# Patient Record
Sex: Female | Born: 1950 | Race: White | Hispanic: No | Marital: Single | Smoking: Former smoker
Health system: Southern US, Community
[De-identification: ages and names within clinical notes are randomized; demographics above are authoritative.]

## PROBLEM LIST (undated history)

## (undated) DIAGNOSIS — M51369 Other intervertebral disc degeneration, lumbar region without mention of lumbar back pain or lower extremity pain: Secondary | ICD-10-CM

## (undated) DIAGNOSIS — E669 Obesity, unspecified: Secondary | ICD-10-CM

## (undated) DIAGNOSIS — R319 Hematuria, unspecified: Secondary | ICD-10-CM

## (undated) DIAGNOSIS — I10 Essential (primary) hypertension: Secondary | ICD-10-CM

## (undated) DIAGNOSIS — K5730 Diverticulosis of large intestine without perforation or abscess without bleeding: Secondary | ICD-10-CM

## (undated) DIAGNOSIS — C801 Malignant (primary) neoplasm, unspecified: Secondary | ICD-10-CM

## (undated) DIAGNOSIS — R7303 Prediabetes: Secondary | ICD-10-CM

## (undated) DIAGNOSIS — N3946 Mixed incontinence: Secondary | ICD-10-CM

## (undated) DIAGNOSIS — G4733 Obstructive sleep apnea (adult) (pediatric): Secondary | ICD-10-CM

## (undated) DIAGNOSIS — M1990 Unspecified osteoarthritis, unspecified site: Secondary | ICD-10-CM

## (undated) HISTORY — PX: OTHER SURGICAL HISTORY: SHX169

## (undated) HISTORY — PX: KNEE ARTHROSCOPY: SHX127

## (undated) HISTORY — DX: Obesity, unspecified: E66.9

## (undated) HISTORY — PX: NOSE SURGERY: SHX723

## (undated) HISTORY — DX: Hematuria, unspecified: R31.9

## (undated) HISTORY — DX: Mixed incontinence: N39.46

## (undated) HISTORY — PX: TOE SURGERY: SHX1073

## (undated) HISTORY — DX: Other intervertebral disc degeneration, lumbar region without mention of lumbar back pain or lower extremity pain: M51.369

## (undated) HISTORY — DX: Obstructive sleep apnea (adult) (pediatric): G47.33

## (undated) HISTORY — DX: Essential (primary) hypertension: I10

## (undated) HISTORY — PX: COLONOSCOPY: SHX174

## (undated) HISTORY — PX: HAND TENDON SURGERY: SHX663

## (undated) HISTORY — DX: Diverticulosis of large intestine without perforation or abscess without bleeding: K57.30

## (undated) HISTORY — PX: POLYPECTOMY: SHX149

## (undated) HISTORY — DX: Unspecified osteoarthritis, unspecified site: M19.90

---
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
## 1999-09-18 ENCOUNTER — Encounter: Admission: RE | Admit: 1999-09-18 | Discharge: 1999-09-18 | Payer: Self-pay | Admitting: Family Medicine

## 1999-09-18 ENCOUNTER — Encounter: Payer: Self-pay | Admitting: Family Medicine

## 2000-11-22 ENCOUNTER — Ambulatory Visit (HOSPITAL_BASED_OUTPATIENT_CLINIC_OR_DEPARTMENT_OTHER): Admission: RE | Admit: 2000-11-22 | Discharge: 2000-11-22 | Payer: Self-pay | Admitting: *Deleted

## 2001-03-07 ENCOUNTER — Other Ambulatory Visit: Admission: RE | Admit: 2001-03-07 | Discharge: 2001-03-07 | Payer: Self-pay | Admitting: Otolaryngology

## 2005-12-17 ENCOUNTER — Ambulatory Visit: Payer: Self-pay | Admitting: Gastroenterology

## 2005-12-23 ENCOUNTER — Ambulatory Visit: Payer: Self-pay | Admitting: Gastroenterology

## 2008-08-10 HISTORY — PX: ANKLE FUSION WITH GASTROC SLIDE: SHX5585

## 2008-11-13 ENCOUNTER — Encounter (INDEPENDENT_AMBULATORY_CARE_PROVIDER_SITE_OTHER): Payer: Self-pay | Admitting: *Deleted

## 2011-01-19 ENCOUNTER — Encounter: Payer: Self-pay | Admitting: Gastroenterology

## 2011-01-19 ENCOUNTER — Ambulatory Visit (AMBULATORY_SURGERY_CENTER): Payer: BC Managed Care – PPO | Admitting: *Deleted

## 2011-01-19 VITALS — Ht 67.0 in | Wt 300.0 lb

## 2011-01-19 DIAGNOSIS — Z86010 Personal history of colonic polyps: Secondary | ICD-10-CM

## 2011-01-19 MED ORDER — PEG-KCL-NACL-NASULF-NA ASC-C 100 G PO SOLR: ORAL | Status: DC

## 2011-01-28 ENCOUNTER — Ambulatory Visit (AMBULATORY_SURGERY_CENTER): Payer: BC Managed Care – PPO | Admitting: Gastroenterology

## 2011-01-28 ENCOUNTER — Encounter: Payer: Self-pay | Admitting: Gastroenterology

## 2011-01-28 VITALS — HR 66 | Temp 98.6°F | Resp 18 | Ht 67.0 in | Wt 290.0 lb

## 2011-01-28 DIAGNOSIS — K5730 Diverticulosis of large intestine without perforation or abscess without bleeding: Secondary | ICD-10-CM | POA: Insufficient documentation

## 2011-01-28 DIAGNOSIS — Z860100 Personal history of colon polyps, unspecified: Secondary | ICD-10-CM | POA: Insufficient documentation

## 2011-01-28 MED ORDER — SODIUM CHLORIDE 0.9 % IV SOLN: 500.0000 mL | INTRAVENOUS | Status: DC

## 2011-01-28 NOTE — Patient Instructions (Signed)
Please refer to blue and green discharge instruction sheets. 

## 2011-01-29 ENCOUNTER — Telehealth: Payer: Self-pay | Admitting: *Deleted

## 2011-01-29 NOTE — Telephone Encounter (Signed)

## 2013-05-18 ENCOUNTER — Other Ambulatory Visit: Payer: Self-pay | Admitting: Dermatology

## 2013-08-01 ENCOUNTER — Ambulatory Visit (HOSPITAL_BASED_OUTPATIENT_CLINIC_OR_DEPARTMENT_OTHER): Payer: BC Managed Care – PPO | Attending: Family Medicine | Admitting: Radiology

## 2013-08-01 VITALS — Ht 67.0 in | Wt 293.0 lb

## 2013-08-01 DIAGNOSIS — R5383 Other fatigue: Secondary | ICD-10-CM

## 2013-08-01 DIAGNOSIS — R0683 Snoring: Secondary | ICD-10-CM

## 2013-08-01 DIAGNOSIS — R0989 Other specified symptoms and signs involving the circulatory and respiratory systems: Secondary | ICD-10-CM | POA: Insufficient documentation

## 2013-08-01 DIAGNOSIS — G4733 Obstructive sleep apnea (adult) (pediatric): Secondary | ICD-10-CM | POA: Insufficient documentation

## 2013-08-01 DIAGNOSIS — R0609 Other forms of dyspnea: Secondary | ICD-10-CM | POA: Insufficient documentation

## 2013-08-05 DIAGNOSIS — G4733 Obstructive sleep apnea (adult) (pediatric): Secondary | ICD-10-CM

## 2013-08-05 DIAGNOSIS — R0609 Other forms of dyspnea: Secondary | ICD-10-CM

## 2013-08-05 DIAGNOSIS — R5381 Other malaise: Secondary | ICD-10-CM

## 2013-08-05 DIAGNOSIS — R5383 Other fatigue: Secondary | ICD-10-CM

## 2013-08-05 DIAGNOSIS — R0989 Other specified symptoms and signs involving the circulatory and respiratory systems: Secondary | ICD-10-CM

## 2013-08-05 NOTE — Sleep Study (Signed)
   NAME: Susan Frederick DATE OF BIRTH:  April 03, 1951 MEDICAL RECORD NUMBER [id number]  LOCATION: [facility]  PHYSICIAN: Anthany Thornhill D  DATE OF STUDY: 08/01/2013  SLEEP STUDY TYPE: Nocturnal Polysomnogram               REFERRING PHYSICIAN: Eartha Inch, MD  INDICATION FOR STUDY: Hypersomnia with sleep apnea   EPWORTH SLEEPINESS SCORE:   HEIGHT: 5\' 7"  (170.2 cm)  WEIGHT: 132.904 kg (293 lb)    Body mass index is 45.88 kg/(m^2).  NECK SIZE: 15 in.  MEDICATIONS: Charted for review  SLEEP ARCHITECTURE: Total sleep time 177.5 minutes with sleep efficiency 45.7%. Stage I was 7.9%, stage II 92.1%, stage is 3 and REM were absent. She slept for 30 minutes between 12:30 and 1 AM but otherwise was awake until sustained sleep finally achieved around 2:45 AM. Sleep latency 103.5 minutes, awake after sleep onset 107.5 minutes, arousal index 40.2. Bedtime medication: None.  RESPIRATORY DATA:  Apnea hypopnea index (AHI) 50 per hour. A total of 148 events were scored including 85 obstructive apneas and 63 hypopneas. All events were nonsupine. Because of markedly delayed sleep onset, protocol CPAP titration could not be applied.  OXYGEN DATA: Snoring ranged from moderate to very loud with oxygen desaturation to NAD or at 75% and mean oxygen saturation through the study of 90.6% on room air.  CARDIAC DATA: Sinus rhythm with PACs and PVCs  MOVEMENT/PARASOMNIA: No significant movement disturbance. Bathroom x2. The technician described the patient as restless through the night, with movement that did not meet criteria to be scored as significant limb movement.  IMPRESSION/ RECOMMENDATION:    1) Restless with difficulty initiating and maintaining sleep. Sustained sleep was not achieved until 2:45 AM. No bedtime medication taken.  2) Severe obstructive sleep apnea/hypopnea syndrome, AHI 50 per hour with mainly nonsupine events area moderate to sometimes very loud snoring with oxygen  desaturation to a nadir of 75% and mean oxygen saturation through the study of      90.6% on room air. 3) Because of the lack of sufficient early sleep and events, split CPAP protocol did not apply. If appropriate, consider return for dedicated CPAP titration study. 4) A previous nocturnal polysomnogram on 11/22/2000 recorded AHI of 13 per hour with body weight 225 pounds for that study.  Signed Jetty Duhamel M.D. Waymon Budge Diplomate, American Board of Sleep Medicine  ELECTRONICALLY SIGNED ON:  08/05/2013, 11:41 AM [facility] PH: [phone]   FX: [phone] ACCREDITED BY THE AMERICAN ACADEMY OF SLEEP MEDICINE

## 2013-09-28 ENCOUNTER — Ambulatory Visit (HOSPITAL_BASED_OUTPATIENT_CLINIC_OR_DEPARTMENT_OTHER): Payer: BC Managed Care – PPO | Attending: Family Medicine

## 2013-09-28 VITALS — Ht 67.0 in | Wt 278.0 lb

## 2013-09-28 DIAGNOSIS — R0989 Other specified symptoms and signs involving the circulatory and respiratory systems: Secondary | ICD-10-CM | POA: Insufficient documentation

## 2013-09-28 DIAGNOSIS — R5381 Other malaise: Secondary | ICD-10-CM | POA: Insufficient documentation

## 2013-09-28 DIAGNOSIS — E669 Obesity, unspecified: Secondary | ICD-10-CM | POA: Insufficient documentation

## 2013-09-28 DIAGNOSIS — R0609 Other forms of dyspnea: Secondary | ICD-10-CM | POA: Insufficient documentation

## 2013-09-28 DIAGNOSIS — R5383 Other fatigue: Secondary | ICD-10-CM

## 2013-09-28 DIAGNOSIS — Z9989 Dependence on other enabling machines and devices: Secondary | ICD-10-CM

## 2013-09-28 DIAGNOSIS — I10 Essential (primary) hypertension: Secondary | ICD-10-CM | POA: Insufficient documentation

## 2013-09-28 DIAGNOSIS — G4733 Obstructive sleep apnea (adult) (pediatric): Secondary | ICD-10-CM | POA: Insufficient documentation

## 2013-09-30 DIAGNOSIS — G4733 Obstructive sleep apnea (adult) (pediatric): Secondary | ICD-10-CM

## 2013-09-30 NOTE — Sleep Study (Signed)
   NAME: Susan OaksCecelia J Frederick DATE OF BIRTH:  1950/12/03 MEDICAL RECORD NUMBER [id number]  LOCATION: [facility]  PHYSICIAN: Deanne Bedgood D  DATE OF STUDY: 09/28/2013  SLEEP STUDY TYPE: Nocturnal Polysomnogram               REFERRING PHYSICIAN: Eartha InchBadger, Michael C, MD  INDICATION FOR STUDY: Hypersomnia with sleep apnea-CPAP titration  EPWORTH SLEEPINESS SCORE:   5/24 HEIGHT: 5\' 7"  (170.2 cm)  WEIGHT: 278 lb (126.1 kg)    Body mass index is 43.53 kg/(m^2).  NECK SIZE: 15 in.  MEDICATIONS: Charted for review  SLEEP ARCHITECTURE: Total sleep time 163 minutes with sleep efficiency 44.8%. Stage I was 22.4%, stage II 71.8%, stage III 0.9%, REM 4.9% of total sleep time. Sleep latency 54.5 minutes, REM latency 226 minutes, awake after sleep onset 140 minutes, arousal index 23.6, bedtime medication: Melatonin  RESPIRATORY DATA: CPAP titration protocol. CPAP was titrated to 10 CWP, AHI 4 per hour. She wore a standard [employer] nasal pillows mask with heated humidifier and an EPR of 3  OXYGEN DATA: Mild snoring, prevented at the final pressure with mean oxygen saturation 94.4% on room air  CARDIAC DATA: Sinus rhythm with PACs  MOVEMENT/PARASOMNIA: No significant movement disturbance, bathroom x1  IMPRESSION/ RECOMMENDATION:   1) Sleep onset was delayed until 1 AM, and the technician described the patient as very restless. Melatonin 3 mg was taken at 10 PM. Management for an insomnia component may be helpful in the home environment, especially while adjusting to CPAP. 2) Successful CPAP titration to 10 CWP, AHI 4 per hour. She wore a standard [employer] Pillairo nasal pillows mask with heated humidifier and an EPR of 3. Snoring was prevented and mean oxygen saturation of 94.4% on room air. 3) Baseline polysomnogram on 08/01/2013 recorded AHI of 50 per hour with body weight 293 pounds.  Signed Jetty Duhamellinton Merek Niu M.D. Waymon BudgeYOUNG,Coralee Edberg D Diplomate, American Board of Sleep  Medicine  ELECTRONICALLY SIGNED ON:  09/30/2013, 11:27 AM [facility] PH: [phone]   FX: [phone] ACCREDITED BY THE AMERICAN ACADEMY OF SLEEP MEDICINE

## 2013-11-01 ENCOUNTER — Ambulatory Visit (HOSPITAL_BASED_OUTPATIENT_CLINIC_OR_DEPARTMENT_OTHER): Payer: BC Managed Care – PPO

## 2014-10-25 ENCOUNTER — Other Ambulatory Visit: Payer: Self-pay | Admitting: Dermatology

## 2015-02-19 ENCOUNTER — Encounter: Payer: Self-pay | Admitting: Gastroenterology

## 2015-05-24 ENCOUNTER — Other Ambulatory Visit: Payer: Self-pay | Admitting: Neurosurgery

## 2015-05-24 DIAGNOSIS — S32010A Wedge compression fracture of first lumbar vertebra, initial encounter for closed fracture: Secondary | ICD-10-CM

## 2015-06-08 ENCOUNTER — Ambulatory Visit
Admission: RE | Admit: 2015-06-08 | Discharge: 2015-06-08 | Disposition: A | Discharge status: Home / Self Care | Payer: BC Managed Care – PPO | Source: Ambulatory Visit | Attending: Neurosurgery | Admitting: Neurosurgery

## 2015-06-08 DIAGNOSIS — S32010A Wedge compression fracture of first lumbar vertebra, initial encounter for closed fracture: Secondary | ICD-10-CM

## 2016-09-10 ENCOUNTER — Encounter (HOSPITAL_COMMUNITY): Payer: Self-pay

## 2016-09-10 ENCOUNTER — Emergency Department (HOSPITAL_COMMUNITY)
Admission: EM | Admit: 2016-09-10 | Discharge: 2016-09-10 | Disposition: A | Discharge status: Home / Self Care | Payer: Medicare Other | Attending: Emergency Medicine | Admitting: Emergency Medicine

## 2016-09-10 ENCOUNTER — Emergency Department (HOSPITAL_COMMUNITY): Payer: Medicare Other

## 2016-09-10 DIAGNOSIS — Z7982 Long term (current) use of aspirin: Secondary | ICD-10-CM | POA: Diagnosis not present

## 2016-09-10 DIAGNOSIS — Z87891 Personal history of nicotine dependence: Secondary | ICD-10-CM | POA: Diagnosis not present

## 2016-09-10 DIAGNOSIS — I10 Essential (primary) hypertension: Secondary | ICD-10-CM | POA: Insufficient documentation

## 2016-09-10 DIAGNOSIS — R072 Precordial pain: Secondary | ICD-10-CM | POA: Diagnosis present

## 2016-09-10 DIAGNOSIS — R079 Chest pain, unspecified: Secondary | ICD-10-CM

## 2016-09-10 LAB — CBC WITH DIFFERENTIAL/PLATELET
Basophils Absolute: 0 10*3/uL (ref 0.0–0.1)
Basophils Relative: 0 %
EOS ABS: 0 10*3/uL (ref 0.0–0.7)
EOS PCT: 0 %
HCT: 41.4 % (ref 36.0–46.0)
Hemoglobin: 13.7 g/dL (ref 12.0–15.0)
LYMPHS ABS: 1.7 10*3/uL (ref 0.7–4.0)
Lymphocytes Relative: 17 %
MCH: 30.2 pg (ref 26.0–34.0)
MCHC: 33.1 g/dL (ref 30.0–36.0)
MCV: 91.2 fL (ref 78.0–100.0)
MONOS PCT: 4 %
Monocytes Absolute: 0.4 10*3/uL (ref 0.1–1.0)
Neutro Abs: 7.7 10*3/uL (ref 1.7–7.7)
Neutrophils Relative %: 79 %
PLATELETS: 211 10*3/uL (ref 150–400)
RBC: 4.54 MIL/uL (ref 3.87–5.11)
RDW: 13 % (ref 11.5–15.5)
WBC: 9.8 10*3/uL (ref 4.0–10.5)

## 2016-09-10 LAB — BASIC METABOLIC PANEL
Anion gap: 12 (ref 5–15)
BUN: 12 mg/dL (ref 6–20)
CHLORIDE: 104 mmol/L (ref 101–111)
CO2: 21 mmol/L — ABNORMAL LOW (ref 22–32)
CREATININE: 0.72 mg/dL (ref 0.44–1.00)
Calcium: 9.2 mg/dL (ref 8.9–10.3)
GFR calc Af Amer: >60 mL/min (ref >60)
GFR calc non Af Amer: >60 mL/min (ref >60)
GLUCOSE: 116 mg/dL — AB (ref 65–99)
Potassium: 4 mmol/L (ref 3.5–5.1)
SODIUM: 137 mmol/L (ref 135–145)

## 2016-09-10 LAB — I-STAT TROPONIN, ED: TROPONIN I, POC: 0 ng/mL (ref 0.00–0.08)

## 2016-09-10 NOTE — ED Notes (Signed)
EDP at bedside  

## 2016-09-10 NOTE — Discharge Instructions (Signed)
Chest pain work up here was reassuring. No evidence of heart attack today. Please follow up with your primary care provider.

## 2016-09-10 NOTE — ED Provider Notes (Signed)
[facility]EMER[facility]ENCY DEPT Provider Note   CSN: [id number] Arrival date & time: 09/10/16  1436     History   Chief Complaint Chief Complaint  Patient presents with  . Chest Pain    HPI Susan Frederick is a 66 y.o. female.  Susan Frederick is a 66 y.o. Female who presents to the ED complaining of chest pain for the past six days. Patient reports substernal and left sided non-radiating chest pain constant for the past 6 days that is worse with coughing and touching her chest. She denies any associated shortness of breath or palpitations. Her pain is not worse with exertion. She reports she has had a cough over the past several days but this has been improving. She went to her family medicine provider today who did an EKG and gave her 325 mg of aspirin. She was directed to the emergency department from her family medicine office. Patient does report a family history of her father having an MI prior to the age of 80. She does have a history of hypertension. No personal history of MI, PE or DVT. Patient denies fevers, shortness of breath, palpitations, leg pain, leg swelling, lightheadedness, syncope, headaches, numbness, tingling, weakness, abdominal pain, nausea, or vomiting.   The history is provided by the patient and medical records. No language interpreter was used.  Chest Pain   Associated symptoms include cough. Pertinent negatives include no abdominal pain, no back pain, no dizziness, no fever, no headaches, no nausea, no numbness, no palpitations, no shortness of breath, no vomiting and no weakness.    Past Medical History:  Diagnosis Date  . Arthritis   . Hypertension     Patient Active Problem List   Diagnosis Date Noted  . Personal history of colonic polyps 01/28/2011  . Diverticulosis of colon (without mention of hemorrhage) 01/28/2011    Past Surgical History:  Procedure Laterality Date  . COLONOSCOPY    . diviated    . HAND TENDON SURGERY     right  . KNEE ARTHROSCOPY     left  . NOSE SURGERY     deviated septum  . POLYPECTOMY    . TOE SURGERY     right foot/ little toe  . WISDOM TOOTH EXTRACTION  1969    OB History    No data available       Home Medications    Prior to Admission medications   Medication Sig Start Date End Date Taking? Authorizing Provider  aspirin 81 MG tablet Take 81 mg by mouth daily.     Yes Historical Provider, MD  lisinopril (PRINIVIL,ZESTRIL) 20 MG tablet Take 20 mg by mouth daily.     Yes Historical Provider, MD  Multiple Vitamins-Minerals (MULTIVITAMIN WITH MINERALS) tablet Take 1 tablet by mouth daily.     Yes Historical Provider, MD  Multiple Vitamins-Minerals (PRESERVISION AREDS 2) CAPS Take 1 capsule by mouth every evening.   Yes Historical Provider, MD  sertraline (ZOLOFT) 50 MG tablet Take 50 mg by mouth daily.     Yes Historical Provider, MD  TURMERIC CURCUMIN PO Take 1 tablet by mouth daily as needed (for inflammation).    Yes Historical Provider, MD    Family History History reviewed. No pertinent family history.  Social History Social History  Substance Use Topics  . Smoking status: Former Games developer  . Smokeless tobacco: Never Used  . Alcohol use 0.6 oz/week    1 Glasses of wine per week     Allergies  Patient has no known allergies.   Review of Systems Review of Systems  Constitutional: Negative for chills and fever.  HENT: Positive for postnasal drip and rhinorrhea. Negative for congestion and sore throat.   Eyes: Negative for visual disturbance.  Respiratory: Positive for cough. Negative for chest tightness, shortness of breath and wheezing.   Cardiovascular: Positive for chest pain. Negative for palpitations and leg swelling.  Gastrointestinal: Negative for abdominal pain, nausea and vomiting.  Genitourinary: Negative for dysuria.  Musculoskeletal: Negative for back pain and neck pain.  Skin: Negative for rash.  Neurological: Negative for dizziness, syncope, weakness, light-headedness,  numbness and headaches.     Physical Exam Updated Vital Signs BP 143/55 (BP Location: Left Arm)   Pulse 70   Temp 98 F (36.7 C) (Oral)   Resp 21   Wt 126.1 kg   SpO2 99%   BMI 43.54 kg/m   Physical Exam  Constitutional: She appears well-developed and well-nourished. No distress.  Obese female. Nontoxic.  HENT:  Head: Normocephalic and atraumatic.  Mouth/Throat: Oropharynx is clear and moist.  Eyes: Conjunctivae are normal. Pupils are equal, round, and reactive to light. Right eye exhibits no discharge. Left eye exhibits no discharge.  Neck: Neck supple. No JVD present.  Cardiovascular: Normal rate, regular rhythm, normal heart sounds and intact distal pulses.   Bilateral radial, posterior tibialis and dorsalis pedis pulses are intact.    Pulmonary/Chest: Effort normal and breath sounds normal. No stridor. No respiratory distress. She has no wheezes. She has no rales. She exhibits tenderness.  Substernal chest is tender to palpation reproduces her chest pain.  Abdominal: Soft. There is no tenderness. There is no guarding.  Musculoskeletal: Normal range of motion. She exhibits no edema or tenderness.  No LE edema or tenderness to palpation.   Lymphadenopathy:    She has no cervical adenopathy.  Neurological: She is alert. Coordination normal.  Skin: Skin is warm and dry. Capillary refill takes less than 2 seconds. No rash noted. She is not diaphoretic. No erythema. No pallor.  Psychiatric: She has a normal mood and affect. Her behavior is normal.  Nursing note and vitals reviewed.    ED Treatments / Results  Labs (all labs ordered are listed, but only abnormal results are displayed) Labs Reviewed  BASIC METABOLIC PANEL - Abnormal; Notable for the following:       Result Value   CO2 21 (*)    Glucose, Bld 116 (*)    All other components within normal limits  CBC WITH DIFFERENTIAL/PLATELET  I-STAT TROPOININ, ED    EKG  EKG Interpretation  Date/Time:  Thursday  September 10 2016 14:48:12 EST Ventricular Rate:  62 PR Interval:    QRS Duration: 94 QT Interval:  395 QTC Calculation: 402 R Axis:   45 Text Interpretation:  Sinus rhythm Probable anteroseptal infarct, old no prior aviailable for comparison Confirmed by Lincoln Brigham [phone]) on 09/10/2016 3:06:37 PM       Radiology Dg Chest 2 View  Result Date: 09/10/2016 CLINICAL DATA:  Chest pain for 6 days. EXAM: CHEST  2 VIEW COMPARISON:  None available. FINDINGS: Cardiomediastinal silhouette is normal. Mediastinal contours appear intact. There is no evidence of focal airspace consolidation, pleural effusion or pneumothorax. Osseous structures are without acute abnormality. Mild osteoarthritic changes of the thoracic spine. Soft tissues are grossly normal. IMPRESSION: No active cardiopulmonary disease. Electronically Signed   By: Ted Mcalpine M.D.   On: 09/10/2016 17:53    Procedures Procedures (including critical care  time)  Medications Ordered in ED Medications - No data to display   Initial Impression / Assessment and Plan / ED Course  I have reviewed the triage vital signs and the nursing notes.  Pertinent labs & imaging results that were available during my care of the patient were reviewed by me and considered in my medical decision making (see chart for details).    This  is a 66 y.o. Female who presents to the ED complaining of chest pain for the past six days. Patient reports substernal and left sided non-radiating chest pain constant for the past 6 days that is worse with coughing and touching her chest. She denies any associated shortness of breath or palpitations. Her pain is not worse with exertion. She reports she has had a cough over the past several days but this has been improving. She went to her family medicine provider today who did an EKG and gave her 325 mg of aspirin. She was directed to the emergency department from her family medicine office. Patient presented with chest  pain to the ED. Patient is to be discharged with recommendation to follow up with PCP in regards to today's hospital visit. Chest pain is not likely of cardiac or pulmonary etiology due to presentation, VSS, no tracheal deviation, no JVD or new murmur, RRR, breath sounds equal bilaterally, EKG without acute abnormalities, negative troponin, and negative CXR. I have low suspicion for ACS, or PE. I see no need for delta troponin as the patient has had pain for 6 days now.  Patient has been advised to return to the ED if chest pain becomes exertional, associated with diaphoresis or nausea, radiates to left jaw/arm, worsens or becomes concerning in any way. Patient appears reliable for follow up and is agreeable to discharge. I advised the patient to follow-up with their primary care provider this week. I advised the patient to return to the emergency department with new or worsening symptoms or new concerns. The patient verbalized understanding and agreement with plan.    This patient was discussed with Dr. Madilyn Hookees who agrees with assessment and plan.      Final Clinical Impressions(s) / ED Diagnoses   Final diagnoses:  Nonspecific chest pain    New Prescriptions New Prescriptions   No medications on file     Everlene FarrierWilliam Kenya Shiraishi, PA-C 09/10/16 1849    Tilden FossaElizabeth Rees, MD 09/12/16 1444

## 2016-09-10 NOTE — ED Triage Notes (Signed)
Pt. Coming from urgent care via [facility] for chest pain x6 days. Pt. Rates pain 4/10 and worse with palpation and cough. Pt. Family cardiac hx. Pt. Given 324 ASA by urgent care. EMS reports 12 lead unremarkable.

## 2016-10-26 IMAGING — MR MR LUMBAR SPINE W/O CM
5 series · 33 of 48 positions shown · non-contrast
Comparison: Lumbar radiographs 05/01/2015 and 05/22/2015.

CLINICAL DATA: Several week history of low back pain with BILATERAL
buttock pain after a fall from a ladder. Initial encounter.

EXAM:
MRI LUMBAR SPINE WITHOUT CONTRAST
TECHNIQUE: Multiplanar, multisequence MR imaging of the lumbar spine was
performed. No intravenous contrast was administered.

[Series 4: T2 · sagittal · 4.0mm · 0.49mm/px · 5 of 12 slices shown (1 of 2)]
[im 1/12]
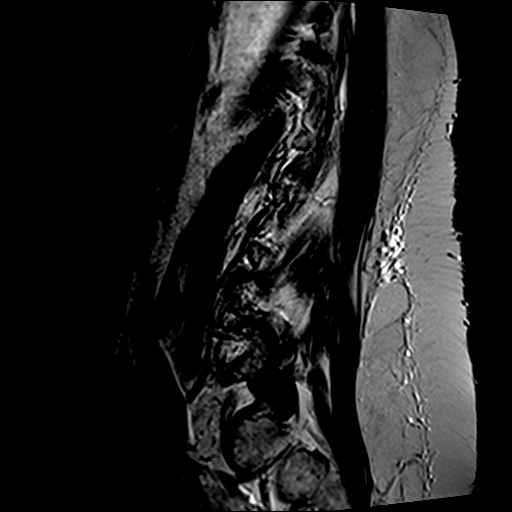
[im 3/12]
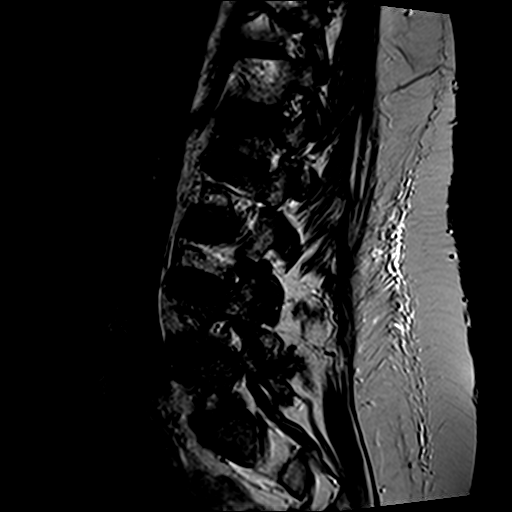
[im 6/12]
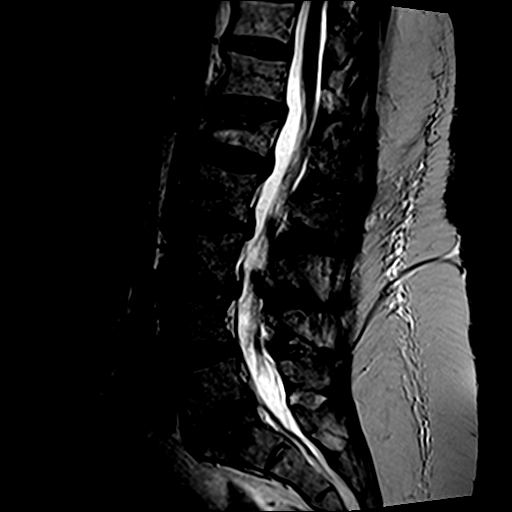
[im 9/12]
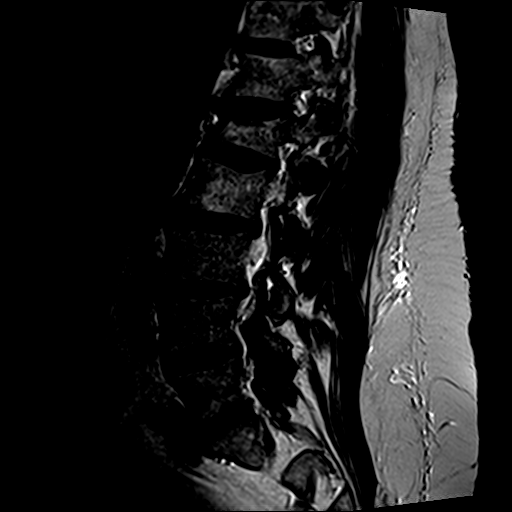
[im 12/12]
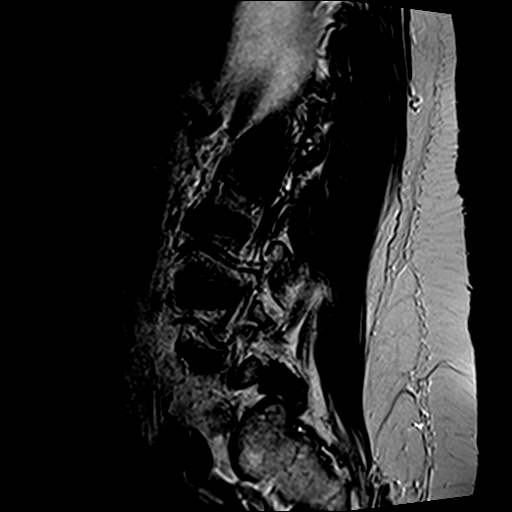

[Series 6: T1 · sagittal · 4.0mm · 0.49mm/px · 5 of 12 slices shown (1 of 2)]
[im 1/12]
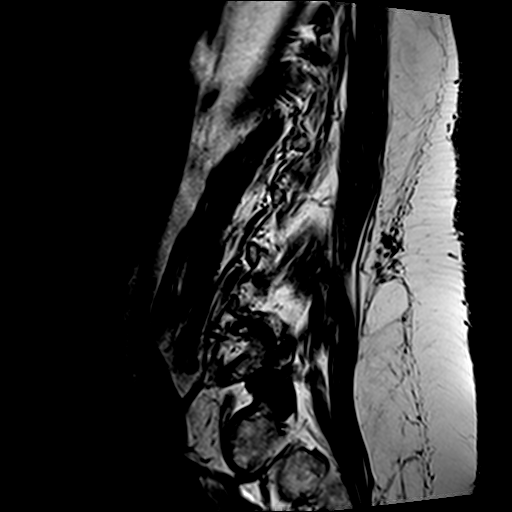
[im 3/12]
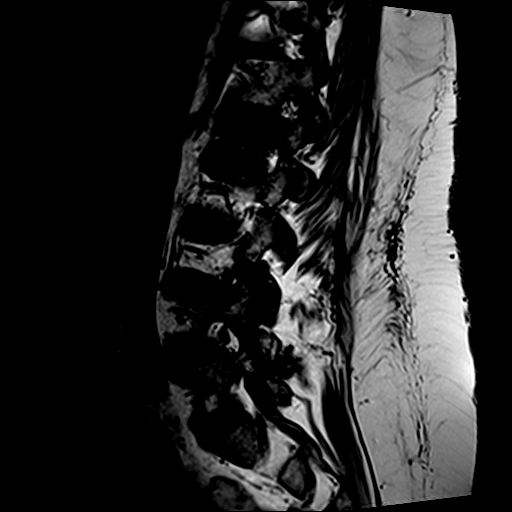
[im 6/12]
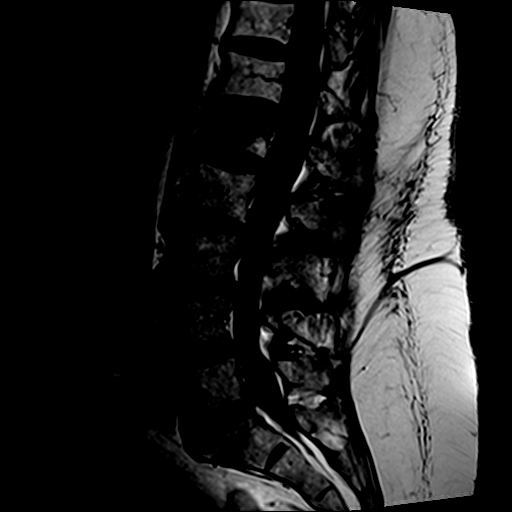
[im 9/12]
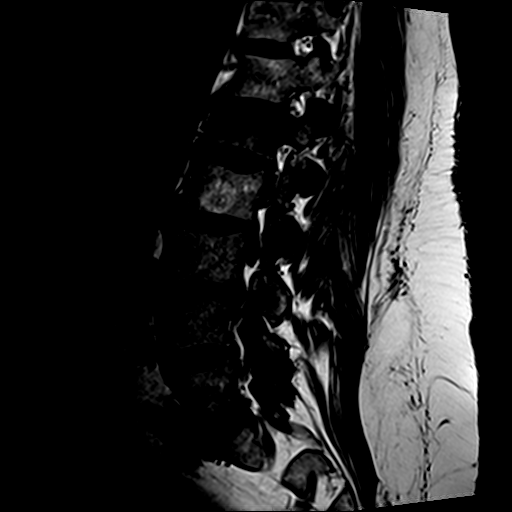
[im 12/12]
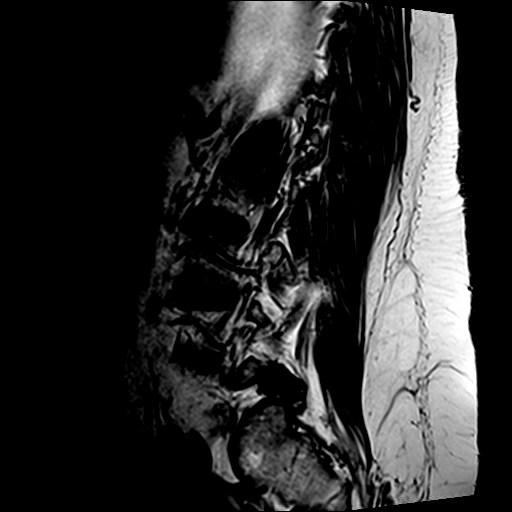

[Series 8: STIR · sagittal · 4.0mm · 0.49mm/px · 3 of 12 slices shown]
[im 1/12]
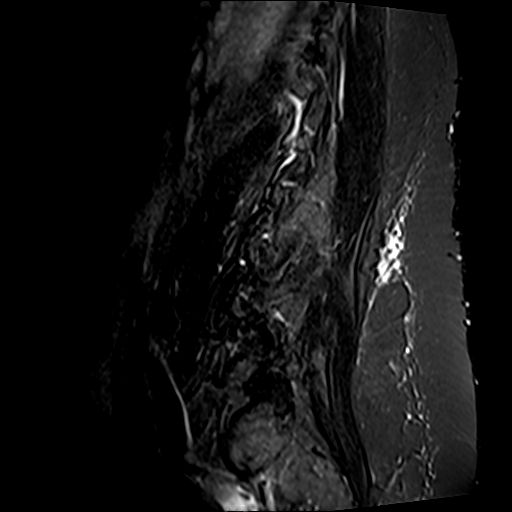
[im 3/12]
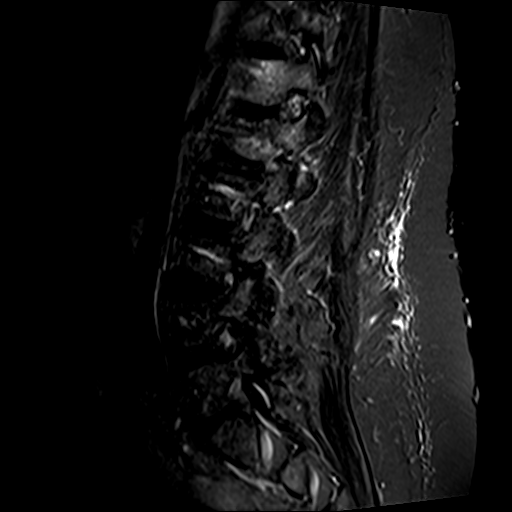
[im 5/12]
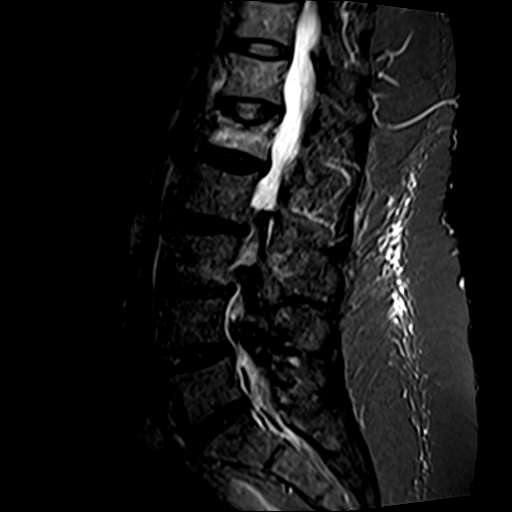

[Series 9: T2 · axial · 4.0mm · 0.74mm/px · z∈[-88,+89]mm · 10 of 34 slices shown (2 of 2)]
[im 3/34]
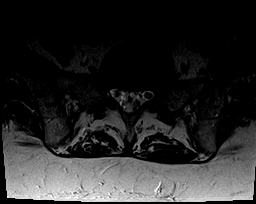
[im 5/34]
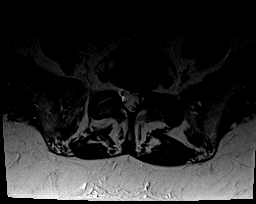
[im 7/34]
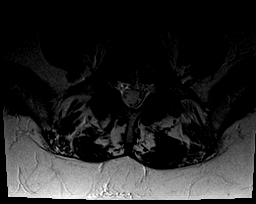
[im 12/34]
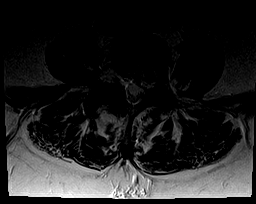
[im 16/34]
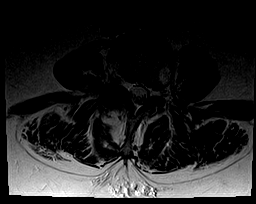
[im 18/34]
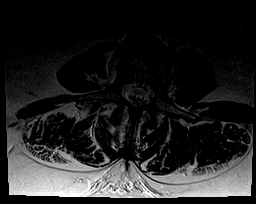
[im 20/34]
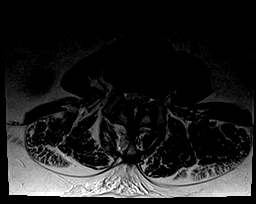
[im 25/34]
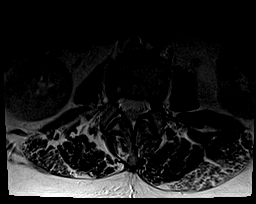
[im 29/34]
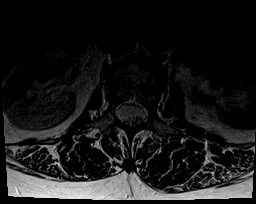
[im 34/34]
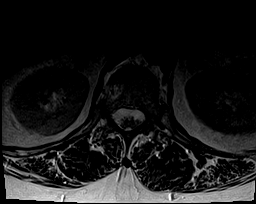

[Series 10: T1 · axial · 4.0mm · 0.74mm/px · z∈[-88,+89]mm · 10 of 34 slices shown (2 of 2)]
[im 3/34]
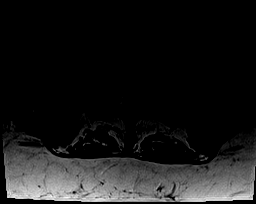
[im 5/34]
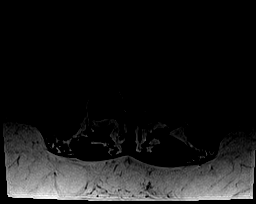
[im 7/34]
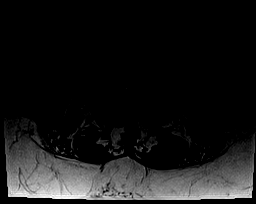
[im 12/34]
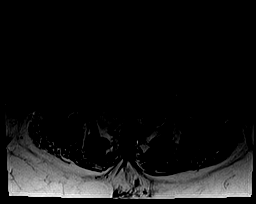
[im 16/34]
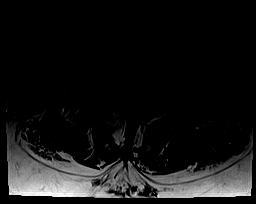
[im 18/34]
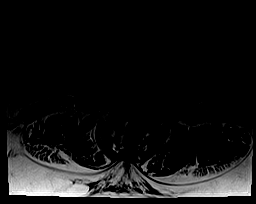
[im 20/34]
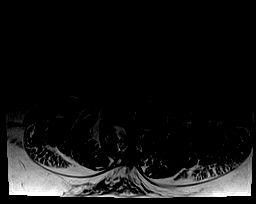
[im 25/34]
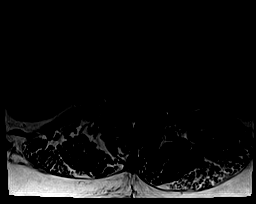
[im 29/34]
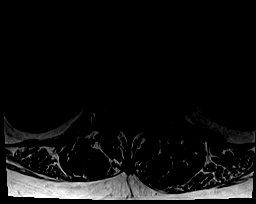
[im 34/34]
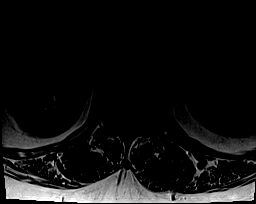

[33 of 48 positions shown; findings below may reference images not displayed]

FINDINGS: Segmentation: Normal.

Alignment:  2 mm anterolisthesis L4-5 is facet mediated.

Vertebrae: There is an acute to subacute compression fracture of L1,
with moderate bone marrow edema, loss of 50% vertebral body height
anteriorly and superiorly. Superior endplate depression. Trace
retropulsion of bone. No retroperitoneal hematoma. No features to
suggest pathologic fracture. Pedicles intact.

Conus medullaris: Normal in size, signal, and location.

Paraspinal tissues: No evidence for hydronephrosis or paravertebral
mass.

Disc levels:

T12-L1: Mild bulge. Slight superior posterior L1 endplate
displacement without significant stenosis or impingement.

L1-L2:  Shallow central protrusion.  No impingement.

L2-L3: Shallow central protrusion. Posterior element hypertrophy. No
impingement.

L3-L4: Mild bulge. Foraminal protrusion on the RIGHT. Asymmetric
facet arthropathy also on the RIGHT. RIGHT L3 nerve root impingement
is observed.

L4-L5: 2 mm anterolisthesis is facet mediated. Mild annular bulging.
BILATERAL facet arthropathy. No definite subarticular zone or
foraminal zone narrowing.

L5-S1: Central and leftward protrusion. LEFT-sided facet
arthropathy. LEFT subarticular zone narrowing potentially affects
the LEFT S1 nerve root.

Compared with prior plain films, there is slight progression
compared with 05/01/2015.
IMPRESSION: Acute to subacute L1 benign osteopenic compression fracture, with
loss of 50% vertebrae height, and minimal posterior displacement,
without significant stenosis.

RIGHT L3-4 foraminal disc protrusion and LEFT L5-S1 subarticular
disc protrusion as described above.

2 mm of facet mediated slip at L4-5 with mild annular bulging, but
no definite stenosis or neural impingement.

## 2018-01-29 IMAGING — DX DG CHEST 2V
2 series · 2 of 2 positions shown · non-contrast
Comparison: None available.

CLINICAL DATA: Chest pain for 6 days.

EXAM:
CHEST  2 VIEW

[chest pa]
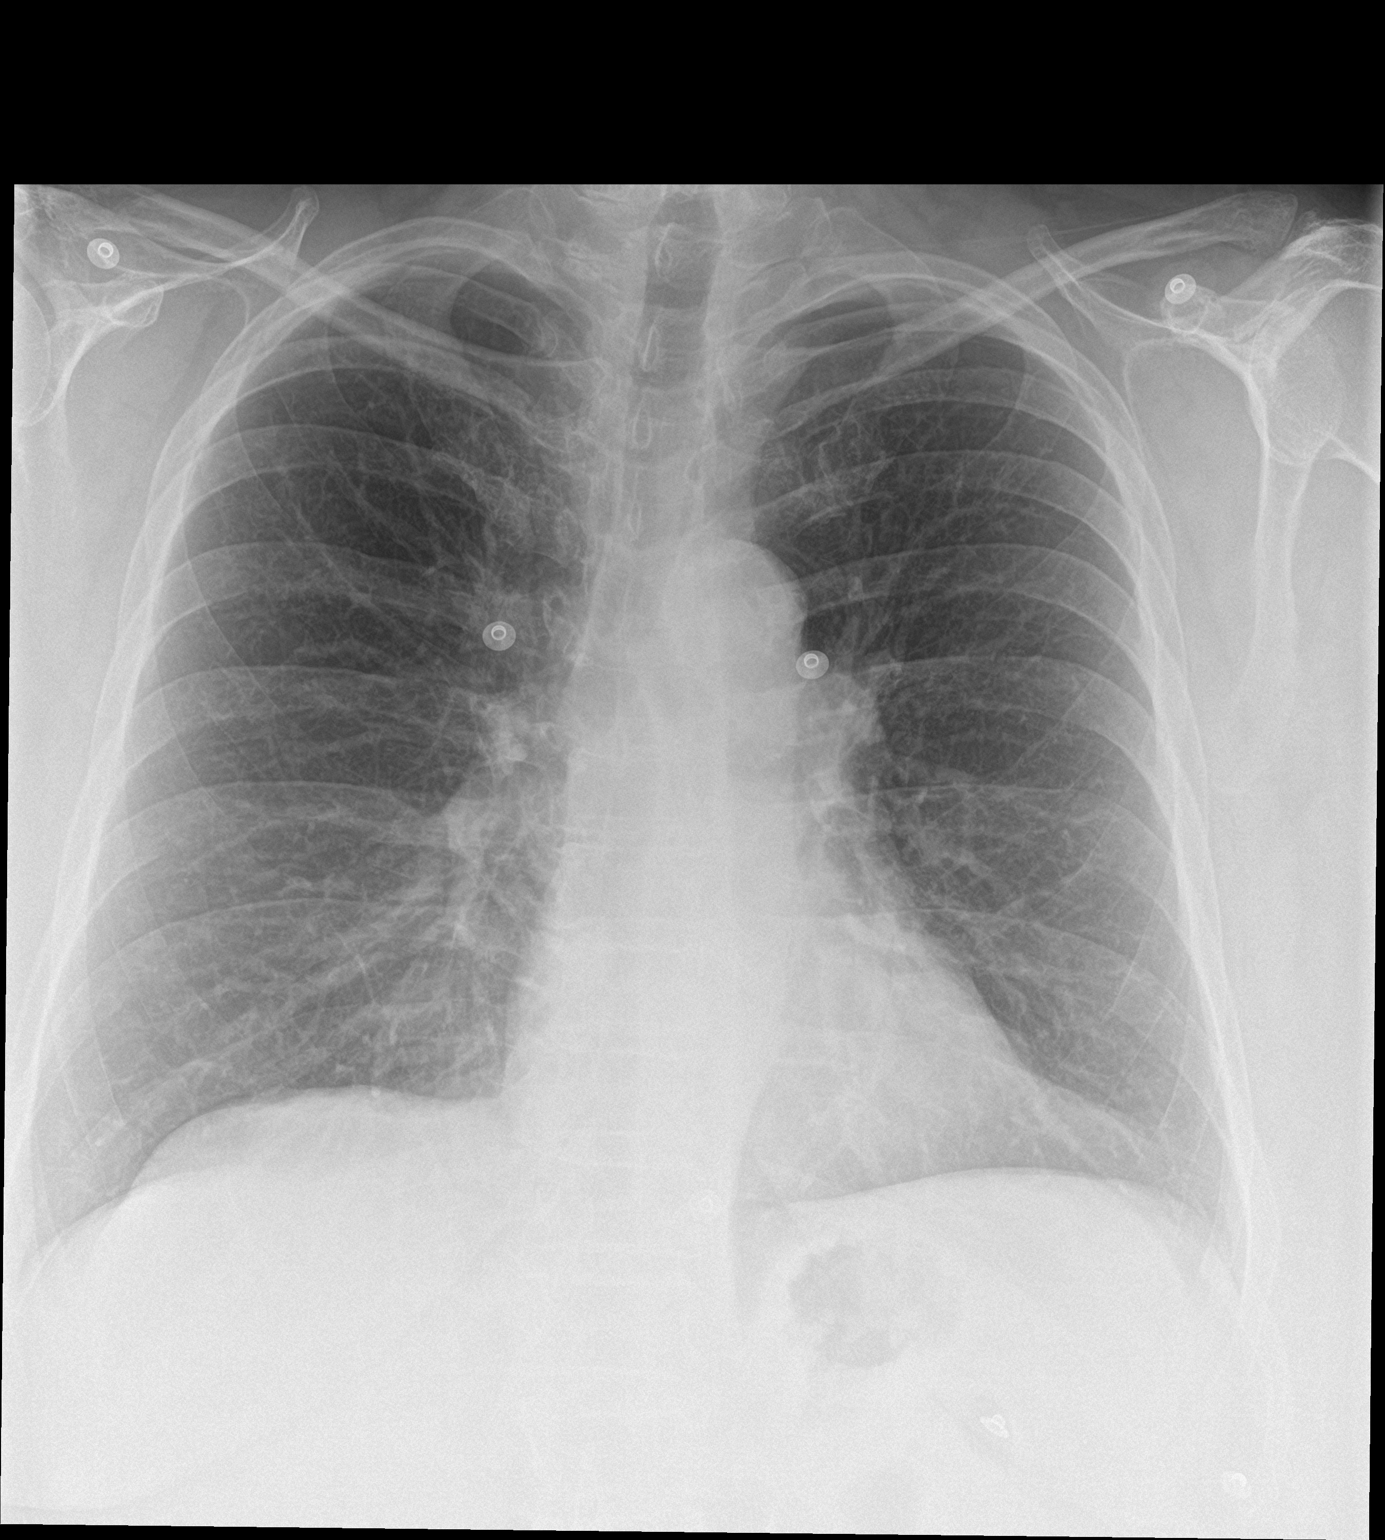

[chest lat]
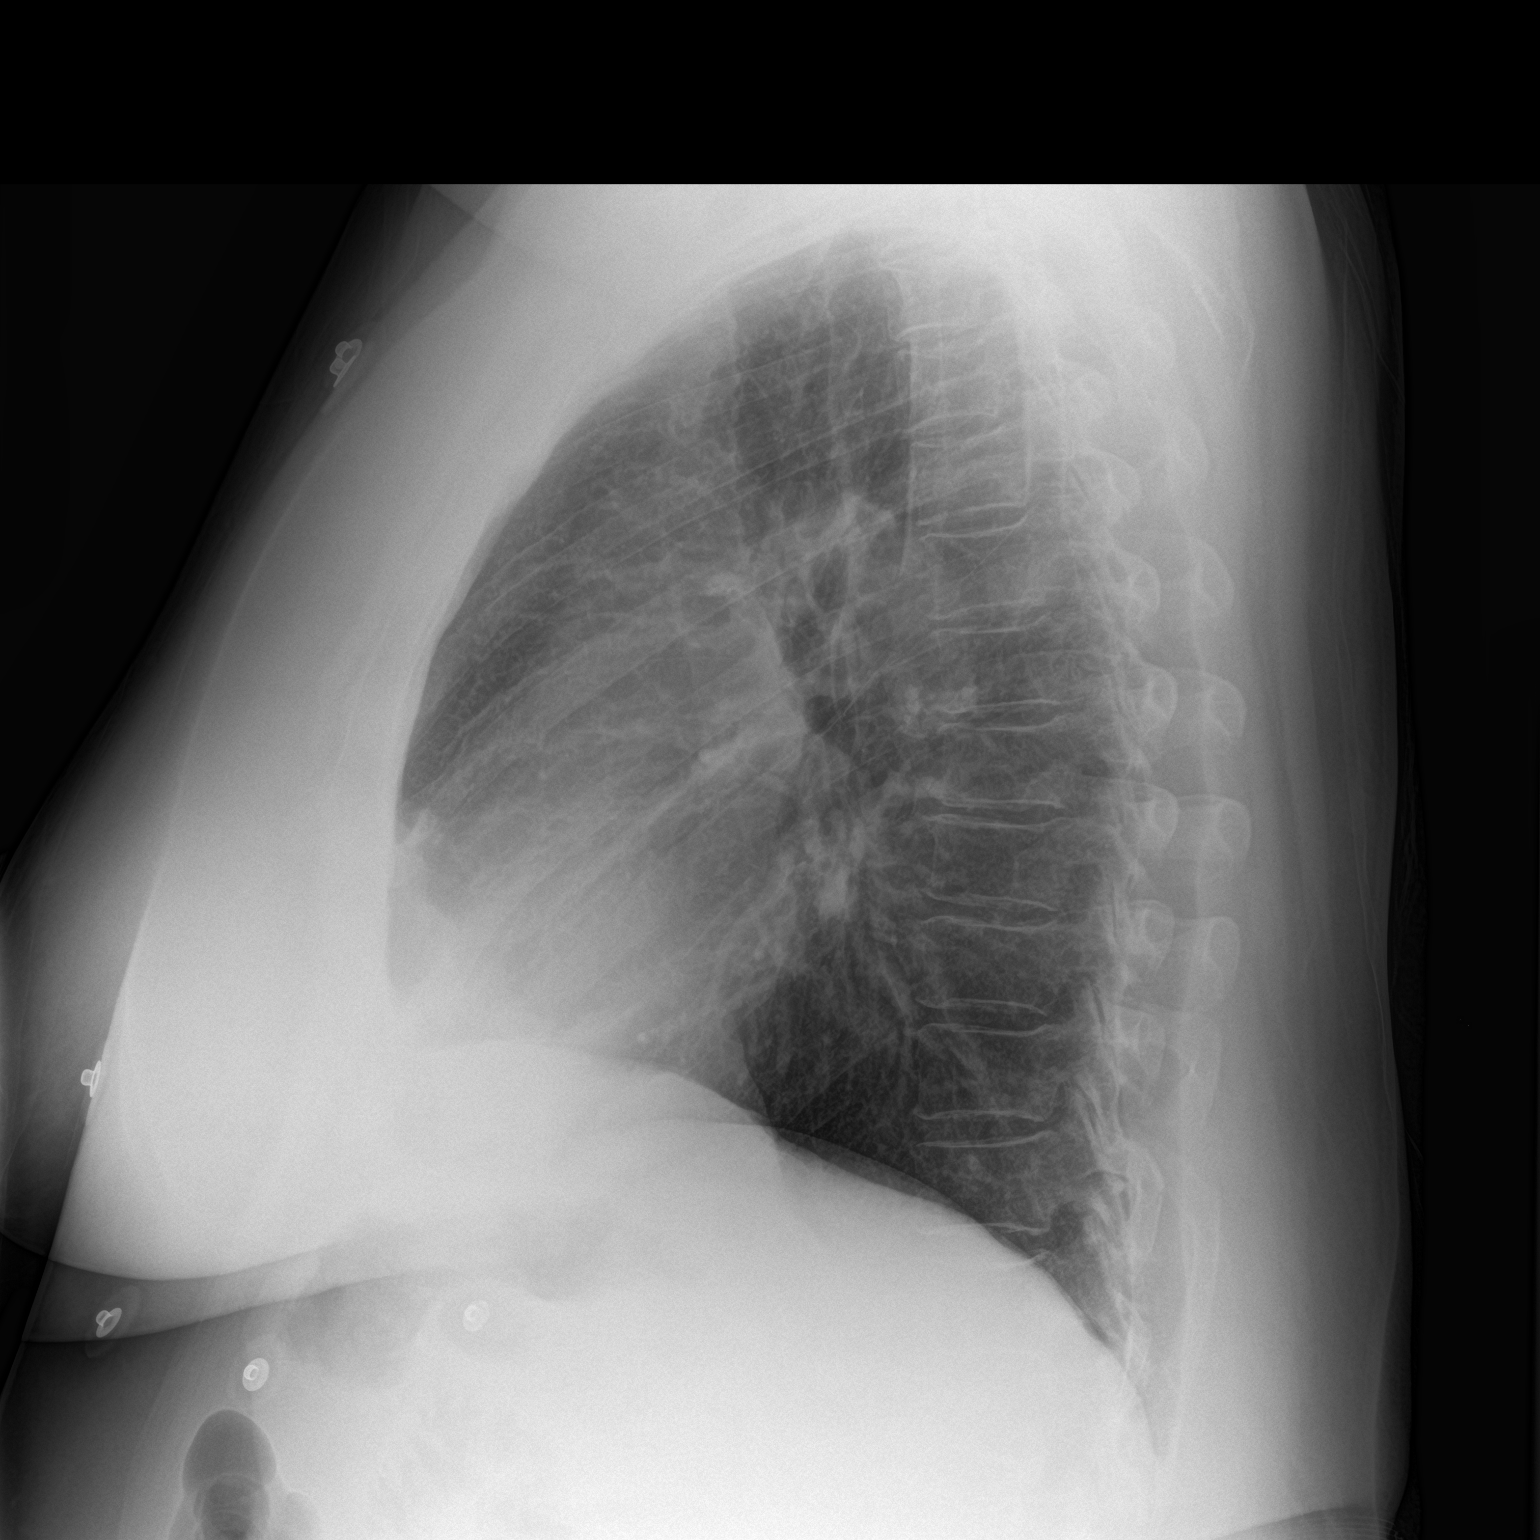

[2 of 2 positions shown; findings below may reference images not displayed]

FINDINGS: Cardiomediastinal silhouette is normal. Mediastinal contours appear
intact.

There is no evidence of focal airspace consolidation, pleural
effusion or pneumothorax.

Osseous structures are without acute abnormality. Mild
osteoarthritic changes of the thoracic spine. Soft tissues are
grossly normal.
IMPRESSION: No active cardiopulmonary disease.

## 2019-09-04 ENCOUNTER — Ambulatory Visit: Payer: Medicare PPO | Attending: Internal Medicine

## 2019-09-04 DIAGNOSIS — Z23 Encounter for immunization: Secondary | ICD-10-CM

## 2019-09-04 NOTE — Progress Notes (Signed)
   Covid-19 Vaccination Clinic  Name:  Susan Frederick    MRN: [id number] DOB: Mar 24, 1951  09/04/2019  [name] was observed post Covid-19 immunization for 15 minutes without incidence. She was provided with Vaccine Information Sheet and instruction to access the V-Safe system.   [name] was instructed to call 911 with any severe reactions post vaccine: Marland Kitchen Difficulty breathing  . Swelling of your face and throat  . A fast heartbeat  . A bad rash all over your body  . Dizziness and weakness    Immunizations Administered    Name Date Dose VIS Date Route   Pfizer COVID-19 Vaccine 09/04/2019  1:59 PM 0.3 mL 07/21/2019 Intramuscular   Manufacturer: [employer], [employer]   Lot: [id number]   NDC: [address]-6

## 2019-09-06 ENCOUNTER — Ambulatory Visit: Payer: Medicare Other

## 2019-09-25 ENCOUNTER — Ambulatory Visit: Payer: Medicare PPO | Attending: Internal Medicine

## 2019-09-25 DIAGNOSIS — Z23 Encounter for immunization: Secondary | ICD-10-CM

## 2019-09-25 NOTE — Progress Notes (Signed)
   Covid-19 Vaccination Clinic  Name:  Susan Frederick    MRN: [id number] DOB: 1951/01/16  09/25/2019  Susan Frederick was observed post Covid-19 immunization for 15 minutes without incidence. She was provided with Vaccine Information Sheet and instruction to access the V-Safe system.   Susan Frederick was instructed to call 911 with any severe reactions post vaccine: Marland Kitchen Difficulty breathing  . Swelling of your face and throat  . A fast heartbeat  . A bad rash all over your body  . Dizziness and weakness    Immunizations Administered    Name Date Dose VIS Date Route   Pfizer COVID-19 Vaccine 09/25/2019 12:23 PM 0.3 mL 07/21/2019 Intramuscular   Manufacturer: [employer], [employer]   Lot: [id number]   NDC: [address]-6

## 2021-05-13 ENCOUNTER — Encounter: Payer: Self-pay | Admitting: Gastroenterology

## 2022-07-27 LAB — COLOGUARD: COLOGUARD: NEGATIVE

## 2023-08-11 HISTORY — PX: ROOT CANAL: SHX2363

## 2024-01-07 ENCOUNTER — Telehealth: Payer: Self-pay | Admitting: *Deleted

## 2024-01-07 NOTE — Telephone Encounter (Signed)
 Call from PCP office of Dr. El Gravely at [facility]. Needs urgent referral to medical oncology for abdominal pain and malignant neoplasm--recent PET and CT scan confirms. Confirmed patient's contact information and forwarded referral to navigator.

## 2024-01-10 ENCOUNTER — Inpatient Hospital Stay
Admission: RE | Admit: 2024-01-10 | Discharge: 2024-01-10 | Disposition: A | Discharge status: Home / Self Care | Payer: Self-pay | Source: Ambulatory Visit | Attending: Physician Assistant | Admitting: Physician Assistant

## 2024-01-10 ENCOUNTER — Encounter: Payer: Self-pay | Admitting: Medical Oncology

## 2024-01-10 DIAGNOSIS — C801 Malignant (primary) neoplasm, unspecified: Secondary | ICD-10-CM

## 2024-01-11 ENCOUNTER — Inpatient Hospital Stay: Attending: Physician Assistant | Admitting: Physician Assistant

## 2024-01-11 ENCOUNTER — Encounter: Payer: Self-pay | Admitting: Physician Assistant

## 2024-01-11 ENCOUNTER — Encounter: Payer: Self-pay | Admitting: Medical Oncology

## 2024-01-11 ENCOUNTER — Inpatient Hospital Stay

## 2024-01-11 VITALS — BP 133/53 | HR 82 | Temp 97.9°F | Resp 18 | Ht 67.0 in | Wt 318.1 lb

## 2024-01-11 DIAGNOSIS — Z5111 Encounter for antineoplastic chemotherapy: Secondary | ICD-10-CM | POA: Diagnosis present

## 2024-01-11 DIAGNOSIS — R109 Unspecified abdominal pain: Secondary | ICD-10-CM | POA: Diagnosis not present

## 2024-01-11 DIAGNOSIS — G4733 Obstructive sleep apnea (adult) (pediatric): Secondary | ICD-10-CM | POA: Diagnosis not present

## 2024-01-11 DIAGNOSIS — Z801 Family history of malignant neoplasm of trachea, bronchus and lung: Secondary | ICD-10-CM | POA: Insufficient documentation

## 2024-01-11 DIAGNOSIS — I10 Essential (primary) hypertension: Secondary | ICD-10-CM | POA: Insufficient documentation

## 2024-01-11 DIAGNOSIS — Z800 Family history of malignant neoplasm of digestive organs: Secondary | ICD-10-CM | POA: Diagnosis not present

## 2024-01-11 DIAGNOSIS — N3946 Mixed incontinence: Secondary | ICD-10-CM | POA: Diagnosis not present

## 2024-01-11 DIAGNOSIS — Z87891 Personal history of nicotine dependence: Secondary | ICD-10-CM | POA: Insufficient documentation

## 2024-01-11 DIAGNOSIS — K5900 Constipation, unspecified: Secondary | ICD-10-CM | POA: Insufficient documentation

## 2024-01-11 DIAGNOSIS — M51369 Other intervertebral disc degeneration, lumbar region without mention of lumbar back pain or lower extremity pain: Secondary | ICD-10-CM | POA: Diagnosis not present

## 2024-01-11 DIAGNOSIS — E669 Obesity, unspecified: Secondary | ICD-10-CM | POA: Diagnosis not present

## 2024-01-11 DIAGNOSIS — R140 Abdominal distension (gaseous): Secondary | ICD-10-CM | POA: Diagnosis not present

## 2024-01-11 DIAGNOSIS — M1990 Unspecified osteoarthritis, unspecified site: Secondary | ICD-10-CM | POA: Diagnosis not present

## 2024-01-11 DIAGNOSIS — Z803 Family history of malignant neoplasm of breast: Secondary | ICD-10-CM | POA: Insufficient documentation

## 2024-01-11 DIAGNOSIS — Z79891 Long term (current) use of opiate analgesic: Secondary | ICD-10-CM | POA: Diagnosis not present

## 2024-01-11 DIAGNOSIS — C801 Malignant (primary) neoplasm, unspecified: Secondary | ICD-10-CM

## 2024-01-11 DIAGNOSIS — R634 Abnormal weight loss: Secondary | ICD-10-CM | POA: Diagnosis not present

## 2024-01-11 DIAGNOSIS — Z7982 Long term (current) use of aspirin: Secondary | ICD-10-CM | POA: Insufficient documentation

## 2024-01-11 DIAGNOSIS — C579 Malignant neoplasm of female genital organ, unspecified: Secondary | ICD-10-CM | POA: Insufficient documentation

## 2024-01-11 DIAGNOSIS — Z79899 Other long term (current) drug therapy: Secondary | ICD-10-CM | POA: Insufficient documentation

## 2024-01-11 DIAGNOSIS — C786 Secondary malignant neoplasm of retroperitoneum and peritoneum: Secondary | ICD-10-CM | POA: Insufficient documentation

## 2024-01-11 DIAGNOSIS — R971 Elevated cancer antigen 125 [CA 125]: Secondary | ICD-10-CM | POA: Insufficient documentation

## 2024-01-11 DIAGNOSIS — K5909 Other constipation: Secondary | ICD-10-CM | POA: Diagnosis not present

## 2024-01-11 DIAGNOSIS — R630 Anorexia: Secondary | ICD-10-CM | POA: Diagnosis not present

## 2024-01-11 DIAGNOSIS — Z8049 Family history of malignant neoplasm of other genital organs: Secondary | ICD-10-CM | POA: Diagnosis not present

## 2024-01-11 DIAGNOSIS — Z5189 Encounter for other specified aftercare: Secondary | ICD-10-CM | POA: Diagnosis not present

## 2024-01-11 DIAGNOSIS — R591 Generalized enlarged lymph nodes: Secondary | ICD-10-CM | POA: Insufficient documentation

## 2024-01-11 DIAGNOSIS — R1084 Generalized abdominal pain: Secondary | ICD-10-CM

## 2024-01-11 DIAGNOSIS — Z6841 Body Mass Index (BMI) 40.0 and over, adult: Secondary | ICD-10-CM | POA: Insufficient documentation

## 2024-01-11 LAB — CMP (CANCER CENTER ONLY)
ALT: 8 U/L (ref 0–44)
AST: 12 U/L — ABNORMAL LOW (ref 15–41)
Albumin: 3.7 g/dL (ref 3.5–5.0)
Alkaline Phosphatase: 82 U/L (ref 38–126)
Anion gap: 10 (ref 5–15)
BUN: 11 mg/dL (ref 8–23)
CO2: 26 mmol/L (ref 22–32)
Calcium: 9.6 mg/dL (ref 8.9–10.3)
Chloride: 102 mmol/L (ref 98–111)
Creatinine: 0.7 mg/dL (ref 0.44–1.00)
GFR, Estimated: >60 mL/min (ref >60)
Glucose, Bld: 135 mg/dL — ABNORMAL HIGH (ref 70–99)
Potassium: 4.5 mmol/L (ref 3.5–5.1)
Sodium: 138 mmol/L (ref 135–145)
Total Bilirubin: 0.4 mg/dL (ref 0.0–1.2)
Total Protein: 7.4 g/dL (ref 6.5–8.1)

## 2024-01-11 LAB — CBC WITH DIFFERENTIAL (CANCER CENTER ONLY)
Abs Immature Granulocytes: 0.08 10*3/uL — ABNORMAL HIGH (ref 0.00–0.07)
Basophils Absolute: 0.1 10*3/uL (ref 0.0–0.1)
Basophils Relative: 1 %
Eosinophils Absolute: 0.1 10*3/uL (ref 0.0–0.5)
Eosinophils Relative: 1 %
HCT: 37.2 % (ref 36.0–46.0)
Hemoglobin: 12.5 g/dL (ref 12.0–15.0)
Immature Granulocytes: 1 %
Lymphocytes Relative: 12 %
Lymphs Abs: 1 10*3/uL (ref 0.7–4.0)
MCH: 29.6 pg (ref 26.0–34.0)
MCHC: 33.6 g/dL (ref 30.0–36.0)
MCV: 87.9 fL (ref 80.0–100.0)
Monocytes Absolute: 0.7 10*3/uL (ref 0.1–1.0)
Monocytes Relative: 8 %
Neutro Abs: 7 10*3/uL (ref 1.7–7.7)
Neutrophils Relative %: 77 %
Platelet Count: 310 10*3/uL (ref 150–400)
RBC: 4.23 MIL/uL (ref 3.87–5.11)
RDW: 12.6 % (ref 11.5–15.5)
WBC Count: 8.9 10*3/uL (ref 4.0–10.5)
nRBC: 0 % (ref 0.0–0.2)

## 2024-01-11 MED ORDER — OXYCODONE HCL 5 MG PO TABS: 5.0000 mg | ORAL_TABLET | Freq: Four times a day (QID) | ORAL | 0 refills | Status: DC | PRN

## 2024-01-11 MED ORDER — SENNA 8.6 MG PO TABS: 2.0000 | ORAL_TABLET | Freq: Every day | ORAL | 0 refills | Status: DC

## 2024-01-11 NOTE — Progress Notes (Signed)
 [facility]  Patient presented to clinic alone for her scheduled appointment with Irene Thayil, PA-C. I introduced myself and provided patient with my direct contact information. Patient again expressed her concern for the time it took for scheduling appointment from referral. Patient was informed referral went out on June 22nd. I informed patient that referral was received by us  end of afternoon on June 30th and the fax face sheet also is stamped as sent out on the June 30th. Patient was called Monday, June 2nd and scheduled for office visit with [facility] for June 3rd. Patient expressed her understanding. Patient thanked and was encouraged to call me with any questions/concerns she may have.  Esperanza Hedges, RN, BSN, [facility] Oncology Nurse Navigator, [facility] 01/11/2024 12:52 PM

## 2024-01-11 NOTE — Patient Instructions (Signed)
 Diagnostic Clinic Office Visit Discharge Information and Instructions  Thank you for choosing [facility] for your healthcare needs.  Below is a summary of today's discussion, along with our contact information and an outline of what to expect next.  Reason for Visit:  Peritoneal carcinomatosis and lymphadenopathy  Proposed Diagnostic Care Plan: CT guided biopsy Labs   What to Expect: - Generally, when lab tests are ordered the results can take up to 1 week for results to be available.  At that point, we will contact you to discuss your results with you.  Unless there is a critical result, we will typically wait for all of your lab results to be available before contacting you. - If a biopsy is part of your Care Plan, those results can take on average 7-10 days to result.  Once results are available, we will contact you to discuss your pathology results and any next steps. - If you have additional imaging ordered, such as a CT Scan, MRI, Ultrasound, Bone Scan, or PET scan, your imaging will need to be authorized then scheduled with the earliest available appointment.  You may be asked to travel to another hospital within [facility] who has a sooner availability, please consider doing so if asked. - If you use MyChart, your results will be available to you in the MyChart portal.  Your provider will be in touch with you as soon as all of your results are available to be discussed.  Your Diagnostic Clinic Provider:  Wyline Hearing PA-C and Dr. Amparo Balk, contact number [phone] Your Diagnostic Navigator:  Jetta Morrow RN, contact number [phone]  If you or your caregiver have number blocking on your cell phones, please ensure the cancer center's numbers are not blocked.  If you are not a registered MyChart user, please consider enrolling in MyChart to receive your test results and visit notes.  You can also access your discharge instructions electronically.  MyChart also gives you an  electronic means to communicate with your Care Team instead of needing to call in to the cancer center.  We appreciate you trusting us  with your healthcare and look forward to partnering with you as we work to uncover what your potential diagnosis may be.  Please do not hesitate to reach out at any point with questions or concerns.

## 2024-01-11 NOTE — Progress Notes (Signed)
 PROCEDURE / BIOPSY REVIEW Date: 01/11/24  Requested Biopsy site: Peritoneal mets Reason for request: Diffuse met Dx Imaging review: Best seen on OSH PET CT on PACS  Decision: Approved Imaging modality to perform: CT Schedule with: Moderate Sedation Schedule for: Any VIR  Additional comments:  [facility]) Pt. Expedite request per referring provider. @VIR : Diffuse mets, No Dx. Diffuse omental mets.   Please contact me with questions, concerns, or if issue pertaining to this request arise.  Art Largo, MD [facility] Radiology  10:28 IT Darilyn Edin, PA-C

## 2024-01-11 NOTE — Progress Notes (Signed)
 [facility] [phone]   [phone]  INITIAL CONSULTATION:  Patient Care Team: Gerrianne Krauss, Kirby Peoples as PCP - General (Physician Assistant)  CHIEF COMPLAINTS/PURPOSE OF CONSULTATION:  Peroneal carcinomatosis with lymphadenopathy  ONCOLOGIC HISTORY: 12/17/2023: Presented to PCP with ongoing abdominal pain, bloating and dysuria.  12/27/2023: CT abdomen/pelvis:  Extensive abnormal diffuse stranding and nodularity involving the omentum and mesentery. Small amount of ascites in the abdomen and pelvis, possibly malignant ascites. Small periumbilical hernia with associated soft tissue density nodularity. Enlarged aortocaval retroperitoneal node.  The ovaries are difficult to visualize, but appear grossly unremarkable.  Moderate amount of stool, primarily in the cecum and right colon, limiting evaluation. 01/05/2024: PET/CT scan showed hypermetabolic lymph nodes int he mediastinum, bilateral internal mammary chains and right pericardial region. Diffuse peritoneal carcinomatosis, small associated ascites, small hypermetabolic retroperitoneal abdominal nodes.  01/11/2024: Establish care with [facility]   HISTORY OF PRESENTING ILLNESS:  Susan Frederick 73 y.o. female with medical history significant for arthritis, OSA on CPAP, hypertension, DDD and obesity presents to the rapid diagnostic clinic for evaluation of abnormal CT and PET/CT imaging concerning for malignancy. She is unaccompanied for this visit.   On exam today, Ms. Schwalb reports she has noticed decreased energy levels and contributes this to abdominal pain with ambulation and depression. She is able to complete all her daily activities on her own. She reports decreased PO intake eating only approximately 600 calories per day. She is generally not a big breakfast eater but now only eats a small portion for lunch and dinner. She adds that she gets full easier. She has post  prandial abdominal pain the suprapubic region. In addition, she notes abdominal distention especially in the epigastric region. She has new onset constipation since last month. She notes decreased volume of stool with each bowel movement. She requires taking miralax daily to help with a bowel movement. She is passing gas daily. She denies easy bruising or overt signs of bleeding. She denies fevers, chills, sweats, shortness of breath, chest pain or cough. She has no other complaints. Rest of the 10 point ROS is below.   MEDICAL HISTORY:  Past Medical History:  Diagnosis Date   Arthritis    DDD (degenerative disc disease), lumbar    Diverticulosis of colon    Hematuria    Hypertension    Mixed stress and urge urinary incontinence    Obesity    OSA on CPAP     SURGICAL HISTORY: Past Surgical History:  Procedure Laterality Date   COLONOSCOPY     diviated     HAND TENDON SURGERY     right   KNEE ARTHROSCOPY     left   NOSE SURGERY     deviated septum   POLYPECTOMY     TOE SURGERY     right foot/ little toe   WISDOM TOOTH EXTRACTION  1969    SOCIAL HISTORY: Social History   Socioeconomic History   Marital status: Single    Spouse name: Not on file   Number of children: Not on file   Years of education: Not on file   Highest education level: Not on file  Occupational History   Not on file  Tobacco Use   Smoking status: Former   Smokeless tobacco: Never  Substance and Sexual Activity   Alcohol use: Yes    Alcohol/week: 1.0 standard drink of alcohol    Types: 1 Glasses of wine per week   Drug  use: No   Sexual activity: Not on file  Other Topics Concern   Not on file  Social History Narrative   Not on file   Social Drivers of Health   Financial Resource Strain: Low Risk  (12/18/2023)   Received from [facility]   Overall Financial Resource Strain (CARDIA)    Difficulty of Paying Living Expenses: Not hard at all  Food Insecurity: No Food Insecurity (12/18/2023)    Received from [facility]   Hunger Vital Sign    Worried About Running Out of Food in the Last Year: Never true    Ran Out of Food in the Last Year: Never true  Transportation Needs: No Transportation Needs (12/18/2023)   Received from [facility] - Transportation    Lack of Transportation (Medical): No    Lack of Transportation (Non-Medical): No  Physical Activity: Inactive (12/18/2023)   Received from [facility]   Exercise Vital Sign    Days of Exercise per Week: 0 days    Minutes of Exercise per Session: 60 min  Stress: No Stress Concern Present (12/18/2023)   Received from [facility] - Occupational Stress Questionnaire    Feeling of Stress : Not at all  Social Connections: Moderately Integrated (12/18/2023)   Received from [facility]   Social Network    How would you rate your social network (family, work, friends)?: Adequate participation with social networks  Intimate Partner Violence: Not At Risk (12/18/2023)   Received from [facility]   HITS    Over the last 12 months how often did your partner physically hurt you?: Never    Over the last 12 months how often did your partner insult you or talk down to you?: Never    Over the last 12 months how often did your partner threaten you with physical harm?: Never    Over the last 12 months how often did your partner scream or curse at you?: Never    FAMILY HISTORY: Family History  Problem Relation Age of Onset   Endometrial cancer Sister    Breast cancer Maternal Grandmother    Throat cancer Maternal Grandfather    Lung cancer Maternal Aunt     ALLERGIES:  has no known allergies.  MEDICATIONS:  Current Outpatient Medications  Medication Sig Dispense Refill   Boswellia-Glucosamine-Vit D (OSTEO BI-FLEX ONE PER DAY PO) Take by mouth.     lisinopril (PRINIVIL,ZESTRIL) 20 MG tablet Take 20 mg by mouth daily.       Multiple Vitamins-Minerals (MULTIVITAMIN WITH  MINERALS) tablet Take 1 tablet by mouth daily.  Centrum Womens Silver     Multiple Vitamins-Minerals (PRESERVISION AREDS 2) CAPS Take 1 capsule by mouth every evening.     oxyCODONE  (OXY IR/ROXICODONE ) 5 MG immediate release tablet Take 1-2 tablets (5-10 mg total) by mouth every 6 (six) hours as needed for severe pain (pain score 7-10). 30 tablet 0   senna (SENOKOT) 8.6 MG TABS tablet Take 2 tablets (17.2 mg total) by mouth daily. 120 tablet 0   sertraline (ZOLOFT) 50 MG tablet Take 50 mg by mouth daily.       aspirin 81 MG tablet Take 81 mg by mouth daily.       TURMERIC CURCUMIN PO Take 1 tablet by mouth daily as needed (for inflammation).      Current Facility-Administered Medications  Medication Dose Route Frequency Provider Last Rate Last Admin   0.9 %  sodium  chloride infusion  500 mL Intravenous Continuous Tessa Figures, MD        REVIEW OF SYSTEMS:   Constitutional: ( - ) fevers, ( - )  chills , ( - ) night sweats Eyes: ( - ) blurriness of vision, ( - ) double vision, ( - ) watery eyes Ears, nose, mouth, throat, and face: ( - ) mucositis, ( - ) sore throat Respiratory: ( - ) cough, ( - ) dyspnea, ( - ) wheezes Cardiovascular: ( - ) palpitation, ( - ) chest discomfort, ( - ) lower extremity swelling Gastrointestinal:  ( - ) nausea, ( - ) heartburn, ( - ) change in bowel habits Skin: ( - ) abnormal skin rashes Lymphatics: ( - ) new lymphadenopathy, ( - ) easy bruising Neurological: ( - ) numbness, ( - ) tingling, ( - ) new weaknesses Behavioral/Psych: ( - ) mood change, ( - ) new changes  All other systems were reviewed with the patient and are negative.  PHYSICAL EXAMINATION: ECOG PERFORMANCE STATUS: 1 - Symptomatic but completely ambulatory  Vitals:   01/11/24 0909 01/11/24 0911  BP: (!) 147/61 (!) 133/53  Pulse: 82   Resp: 18   Temp: 97.9 F (36.6 C)   SpO2: 95%    Filed Weights   01/11/24 0909  Weight: (!) 318 lb 1.6 oz (144.3 kg)    GENERAL: well appearing  female in NAD  SKIN: skin color, texture, turgor are normal, no rashes or significant lesions EYES: conjunctiva are pink and non-injected, sclera clear OROPHARYNX: no exudate, no erythema; lips, buccal mucosa, and tongue normal  NECK: supple, non-tender LYMPH:  no palpable lymphadenopathy in the cervical, axillary or supraclavicular lymph nodes.  LUNGS: clear to auscultation and percussion with normal breathing effort HEART: regular rate & rhythm and no murmurs and no lower extremity edema ABDOMEN: normal bowel sounds.Diffuse distention. Tenderness of palpation diffuse but more in the suprapubic and epigastric regions. No palpable hepatosplenomegaly. No palpable nodularity.  Musculoskeletal: no cyanosis of digits and no clubbing  PSYCH: alert & oriented x 3, fluent speech NEURO: no focal motor/sensory deficits  LABORATORY DATA:  I have reviewed the data as listed    Latest Ref Rng & Units 01/11/2024   10:30 AM 09/10/2016    3:35 PM  CBC  WBC 4.0 - 10.5 K/uL 8.9  9.8   Hemoglobin 12.0 - 15.0 g/dL 59.5  63.8   Hematocrit 36.0 - 46.0 % 37.2  41.4   Platelets 150 - 400 K/uL 310  211        Latest Ref Rng & Units 01/11/2024   10:30 AM 09/10/2016    3:35 PM  CMP  Glucose 70 - 99 mg/dL 756  433   BUN 8 - 23 mg/dL 11  12   Creatinine 2.95 - 1.00 mg/dL 1.88  4.16   Sodium 606 - 145 mmol/L 138  137   Potassium 3.5 - 5.1 mmol/L 4.5  4.0   Chloride 98 - 111 mmol/L 102  104   CO2 22 - 32 mmol/L 26  21   Calcium 8.9 - 10.3 mg/dL 9.6  9.2   Total Protein 6.5 - 8.1 g/dL 7.4    Total Bilirubin 0.0 - 1.2 mg/dL 0.4    Alkaline Phos 38 - 126 U/L 82    AST 15 - 41 U/L 12    ALT 0 - 44 U/L 8       RADIOGRAPHIC STUDIES: I have personally reviewed the radiological images  as listed and agreed with the findings in the report. No results found.  ASSESSMENT & PLAN MAYGEN SIRICO is a 73 y.o. female who presents to the rapid diagnostic clinic for evaluation of abnormal CT and PET/CT scan concerning for  metastatic disease.   #Peritoneal carcinomatosis #Multifocal lymphadenopathy --Labs today to check CBC, CMP, CEA, CA125 and CA19-9 --Scheduled for CT guided omental biopsy on 01/18/2024 --Patient requests to transfer to [facility] if there is a cancer diagnosis.  --Plan to follow up wit patient once workup is complete.  #Abdominal pain: --Likely secondary to peritoneal carcinomatosis --Patient reports tylenol is ineffective and recommend to avoid NSAIDs to minimize risk of bleeding --Sent prescription for oxycodone  5-10 mg q 6 hours as needed  #Constipation: --Likely secondary to peritoneal carcinomatosis --No evidence of bowel obstruction but did give precautions including looking for signs of nausea/vomiting not having a bowel movement or passing gas --Continue with miralax daily and add senakot daily.    #Age appropriate screenings: --Most recent mammogram was Sept 2024, unremarkable --Most recent cologuard was 07/2022, unremarkable  Orders Placed This Encounter  Procedures   CT ABDOMINAL MASS BIOPSY    Standing Status:   Future    Expected Date:   01/18/2024    Expiration Date:   01/10/2025    Lab orders requested (DO NOT place separate lab orders, these will be automatically ordered during procedure specimen collection)::   Surgical Pathology    Reason for Exam (SYMPTOM  OR DIAGNOSIS REQUIRED):   peritoneal disease seen on CT and PET scan    Preferred location?:   [facility]   CBC with Differential (Cancer Center Only)    Standing Status:   Future    Number of Occurrences:   1    Expiration Date:   01/10/2025   CMP (Cancer Center only)    Standing Status:   Future    Number of Occurrences:   1    Expiration Date:   01/10/2025   CA 19.9    Standing Status:   Future    Number of Occurrences:   1    Expiration Date:   01/10/2025   CA 125    Standing Status:   Future    Number of Occurrences:   1    Expiration Date:   01/10/2025   CEA (Access)-[facility] ONLY    Standing  Status:   Future    Number of Occurrences:   1    Expiration Date:   01/10/2025    All questions were answered. The patient knows to call the clinic with any problems, questions or concerns.  I have spent a total of 60 minutes minutes of face-to-face and non-face-to-face time, preparing to see the patient, obtaining and/or reviewing separately obtained history, performing a medically appropriate examination, counseling and educating the patient, ordering medications/tests/procedures, referring and communicating with other health care professionals, documenting clinical information in the electronic health record, independently interpreting results and communicating results to the patient, and care coordination.   Wyline Hearing, PA-C Department of Hematology/Oncology [facility] at [facility] Phone: [phone]  Patient was seen with Dr. Rosaline Coma  I have read the above note and personally examined the patient. I agree with the assessment and plan as noted above.  Briefly Mrs. Rashad Obeid is a 73 year old female who presents for evaluation of peritoneal carcinomatosis, concerning for GYN primary.  She underwent CT imaging of her abdomen on 12/27/2023 which showed extensive abnormal diffuse  stranding and nodularity involving the omentum and mesentery, concerning for peritoneal carcinomatosis.  PET CT scan was recommended.  It was noted that her ovaries were grossly unremarkable, however ovarian neoplasm was not discounted.  PET CT scan was performed on 01/05/2024 which showed hypermetabolic lymph nodes in the mediastinum, bilateral internal mammary chains, and right pericardiac region.  There is also the diffuse peritoneal carcinomatosis noted with small volume ascites.  Due to concern for these findings the patient was referred to our clinic for further evaluation and management.  Today we will order tumor markers to include CA 19-9, CA125, and CEA.  We will order a biopsy of the  omentum as well.  The patient voiced understanding of our findings and plan moving forward.  Of note she lives close to the [facility] area and would like to have her care transferred there if possible.    Rogerio Clay, MD Department of Hematology/Oncology [facility] Phone: [phone] Pager: [phone] Email: [email or web address]

## 2024-01-12 ENCOUNTER — Encounter: Payer: Self-pay | Admitting: Medical Oncology

## 2024-01-12 ENCOUNTER — Other Ambulatory Visit: Payer: Self-pay | Admitting: Physician Assistant

## 2024-01-12 DIAGNOSIS — C801 Malignant (primary) neoplasm, unspecified: Secondary | ICD-10-CM

## 2024-01-12 LAB — CEA (ACCESS): CEA (CHCC): <1 ng/mL (ref 0.00–5.00)

## 2024-01-12 NOTE — Progress Notes (Signed)
 [facility]  Patient called this morning with several questions regarding biopsy and needing a root canal. Informed patient that I will review her questions with Elise Guile and call her back before the end of the day. Patient asking 1) if possible to have biopsy in [place] and if she will be having port placed at the same time 2) Patient stated she needs a root canal and asking procedure ok to have as well as taking antibiotics post procedure?   I reviewed patient's questions with Delores Fester and called patient back informing her that [facility] does not do biopsies there, and yes she will have a port placed at the same time while having biopsy at [facility]. Also informed patient that per Delores Fester, ok to have root canal and antibiotics.   Biopsy appointment confirmed with patient, to be in  at 11:30 for 1 PM appointment on June 10th.  Patient inquiring if she needs to have transportation to [place], is [facility] able to provide this? Informed patient I will look into that and let her know. Patient expressed thanks.  Patient encouraged to call with further questions/concerns.   Esperanza Hedges, RN, BSN, [facility] Oncology Nurse Navigator, [facility] 01/12/2024 3:26 PM

## 2024-01-13 LAB — CANCER ANTIGEN 19-9: CA 19-9: 7 U/mL (ref 0–35)

## 2024-01-13 LAB — CA 125: Cancer Antigen (CA) 125: 2165 U/mL — ABNORMAL HIGH (ref 0.0–38.1)

## 2024-01-14 ENCOUNTER — Ambulatory Visit: Payer: Self-pay | Admitting: Physician Assistant

## 2024-01-17 ENCOUNTER — Other Ambulatory Visit: Payer: Self-pay | Admitting: Radiology

## 2024-01-17 ENCOUNTER — Other Ambulatory Visit: Payer: Self-pay

## 2024-01-17 DIAGNOSIS — Z01818 Encounter for other preprocedural examination: Secondary | ICD-10-CM

## 2024-01-17 NOTE — Progress Notes (Signed)
 Patient for IR Port Insertion & CT guided peritoneal biopsy on Tues 01/18/24, I called and spoke with the patient on the phone and gave pre-procedure instructions. Pt was made aware to be here at 11:30a, NPO after MN prior to procedure as well as driver post procedure/recovery/discharge. Pt stated understanding.  Called 01/17/24

## 2024-01-18 ENCOUNTER — Telehealth (HOSPITAL_COMMUNITY): Payer: Self-pay | Admitting: Student

## 2024-01-18 ENCOUNTER — Ambulatory Visit
Admission: RE | Admit: 2024-01-18 | Discharge: 2024-01-18 | Disposition: A | Discharge status: Home / Self Care | Source: Ambulatory Visit | Attending: Physician Assistant | Admitting: Physician Assistant

## 2024-01-18 ENCOUNTER — Encounter: Payer: Self-pay | Admitting: Radiology

## 2024-01-18 ENCOUNTER — Ambulatory Visit
Admission: RE | Admit: 2024-01-18 | Discharge: 2024-01-18 | Disposition: A | Discharge status: Home / Self Care | Source: Ambulatory Visit | Attending: Physician Assistant | Admitting: Radiology

## 2024-01-18 DIAGNOSIS — C786 Secondary malignant neoplasm of retroperitoneum and peritoneum: Secondary | ICD-10-CM | POA: Insufficient documentation

## 2024-01-18 DIAGNOSIS — Z452 Encounter for adjustment and management of vascular access device: Secondary | ICD-10-CM | POA: Insufficient documentation

## 2024-01-18 DIAGNOSIS — Z87891 Personal history of nicotine dependence: Secondary | ICD-10-CM | POA: Diagnosis not present

## 2024-01-18 DIAGNOSIS — G4733 Obstructive sleep apnea (adult) (pediatric): Secondary | ICD-10-CM | POA: Diagnosis not present

## 2024-01-18 DIAGNOSIS — E669 Obesity, unspecified: Secondary | ICD-10-CM | POA: Diagnosis not present

## 2024-01-18 DIAGNOSIS — C799 Secondary malignant neoplasm of unspecified site: Secondary | ICD-10-CM

## 2024-01-18 DIAGNOSIS — R971 Elevated cancer antigen 125 [CA 125]: Secondary | ICD-10-CM | POA: Diagnosis not present

## 2024-01-18 DIAGNOSIS — C800 Disseminated malignant neoplasm, unspecified: Secondary | ICD-10-CM | POA: Diagnosis not present

## 2024-01-18 DIAGNOSIS — R591 Generalized enlarged lymph nodes: Secondary | ICD-10-CM | POA: Insufficient documentation

## 2024-01-18 DIAGNOSIS — Z79899 Other long term (current) drug therapy: Secondary | ICD-10-CM | POA: Diagnosis not present

## 2024-01-18 DIAGNOSIS — Z6841 Body Mass Index (BMI) 40.0 and over, adult: Secondary | ICD-10-CM | POA: Diagnosis not present

## 2024-01-18 DIAGNOSIS — C801 Malignant (primary) neoplasm, unspecified: Secondary | ICD-10-CM

## 2024-01-18 DIAGNOSIS — Z01818 Encounter for other preprocedural examination: Secondary | ICD-10-CM

## 2024-01-18 HISTORY — PX: IR IMAGING GUIDED PORT INSERTION: IMG5740

## 2024-01-18 LAB — CBC
HCT: 37.7 % (ref 36.0–46.0)
Hemoglobin: 12.3 g/dL (ref 12.0–15.0)
MCH: 29.1 pg (ref 26.0–34.0)
MCHC: 32.6 g/dL (ref 30.0–36.0)
MCV: 89.1 fL (ref 80.0–100.0)
Platelets: 300 10*3/uL (ref 150–400)
RBC: 4.23 MIL/uL (ref 3.87–5.11)
RDW: 12.7 % (ref 11.5–15.5)
WBC: 8.2 10*3/uL (ref 4.0–10.5)
nRBC: 0 % (ref 0.0–0.2)

## 2024-01-18 LAB — PROTIME-INR
INR: 1.1 (ref 0.8–1.2)
Prothrombin Time: 14.4 s (ref 11.4–15.2)

## 2024-01-18 MED ORDER — MIDAZOLAM HCL 2 MG/2ML IJ SOLN
INTRAMUSCULAR | Status: AC
  Filled 2024-01-18: qty 2

## 2024-01-18 MED ORDER — FENTANYL CITRATE (PF) 100 MCG/2ML IJ SOLN
INTRAMUSCULAR | Status: AC
Start: 2024-01-18 — End: ?
  Filled 2024-01-18: qty 4

## 2024-01-18 MED ORDER — FENTANYL CITRATE (PF) 100 MCG/2ML IJ SOLN
INTRAMUSCULAR | Status: AC
Start: 2024-01-18 — End: ?
  Filled 2024-01-18: qty 2

## 2024-01-18 MED ORDER — CEFAZOLIN SODIUM-DEXTROSE 2-4 GM/100ML-% IV SOLN
INTRAVENOUS | Status: AC
  Filled 2024-01-18: qty 100

## 2024-01-18 MED ORDER — LIDOCAINE-EPINEPHRINE 1 %-1:100000 IJ SOLN
INTRAMUSCULAR | Status: AC
  Filled 2024-01-18: qty 1

## 2024-01-18 MED ORDER — HEPARIN SOD (PORK) LOCK FLUSH 100 UNIT/ML IV SOLN
INTRAVENOUS | Status: AC
  Filled 2024-01-18: qty 5

## 2024-01-18 MED ORDER — SODIUM CHLORIDE 0.9% FLUSH: 3.0000 mL | INTRAVENOUS | Status: DC | PRN

## 2024-01-18 MED ORDER — FENTANYL CITRATE (PF) 100 MCG/2ML IJ SOLN
INTRAMUSCULAR | Status: AC | PRN
  Administered 2024-01-18 (×2): 25 ug via INTRAVENOUS
  Administered 2024-01-18: 50 ug via INTRAVENOUS
  Administered 2024-01-18 (×2): 25 ug via INTRAVENOUS

## 2024-01-18 MED ORDER — FENTANYL CITRATE (PF) 100 MCG/2ML IJ SOLN
INTRAMUSCULAR | Status: AC | PRN
  Administered 2024-01-18: 25 ug via INTRAVENOUS
  Administered 2024-01-18: 50 ug via INTRAVENOUS
  Administered 2024-01-18 (×2): 25 ug via INTRAVENOUS

## 2024-01-18 MED ORDER — FENTANYL CITRATE (PF) 100 MCG/2ML IJ SOLN
INTRAMUSCULAR | Status: AC
  Filled 2024-01-18: qty 2

## 2024-01-18 MED ORDER — LIDOCAINE HCL 1 % IJ SOLN
INTRAMUSCULAR | Status: AC
  Filled 2024-01-18: qty 20

## 2024-01-18 MED ORDER — LIDOCAINE HCL 1 % IJ SOLN
20.0000 mL | Freq: Once | INTRAMUSCULAR | Status: AC
Start: 2024-01-18 — End: 2024-01-18
  Administered 2024-01-18: 14 mL via INTRADERMAL

## 2024-01-18 MED ORDER — HEPARIN SOD (PORK) LOCK FLUSH 100 UNIT/ML IV SOLN
500.0000 [IU] | Freq: Once | INTRAVENOUS | Status: AC
Start: 2024-01-18 — End: 2024-01-18
  Administered 2024-01-18: 500 [IU] via INTRAVENOUS

## 2024-01-18 MED ORDER — SODIUM CHLORIDE 0.9 % IV SOLN: INTRAVENOUS | Status: DC

## 2024-01-18 MED ORDER — SODIUM CHLORIDE 0.9% FLUSH: 3.0000 mL | Freq: Two times a day (BID) | INTRAVENOUS | Status: DC

## 2024-01-18 MED ORDER — MIDAZOLAM HCL 2 MG/2ML IJ SOLN
INTRAMUSCULAR | Status: AC | PRN
  Administered 2024-01-18 (×4): .5 mg via INTRAVENOUS
  Administered 2024-01-18: 1 mg via INTRAVENOUS

## 2024-01-18 MED ORDER — MIDAZOLAM HCL 2 MG/2ML IJ SOLN
INTRAMUSCULAR | Status: AC | PRN
  Administered 2024-01-18 (×3): .5 mg via INTRAVENOUS
  Administered 2024-01-18: 1 mg via INTRAVENOUS

## 2024-01-18 MED ORDER — CEFAZOLIN SODIUM-DEXTROSE 1-4 GM/50ML-% IV SOLN
INTRAVENOUS | Status: AC
  Filled 2024-01-18: qty 50

## 2024-01-18 MED ORDER — CEFAZOLIN SODIUM-DEXTROSE 2-4 GM/100ML-% IV SOLN
INTRAVENOUS | Status: AC | PRN
  Administered 2024-01-18: 3 g via INTRAVENOUS

## 2024-01-18 MED ORDER — MIDAZOLAM HCL 2 MG/2ML IJ SOLN
INTRAMUSCULAR | Status: AC
Start: 2024-01-18 — End: ?
  Filled 2024-01-18: qty 4

## 2024-01-18 NOTE — Telephone Encounter (Signed)
 Encounter created in error

## 2024-01-18 NOTE — Procedures (Signed)
 Interventional Radiology Procedure Note  Procedure: Single Lumen Power Port Placement    Access:  Right IJ vein.  Findings: Catheter tip positioned at SVC/RA junction. Port is ready for immediate use.   Complications: None  EBL: < 10 mL  Recommendations:  - Ok to shower in 24 hours - Do not submerge for 7 days - Routine line care   Maxi Carreras T. Fredia Sorrow, M.D Pager:  [phone]

## 2024-01-18 NOTE — Procedures (Signed)
Interventional Radiology Procedure Note  Procedure: CT Guided Biopsy of peritoneal mass  Complications: None  Estimated Blood Loss: < 10 mL  Findings: 18 G core biopsy of peritoneal mass performed under CT guidance.  Four core samples obtained and sent to Pathology.  Jodi Marble. Fredia Sorrow, M.D Pager:  [phone]

## 2024-01-18 NOTE — H&P (Signed)
 Chief Complaint: Patient was seen in consultation today for peritoneal carcinomatosis.    Referring Physician(s): Darilyn Edin  Supervising Physician: Erica Hau  Patient Status: [facility] - Out-pt  History of Present Illness: Susan Frederick is a 73 y.o. female with a medical history significant for HTN, obesity, OSA on CPAP and recently identified peritoneal carcinomatosis with lymphadenopathy. She presented to her PCP in early may with ongoing abdominal pain, bloating and dysuria. A CT abdomen/pelvis at an outside hospital showed extensive, abnormal diffuse stranding and nodularity involving the omentum and mesentery.   The patient was referred to Oncology and IR has been asked to evaluate this patient for an image-guided biopsy of one of the omental masses. Imaging reviewed and procedure approved by Dr. Darylene Epley.   Past Medical History:  Diagnosis Date   Arthritis    DDD (degenerative disc disease), lumbar    Diverticulosis of colon    Hematuria    Hypertension    Mixed stress and urge urinary incontinence    Obesity    OSA on CPAP     Past Surgical History:  Procedure Laterality Date   COLONOSCOPY     diviated     HAND TENDON SURGERY     right   KNEE ARTHROSCOPY     left   NOSE SURGERY     deviated septum   POLYPECTOMY     TOE SURGERY     right foot/ little toe   WISDOM TOOTH EXTRACTION  1969    Allergies: Patient has no known allergies.  Medications: Prior to Admission medications   Medication Sig Start Date End Date Taking? Authorizing Provider  aspirin 81 MG tablet Take 81 mg by mouth daily.      [provider]  Boswellia-Glucosamine-Vit D (OSTEO BI-FLEX ONE PER DAY PO) Take by mouth.    [provider]  lisinopril (PRINIVIL,ZESTRIL) 20 MG tablet Take 20 mg by mouth daily.      [provider]  Multiple Vitamins-Minerals (MULTIVITAMIN WITH MINERALS) tablet Take 1 tablet by mouth daily.  Centrum Womens Silver    [provider]  Multiple Vitamins-Minerals (PRESERVISION AREDS 2) CAPS Take 1 capsule by mouth every evening.    [provider]  oxyCODONE  (OXY IR/ROXICODONE ) 5 MG immediate release tablet Take 1-2 tablets (5-10 mg total) by mouth every 6 (six) hours as needed for severe pain (pain score 7-10). 01/11/24   Thayil, Irene T, PA-C  senna (SENOKOT) 8.6 MG TABS tablet Take 2 tablets (17.2 mg total) by mouth daily. 01/11/24   Thayil, Irene T, PA-C  sertraline (ZOLOFT) 50 MG tablet Take 50 mg by mouth daily.      [provider]  TURMERIC CURCUMIN PO Take 1 tablet by mouth daily as needed (for inflammation).     [provider]     Family History  Problem Relation Age of Onset   Endometrial cancer Sister    Breast cancer Maternal Grandmother    Throat cancer Maternal Grandfather    Lung cancer Maternal Aunt     Social History   Socioeconomic History   Marital status: Single    Spouse name: Not on file   Number of children: Not on file   Years of education: Not on file   Highest education level: Not on file  Occupational History   Not on file  Tobacco Use   Smoking status: Former    Types: Cigarettes   Smokeless tobacco: Never   Tobacco comments:  Smoked for 18 years, quit in 1986.   Substance and Sexual Activity   Alcohol use: Not Currently    Comment: socially   Drug use: No   Sexual activity: Not on file  Other Topics Concern   Not on file  Social History Narrative   Not on file   Social Drivers of Health   Financial Resource Strain: Low Risk  (12/18/2023)   Received from [facility]   Overall Financial Resource Strain (CARDIA)    Difficulty of Paying Living Expenses: Not hard at all  Food Insecurity: No Food Insecurity (12/18/2023)   Received from [facility]   Hunger Vital Sign    Worried About Running Out of Food in the Last Year: Never true    Ran Out of Food in the Last Year: Never true  Transportation Needs: No Transportation Needs  (12/18/2023)   Received from [facility] - Transportation    Lack of Transportation (Medical): No    Lack of Transportation (Non-Medical): No  Physical Activity: Inactive (12/18/2023)   Received from [facility]   Exercise Vital Sign    Days of Exercise per Week: 0 days    Minutes of Exercise per Session: 60 min  Stress: No Stress Concern Present (12/18/2023)   Received from [facility] - Occupational Stress Questionnaire    Feeling of Stress : Not at all  Social Connections: Moderately Integrated (12/18/2023)   Received from [facility]   Social Network    How would you rate your social network (family, work, friends)?: Adequate participation with social networks    Review of Systems: A 12 point ROS discussed and pertinent positives are indicated in the HPI above.  All other systems are negative.  Review of Systems  Gastrointestinal:  Positive for abdominal pain and constipation.  All other systems reviewed and are negative.   Vital Signs: BP (!) 146/81   Pulse 76   Temp 98.5 F (36.9 C) (Oral)   Resp 15   Ht 5\' 6"  (1.676 m)   Wt (!) 312 lb (141.5 kg)   SpO2 95%   BMI 50.36 kg/m   Physical Exam Constitutional:      General: She is not in acute distress.    Appearance: She is obese. She is not ill-appearing.  HENT:     Mouth/Throat:     Mouth: Mucous membranes are moist.     Pharynx: Oropharynx is clear.  Cardiovascular:     Rate and Rhythm: Normal rate.  Pulmonary:     Effort: Pulmonary effort is normal.  Abdominal:     Tenderness: There is abdominal tenderness.  Neurological:     Mental Status: She is alert and oriented to person, place, and time.  Psychiatric:        Mood and Affect: Mood normal.        Behavior: Behavior normal.        Thought Content: Thought content normal.        Judgment: Judgment normal.     Imaging: No results found.  Labs:  CBC: Recent Labs    01/11/24 1030  01/18/24 1211  WBC 8.9 8.2  HGB 12.5 12.3  HCT 37.2 37.7  PLT 310 300    COAGS: No results for input(s): "INR", "APTT" in the last 8760 hours.  BMP: Recent Labs    01/11/24 1030  NA 138  K 4.5  CL 102  CO2 26  GLUCOSE 135*  BUN 11  CALCIUM 9.6  CREATININE 0.70  GFRNONAA >60    LIVER FUNCTION TESTS: Recent Labs    01/11/24 1030  BILITOT 0.4  AST 12*  ALT 8  ALKPHOS 82  PROT 7.4  ALBUMIN 3.7    TUMOR MARKERS: Recent Labs    01/11/24 1030  CEA <1.00    Assessment and Plan:  Peritoneal carcinomatosis with lymphadenopathy: Solina Heron, 73 year old female, presents today for an image-guided biopsy of an omental mass.   Risks and benefits of this procedure were discussed with the patient and/or patient's family including, but not limited to bleeding, infection, damage to adjacent structures or low yield requiring additional tests.  All of the questions were answered and there is agreement to proceed. She has been NPO. She does not take any blood-thinning medications.   Consent signed and in chart.   Thank you for this interesting consult.  I greatly enjoyed meeting ARION MORGAN and look forward to participating in their care.  A copy of this report was sent to the requesting provider on this date.  Electronically Signed: Jetta Morrow, AGACNP-BC 01/18/2024, 12:40 PM   I spent a total of  30 Minutes   in face to face in clinical consultation, greater than 50% of which was counseling/coordinating care for peritoneal carcinomatosis.

## 2024-01-18 NOTE — Discharge Instructions (Addendum)
 Implanted [facility] Guide  An implanted port is a type of central line that is placed under the skin. Central lines are used to provide IV access when treatment or nutrition needs to be given through a person's veins. Implanted ports are used for long-term IV access. An implanted port may be placed because: You need IV medicine that would be irritating to the small veins in your hands or arms. You need long-term IV medicines, such as antibiotics. You need IV nutrition for a long period. You need frequent blood draws for lab tests. You need dialysis.   Implanted ports are usually placed in the chest area, but they can also be placed in the upper arm, the abdomen, or the leg. An implanted port has two main parts: Reservoir. The reservoir is round and will appear as a small, raised area under your skin. The reservoir is the part where a needle is inserted to give medicines or draw blood. Catheter. The catheter is a thin, flexible tube that extends from the reservoir. The catheter is placed into a large vein. Medicine that is inserted into the reservoir goes into the catheter and then into the vein.   How will I care for my incision  You may shower tomorrow Please remove dressing in 24hrs not other skin care is needed  How is my port accessed? Special steps must be taken to access the port: Before the port is accessed, a numbing cream can be placed on the skin. This helps numb the skin over the port site. Your health care provider uses a sterile technique to access the port. Your health care provider must put on a mask and sterile gloves. The skin over your port is cleaned carefully with an antiseptic and allowed to dry. The port is gently pinched between sterile gloves, and a needle is inserted into the port. Only "non-coring" port needles should be used to access the port. Once the port is accessed, a blood return should be checked. This helps ensure that the port is in the vein and is not  clogged. If your port needs to remain accessed for a constant infusion, a clear (transparent) bandage will be placed over the needle site. The bandage and needle will need to be changed every week, or as directed by your health care provider.   What is flushing? Flushing helps keep the port from getting clogged. Follow your health care provider's instructions on how and when to flush the port. Ports are usually flushed with saline solution or a medicine called heparin. The need for flushing will depend on how the port is used. If the port is used for intermittent medicines or blood draws, the port will need to be flushed: After medicines have been given. After blood has been drawn. As part of routine maintenance. If a constant infusion is running, the port may not need to be flushed.   How long will my port stay implanted? The port can stay in for as long as your health care provider thinks it is needed. When it is time for the port to come out, surgery will be done to remove it. The procedure is similar to the one performed when the port was put in. When should I seek immediate medical care? When you have an implanted port, you should seek immediate medical care if: You notice a bad smell coming from the incision site. You have swelling, redness, or drainage at the incision site. You have more swelling or pain at the  port site or the surrounding area. You have a fever that is not controlled with medicine.   This information is not intended to replace advice given to you by your health care provider. Make sure you discuss any questions you have with your health care provider. Document Released: 07/27/2005 Document Revised: 01/02/2016 Document Reviewed: 04/03/2013 Elsevier Interactive Patient Education  2017 Else[employer]  Needle Biopsy, Care After These instructions tell you how to care for yourself after your procedure. Your doctor may also give you more specific instructions. Call your  doctor if you have any problems or questions. What can I expect after the procedure? After the procedure, it is common to have: Soreness. Bruising. Mild pain. Follow these instructions at home:  Return to your normal activities tomorrow. Take over-the-counter and prescription medicines only as told by your doctor. Wash your hands with soap and water before you change your bandage (dressing). If you cannot use soap and water, use hand sanitizer. You may remove your bandage in 2 days Check your puncture site every day for signs of infection. Watch for: Redness, swelling, or pain. Fluid or blood.  Pus or a bad smell. Warmth. You can shower tomorrow Keep all follow-up visits as told by your doctor. This is important. Contact a doctor if you have: A fever. Redness, swelling, or pain at the puncture site, and it lasts longer than a few days. Fluid, blood, or pus coming from the puncture site. Warmth coming from the puncture site. Get help right away if: You have a lot of bleeding from the puncture site. Summary After the procedure, it is common to have soreness, bruising, or mild pain at the puncture site. Check your puncture site every day for signs of infection, such as redness, swelling, or pain. Get help right away if you have severe bleeding from your puncture site. This information is not intended to replace advice given to you by your health care provider. Make sure you discuss any questions you have with your health care provider. Document Revised: 08/09/2017 Document Reviewed: 08/09/2017 Elsevier Patient Education  2020 Elsevier Inc.Needle Biopsy, Care After These instructions tell you how to care for yourself after your procedure. Your doctor may also give you more specific instructions. Call your doctor if you have any problems or questions. What can I expect after the procedure? After the procedure, it is common to have: Soreness. Bruising. Mild pain. Follow these  instructions at home:  Return to your normal activities tomorrow. Take over-the-counter and prescription medicines only as told by your doctor. Wash your hands with soap and water before you change your bandage (dressing). If you cannot use soap and water, use hand sanitizer. You may remove your bandage in 2 days Check your puncture site every day for signs of infection. Watch for: Redness, swelling, or pain. Fluid or blood.  Pus or a bad smell. Warmth. You can shower tomorrow Keep all follow-up visits as told by your doctor. This is important. Contact a doctor if you have: A fever. Redness, swelling, or pain at the puncture site, and it lasts longer than a few days. Fluid, blood, or pus coming from the puncture site. Warmth coming from the puncture site. Get help right away if: You have a lot of bleeding from the puncture site. Summary After the procedure, it is common to have soreness, bruising, or mild pain at the puncture site. Check your puncture site every day for signs of infection, such as redness, swelling, or pain. Get help right  away if you have severe bleeding from your puncture site. This information is not intended to replace advice given to you by your health care provider. Make sure you discuss any questions you have with your health care provider. Document Revised: 08/09/2017 Document Reviewed: 08/09/2017 Elsevier Patient Education  2020 [employer].

## 2024-01-21 ENCOUNTER — Encounter: Payer: Self-pay | Admitting: Medical Oncology

## 2024-01-21 ENCOUNTER — Telehealth: Payer: Self-pay

## 2024-01-21 ENCOUNTER — Encounter: Payer: Self-pay | Admitting: *Deleted

## 2024-01-21 DIAGNOSIS — C801 Malignant (primary) neoplasm, unspecified: Secondary | ICD-10-CM

## 2024-01-21 NOTE — Progress Notes (Signed)
 PATIENT NAVIGATOR PROGRESS NOTE  Name: Susan Frederick Date: 01/21/2024 MRN: [id number]  DOB: September 12, 1950   Reason for visit:  New Patient appt  Comments:  Spoke with [name] and have scheduled her with Dr Scherrie Curt on 01/27/24 at 1:40pm for New Patient appt  Reviewed directions to building and parking as well as one support person allowed in the appt.   She is meeting with Dr Daisey Dryer on Monday for surgical consult  Given contact number to call with any questions     Time spent counseling/coordinating care: 30-45 minutes

## 2024-01-21 NOTE — Telephone Encounter (Signed)
 Spoke with the patient regarding the referral to GYN oncology. Patient scheduled as new patient with Dr Daisey Dryer on 01/24/2024. Patient given an arrival time of 10:00am.  Explained to the patient the the doctor will perform a pelvic exam at this visit. Patient given the policy that only one visitor allowed and that visitor must be over 16 yrs are allowed in the Cancer Center. Patient given the address/phone number for the clinic and that the center offers free valet service. Patient aware that masks required.

## 2024-01-21 NOTE — Progress Notes (Signed)
  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    Cancer Staging  No matching staging information was found for the patient.   SDOH Screening and Interventions Updated:  No  SDOH Screenings   Food Insecurity: No Food Insecurity (12/18/2023)   Received from [facility]  Housing: Low Risk  (12/18/2023)   Received from [facility]  Transportation Needs: No Transportation Needs (12/18/2023)   Received from [facility]  Utilities: Not At Risk (12/18/2023)   Received from [facility]  Financial Resource Strain: Low Risk  (12/18/2023)   Received from [facility]  Physical Activity: Inactive (12/18/2023)   Received from [facility]  Social Connections: Moderately Integrated (12/18/2023)   Received from [facility]  Stress: No Stress Concern Present (12/18/2023)   Received from [facility]  Tobacco Use: Medium Risk (01/18/2024)     Genetics Assessment Completed:  No Genetics Referral Made:  no  Care Team Updated:  Yes   Esperanza Hedges, RN, BSN, [facility] Oncology Nurse Navigator, [facility] 01/21/2024 10:52 AM

## 2024-01-24 ENCOUNTER — Encounter: Payer: Self-pay | Admitting: Oncology

## 2024-01-24 ENCOUNTER — Encounter: Payer: Self-pay | Admitting: Psychiatry

## 2024-01-24 ENCOUNTER — Inpatient Hospital Stay: Admitting: Psychiatry

## 2024-01-24 VITALS — BP 112/54 | HR 79 | Temp 98.3°F | Resp 18 | Ht 66.0 in | Wt 310.0 lb

## 2024-01-24 DIAGNOSIS — C579 Malignant neoplasm of female genital organ, unspecified: Secondary | ICD-10-CM

## 2024-01-24 DIAGNOSIS — C772 Secondary and unspecified malignant neoplasm of intra-abdominal lymph nodes: Secondary | ICD-10-CM

## 2024-01-24 DIAGNOSIS — C786 Secondary malignant neoplasm of retroperitoneum and peritoneum: Secondary | ICD-10-CM

## 2024-01-24 DIAGNOSIS — C569 Malignant neoplasm of unspecified ovary: Secondary | ICD-10-CM

## 2024-01-24 DIAGNOSIS — R971 Elevated cancer antigen 125 [CA 125]: Secondary | ICD-10-CM

## 2024-01-24 DIAGNOSIS — R591 Generalized enlarged lymph nodes: Secondary | ICD-10-CM | POA: Diagnosis not present

## 2024-01-24 DIAGNOSIS — Z5111 Encounter for antineoplastic chemotherapy: Secondary | ICD-10-CM | POA: Diagnosis not present

## 2024-01-24 DIAGNOSIS — C771 Secondary and unspecified malignant neoplasm of intrathoracic lymph nodes: Secondary | ICD-10-CM

## 2024-01-24 NOTE — Progress Notes (Signed)
 GYNECOLOGIC ONCOLOGY NEW PATIENT CONSULTATION  Date of Service: 01/24/2024 Referring Provider: Wyline Hearing, PA-C   ASSESSMENT AND PLAN: Susan Frederick is a 73 y.o. 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  PET with concern for peritoneal carcinomatosis, omental cake as well as retroperitoneal hypermetabolic lymph nodes and hypermetabolic lymph nodes in the mediastinum.  Omental biopsy confirms high-grade serous carcinoma of likely GYN origin.  WT1 positive, so this is most likely consistent with ovarian/fallopian tube/primary peritoneal origin.  Reviewed typical treatment for advanced ovarian/FT/PP cancer.  Treatment is with the combination of chemotherapy and surgery.  Given extent of disease, recommend neoadjuvant chemotherapy with carboplatin/paclitaxel.  Could consider addition of bevacizumab, but okay holding off on this and reevaluating after 3 cycles of treatment.  Recommend 3 cycles of neoadjuvant treatment and interval imaging to assess for consideration of interval debulking surgery.  Would tentatively plan for surgery after 4 cycles to aid in coordination of care.  Given CA125 is a marker for her would follow this during treatment to assess treatment response.  Reviewed treatment in brief and potential side effects.  Patient has follow-up with Dr. Scherrie Curt on 01/27/24. Has port in place.  Additionally, recommend NGS testing and HRD on tumor.  Recommend germline genetic testing.  Referral sent.    A copy of this note was sent to the patient's referring provider.  Derrel Flies, MD Gynecologic Oncology   Medical Decision Making I personally spent  TOTAL 60 minutes face-to-face and non-face-to-face in the care of this patient, which includes all pre, intra, and post visit time on the date of service.   ------------  CC: High-grade serous carcinoma of GYN origin  HISTORY OF PRESENT ILLNESS:  Susan Frederick is a 73 y.o. woman who is seen in consultation at  the request of Wyline Hearing, PA-C for evaluation of new diagnosis of high-grade serous carcinoma of GYN origin.  Patient was reported to present her PCP in May for abdominal pain, bloating, and dysuria.  She was sent for CT scan on 12/27/2023 which showed stranding in the omentum and mesentery concerning for peritoneal carcinomatosis, small amount of ascites, periumbilical hernia, enlarged aortocaval retroperitoneal lymph node.  A subsequent PET was performed on 01/05/2024 which showed hypermetabolic lymph nodes in the mediastinum, bilateral internal mammary chains, right pericardial region as well as diffuse peritoneal carcinomatosis and small hypermetabolic retroperitoneal abdominal nodes. Tumor markers were drawn on 01/11/2024 and showed an elevated CA125 to 2165, normal CEA of <1.0, and normal CA 19-9 of 7.  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  Today presents with her friend.  Notes that she has had bloating, early satiety, abdominal discomfort for the past 2 months.  Has also lost 15 pounds in that timeframe.  She has noted irritation with urination at the end of her urinary stream.  Denies bleeding or spotting.  Has had constipation that has improved with daily senna and MiraLAX.  Had port placed on 01/18/2024.  Reports a history of cervical polyp that was removed in 04/14/2018.  Final pathology was benign with no dysplasia.    PAST MEDICAL HISTORY: Past Medical History:  Diagnosis Date   Arthritis    DDD (degenerative disc disease), lumbar    Diverticulosis of colon    Hematuria    Hypertension    Mixed stress and urge urinary incontinence    Obesity  OSA on CPAP     PAST SURGICAL HISTORY: Past Surgical History:  Procedure Laterality Date   ANKLE FUSION WITH GASTROC SLIDE Left 2010   COLONOSCOPY     HAND TENDON SURGERY     right   IR IMAGING GUIDED PORT  INSERTION  01/18/2024   KNEE ARTHROSCOPY     left   NOSE SURGERY     deviated septum   POLYPECTOMY     ROOT CANAL  2025   TOE SURGERY     right foot/ little toe   WISDOM TOOTH EXTRACTION  08/11/1967    OB/GYN HISTORY: OB History  Gravida Para Term Preterm AB Living  0 0 0 0 0 0  SAB IAB Ectopic Multiple Live Births  0 0 0 0 0      Age at menarche: 79 Age at menopause: 33 Hx of HRT: no Hx of STI: no Last pap: stopped at age 19 History of abnormal pap smears: no  SCREENING STUDIES:  Last mammogram: 04/2023 Last colonoscopy: ?2012, cologuard 2023  MEDICATIONS:  Current Outpatient Medications:    Boswellia-Glucosamine-Vit D (OSTEO BI-FLEX ONE PER DAY PO), Take by mouth., Disp: , Rfl:    lisinopril (PRINIVIL,ZESTRIL) 20 MG tablet, Take 20 mg by mouth daily.  , Disp: , Rfl:    Multiple Vitamins-Minerals (MULTIVITAMIN WITH MINERALS) tablet, Take 1 tablet by mouth daily.  Centrum Womens Silver, Disp: , Rfl:    Multiple Vitamins-Minerals (PRESERVISION AREDS 2) CAPS, Take 1 capsule by mouth every evening., Disp: , Rfl:    oxyCODONE  (OXY IR/ROXICODONE ) 5 MG immediate release tablet, Take 1-2 tablets (5-10 mg total) by mouth every 6 (six) hours as needed for severe pain (pain score 7-10)., Disp: 30 tablet, Rfl: 0   senna (SENOKOT) 8.6 MG TABS tablet, Take 2 tablets (17.2 mg total) by mouth daily., Disp: 120 tablet, Rfl: 0   sertraline (ZOLOFT) 50 MG tablet, Take 50 mg by mouth daily.  , Disp: , Rfl:   ALLERGIES: No Known Allergies  FAMILY HISTORY: Family History  Problem Relation Age of Onset   Endometrial cancer Sister    Skin cancer Sister    Breast cancer Maternal Grandmother    Throat cancer Maternal Grandfather    Lung cancer Maternal Aunt    Ovarian cancer Neg Hx    Colon cancer Neg Hx     SOCIAL HISTORY: Social History   Socioeconomic History   Marital status: Single    Spouse name: Not on file   Number of children: Not on file   Years of education: Not on  file   Highest education level: Not on file  Occupational History   Not on file  Tobacco Use   Smoking status: Former    Current packs/day: 0.00    Average packs/day: 1 pack/day for 17.0 years (17.0 ttl pk-yrs)    Types: Cigarettes    Start date: 22    Quit date: 73    Years since quitting: 39.4   Smokeless tobacco: Never   Tobacco comments:    Smoked for 18 years, quit in 1986.   Vaping Use   Vaping status: Never Used  Substance and Sexual Activity   Alcohol use: Yes    Comment: socially   Drug use: No   Sexual activity: Never  Other Topics Concern   Not on file  Social History Narrative   Not on file   Social Drivers of Health   Financial Resource Strain: Low Risk  (12/18/2023)   Received  from [employer]   Overall Financial Resource Strain (CARDIA)    Difficulty of Paying Living Expenses: Not hard at all  Food Insecurity: No Food Insecurity (12/18/2023)   Received from [facility]   Hunger Vital Sign    Within the past 12 months, you worried that your food would run out before you got the money to buy more.: Never true    Within the past 12 months, the food you bought just didn't last and you didn't have money to get more.: Never true  Transportation Needs: No Transportation Needs (12/18/2023)   Received from [facility] - Transportation    Lack of Transportation (Medical): No    Lack of Transportation (Non-Medical): No  Physical Activity: Inactive (12/18/2023)   Received from [facility]   Exercise Vital Sign    On average, how many days per week do you engage in moderate to strenuous exercise (like a brisk walk)?: 0 days    On average, how many minutes do you engage in exercise at this level?: 60 min  Stress: No Stress Concern Present (12/18/2023)   Received from [facility] - Occupational Stress Questionnaire    Feeling of Stress : Not at all  Social Connections: Moderately Integrated (12/18/2023)    Received from [facility]   Social Network    How would you rate your social network (family, work, friends)?: Adequate participation with social networks  Intimate Partner Violence: Not At Risk (12/18/2023)   Received from [facility]   HITS    Over the last 12 months how often did your partner physically hurt you?: Never    Over the last 12 months how often did your partner insult you or talk down to you?: Never    Over the last 12 months how often did your partner threaten you with physical harm?: Never    Over the last 12 months how often did your partner scream or curse at you?: Never    REVIEW OF SYSTEMS: New patient intake form was reviewed.  Complete 10-system review is negative except for the following: Shortness of breath, constipation, wheezing, abdominal distention, back pain, anxiety, abdominal pain, lightheadedness, nonproductive cough, pain with urination  PHYSICAL EXAM: BP (!) 112/54 (BP Location: Left Wrist, Patient Position: Sitting)   Pulse 79   Temp 98.3 F (36.8 C) (Oral)   Resp 18   Ht 5' 6 (1.676 m)   Wt (!) 310 lb (140.6 kg)   SpO2 98%   BMI 50.04 kg/m  Constitutional: No acute distress. Neuro/Psych: Alert, oriented.  Head and Neck: Normocephalic, atraumatic. Neck symmetric without masses. Sclera anicteric.  Respiratory: Normal work of breathing. Clear to auscultation bilaterally. Cardiovascular: Regular rate and rhythm, no murmurs, rubs, or gallops. Abdomen: Normoactive bowel sounds. Soft, non-distended, mild TTP. Omental cake not discretely palpated due to body habitus. Extremities: Grossly normal range of motion. Warm, well perfused. No edema bilaterally. Skin: No rashes or lesions. Lymphatic: No cervical, supraclavicular, or inguinal adenopathy. Genitourinary: External genitalia without lesions. Urethral meatus without lesions or prolapse. On speculum exam, vagina and cervix without lesions. Bimanual exam reveals normal cervix. No obviously  adnexal masses, exam limited by body habitus. Rectovaginal exam confirms the above findings and reveals normal sphincter tone and no masses or nodularity. Exam chaperoned by Vira Grieves, NP   LABORATORY AND RADIOLOGIC DATA: Outside medical records were reviewed to synthesize the above history, along with the history and physical obtained during the  visit.  Outside laboratory, pathology, and imaging reports were reviewed, with pertinent results below.  I personally reviewed the outside images.  WBC  Date Value Ref Range Status  01/18/2024 8.2 4.0 - 10.5 K/uL Final   Hemoglobin  Date Value Ref Range Status  01/18/2024 12.3 12.0 - 15.0 g/dL Final  08/12/7251 66.4 12.0 - 15.0 g/dL Final   HCT  Date Value Ref Range Status  01/18/2024 37.7 36.0 - 46.0 % Final   Platelets  Date Value Ref Range Status  01/18/2024 300 150 - 400 K/uL Final   Platelet Count  Date Value Ref Range Status  01/11/2024 310 150 - 400 K/uL Final   Creatinine  Date Value Ref Range Status  01/11/2024 0.70 0.44 - 1.00 mg/dL Final   AST  Date Value Ref Range Status  01/11/2024 12 (L) 15 - 41 U/L Final   ALT  Date Value Ref Range Status  01/11/2024 8 0 - 44 U/L Final   Cancer Antigen (CA) 125  Date Value Ref Range Status  01/11/2024 2,165.0 (H) 0.0 - 38.1 U/mL Final    Comment:    (NOTE) Roche Diagnostics Electrochemiluminescence Immunoassay (ECLIA) Values obtained with different assay methods or kits cannot be used interchangeably.  Results cannot be interpreted as absolute evidence of the presence or absence of malignant disease. Performed At: [facility] [address] [place], [place] [id number] Pearlean Botts MD DG:[id number]    CEA [facility])  Date Value Ref Range Status  01/11/2024 <1.00 0.00 - 5.00 ng/mL Final    Comment:    (NOTE) This test was performed using Beckman Coulter's paramagnetic chemiluminescent immunoassay. Values obtained from different assay methods cannot be used  interchangeably. Please note that up to 8% of patients who smoke may see values 5.1-10.0 ng/ml and 1% of patients who smoke may see CEA levels >10.0 ng/ml. Performed at [facility], [address], [place], [place] [address]     Surgical pathology (01/18/24):  1. Peritoneum, biopsy, left mass/carcinomatosis :      - METASTATIC HIGH-GRADE SEROUS CARCINOMA OF GYNE ORIGIN.       Diagnosis Note : The carcinoma is positive for PAX8 and WT-1. p53 demonstrates      aberrant expression (null phenotype). These findings support the above      diagnosis. Stain controls worked appropriately.      Wyline Hearing, PA-C was notified on 01/21/2024.This case underwent      intradepartmental consultation and Dr. Kavin Parsley concurs with the      interpretation.      There is sufficient material for ancillary molecular testing (1A, 1D, 1C, 1B).   Outside CT abdomen/pelvis (12/27/23): FINDINGS:   VISUALIZED LOWER THORAX:  Minor patchy linear atelectasis/scarring in the anterior right middle lobe and lingula.  Small hiatal hernia.   SOLID VISCERA:  Liver: There is a small hypodense lesion in the anterior liver, too small to characterize, series 2 image 44, possibly a tiny hepatic cyst.  Gallbladder: Grossly unremarkable.  Pancreas: Grossly unremarkable.  Adrenal glands: Normal.  Spleen: Normal.  Kidneys: No acute findings. Punctate nonobstructing calculus lower pole left kidney. Tiny probable cyst in the medial right kidney, too small to characterize.   GI:  No bowel obstruction.  Moderate amount of stool, primarily in the cecum and right colon, limiting evaluation. The distal colon and rectum are relatively nondistended. Mild sigmoid diverticulosis.  The appendix is not visualized.  The stomach is relatively nondistended.    PERITONEAL CAVITY/RETROPERITONEUM:  There appears to  be extensive abnormal diffuse stranding and nodularity involving the omentum and mesentery, concerning  for peritoneal carcinomatosis/metastatic disease.  Small amount of scattered ascites in the abdomen and pelvis.  Small periumbilical hernia with associated soft tissue density nodularity.   Enlarged aortocaval retroperitoneal node, approximately 3.1 x 1.7 cm, series 2 image 57.   No pneumoperitoneum.  Aorta, IVC, iliac arteries, and major visceral arteries are grossly normal.  Mild calcified atherosclerotic disease.   PELVIS:  Relatively nondistended urinary bladder.  Grossly unremarkable uterus. The ovaries are difficult to visualize, but appear grossly unremarkable.   MUSCULOSKELETAL:  Osteopenia. Age-indeterminate vertebral body compression deformity at L1, probably chronic. Probable hemangioma at T12. Multilevel degenerative changes in the visualized spine. Mild lumbar levoscoliosis. Mild arthritis of both hips.   MISC:  Small probable intramuscular lipoma, partially imaged, in the anterior right proximal thigh, series 2 image 197.    IMPRESSION:  1. Extensive abnormal diffuse stranding and nodularity involving the omentum and mesentery, concerning for peritoneal carcinomatosis/metastatic disease. Correlate clinically. Whole body PET/CT scan may be helpful.   2.  Small amount of ascites in the abdomen and pelvis, possibly malignant ascites.   3.  Small periumbilical hernia with associated soft tissue density nodularity.   4.  Enlarged aortocaval retroperitoneal node.   5.  The ovaries are difficult to visualize, but appear grossly unremarkable. Underlying ovarian neoplasm is not excluded given the above described peritoneal disease.  6.  Moderate amount of stool, primarily in the cecum and right colon, limiting evaluation. Underlying GI neoplasm is not excluded.   7.  Small hiatal hernia.   8.  Age-indeterminate L1 vertebral body compression deformity, probably chronic.   9.  Other findings as described.    Outside PET CT (01/05/24): FINDINGS: Reference SUV Max of normal  liver parenchyma is 4.7.   Within the chest there are hypermetabolic mediastinal, bilateral internal mammary, and right pericardial lymph nodes. Right pericardial lymph node on image 144 measures 12 x 10 mm with an SUV Max of 6.1..   Diffuse peritoneal carcinomatosis throughout the abdomen. Omental caking has SUV Max of 9.5.   Hypermetabolic retroperitoneal abdominal nodes extending from the celiac axis down to the level of the third portion of the duodenum.   Lungs are clear. Gallbladder present. No biliary ductal dilatation. No renal stone or hydronephrosis no concerning bone lesions.    IMPRESSION:  1. Hypermetabolic lymph nodes in the mediastinum, bilateral internal mammary chains, and right pericardial region.  2. Diffuse peritoneal carcinomatosis. Small associated ascites. No evidence of obstruction.  3. Small hypermetabolic retroperitoneal abdominal nodes.  4. The primary lesion is not definitely identified. Consider omental biopsy for further evaluation

## 2024-01-24 NOTE — Progress Notes (Signed)
 Order requisition for [facility] CDx sent on accession [id number] per Dr. Daisey Dryer.

## 2024-01-24 NOTE — Patient Instructions (Signed)
 It was a pleasure to see you in clinic today. - We discussed starting with chemotherapy for 3 treatments and imaging after 3 treatments. - If you are having a good response, we may consider surgery after the 4th treatment - Return visit planned for after your imaging after 3 treatments  Thank you very much for allowing me to provide care for you today.  I appreciate your confidence in choosing our Gynecologic Oncology team at [facility].  If you have any questions about your visit today please call our office or send us  a MyChart message and we will get back to you as soon as possible.

## 2024-01-27 ENCOUNTER — Ambulatory Visit: Admitting: Oncology

## 2024-01-27 ENCOUNTER — Encounter: Payer: Self-pay | Admitting: *Deleted

## 2024-01-27 ENCOUNTER — Inpatient Hospital Stay: Admitting: Oncology

## 2024-01-27 VITALS — BP 135/71 | HR 96 | Temp 97.8°F | Resp 18 | Ht 66.0 in | Wt 307.8 lb

## 2024-01-27 DIAGNOSIS — Z5111 Encounter for antineoplastic chemotherapy: Secondary | ICD-10-CM | POA: Diagnosis not present

## 2024-01-27 DIAGNOSIS — C579 Malignant neoplasm of female genital organ, unspecified: Secondary | ICD-10-CM

## 2024-01-27 DIAGNOSIS — C5700 Malignant neoplasm of unspecified fallopian tube: Secondary | ICD-10-CM | POA: Insufficient documentation

## 2024-01-27 MED ORDER — ONDANSETRON HCL 8 MG PO TABS: 8.0000 mg | ORAL_TABLET | Freq: Three times a day (TID) | ORAL | 0 refills | Status: DC | PRN

## 2024-01-27 MED ORDER — DEXAMETHASONE 2 MG PO TABS: 10.0000 mg | ORAL_TABLET | ORAL | 0 refills | Status: DC

## 2024-01-27 MED ORDER — PROCHLORPERAZINE MALEATE 10 MG PO TABS: 10.0000 mg | ORAL_TABLET | Freq: Four times a day (QID) | ORAL | 0 refills | Status: DC | PRN

## 2024-01-27 MED ORDER — LIDOCAINE-PRILOCAINE 2.5-2.5 % EX CREA: 1.0000 | TOPICAL_CREAM | CUTANEOUS | 2 refills | Status: AC | PRN

## 2024-01-27 NOTE — Progress Notes (Signed)
 PATIENT NAVIGATOR PROGRESS NOTE  Name: Susan Frederick Date: 01/27/2024 MRN: [id number]  DOB: January 20, 1951   Reason for visit:  New Patient appt  Comments:  Met with [name] and her sister during visit with Dr Scherrie Curt  Referral to SW placed Referral to [facility] one testing pending Given handouts of Paclitaxel and Carboplatin which will begin next week Given contact numbers to call with any questions or issues Will have pt education session tomorrow    Time spent counseling/coordinating care: > 60 minutes

## 2024-01-27 NOTE — Progress Notes (Signed)
 [facility] New Patient Consult   Requesting MD: Gerrianne Krauss, Pa-c [address] [place],  [place] [address]   Susan Frederick 73 y.o.  Jun 06, 1951    Reason for Consult: Gynecologic cancer   HPI: Susan Frederick reports developing symptoms of a mild urinary tract infection in April.  She was treated for urinary tract infection without improvement.  She reports a decrease sensation to urinate with discomfort at the end of urination. She developed constipation and abdominal bloating and was referred for a CT abdomen/pelvis 12/27/2023.  The CT revealed abnormal diffuse stranding and nodularity of the omentum and mesentery.  A small amount of ascites was noted in the abdomen and pelvis.  There is a small periumbilical hernia with soft tissue nodularity.  An enlarged aortocaval retroperitoneal node.  The ovaries were difficult to visualize. She was referred for a staging PET scan 12/28/2023.  Hypermetabolic mediastinal, bilateral internal mammary, and right pericardial nodes were noted.  Diffuse peritoneal carcinomatosis with omental caking.  Hypermetabolic retroperitoneal abdominal nodes extended from the celiac axis to the third portion of the duodenum.  She was seen in the rapid diagnostic clinic 01/11/2024.  She was referred for a CT-guided biopsy of left anterior peritoneal tumor on 01/18/2024.  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 She underwent Port-A-Cath placement on 01/18/2024.  Susan Frederick is here with her sister.  She has persistent abdominal bloating, pain, and constipation.  The constipation is relieved with Senokot.  She has small-volume soft bowel movements.  She saw Dr. Daisey Frederick on 01/24/2024.  The clinical presentation is consistent with ovarian/fallopian tube or a primary peritoneal origin.  Neoadjuvant chemotherapy is recommended.  Past Medical History:  Diagnosis Date   Arthritis    DDD (degenerative disc disease), lumbar     Diverticulosis of colon    Hematuria    Hypertension    Mixed stress and urge urinary incontinence    Obesity    OSA on CPAP     .  G0, P0   .  Prediabetes   Past Surgical History:  Procedure Laterality Date   ANKLE FUSION WITH GASTROC SLIDE Left 2010   COLONOSCOPY     HAND TENDON SURGERY     right   IR IMAGING GUIDED PORT INSERTION  01/18/2024   KNEE ARTHROSCOPY     left   NOSE SURGERY     deviated septum   POLYPECTOMY     ROOT CANAL  2025   TOE SURGERY     right foot/ little toe   WISDOM TOOTH EXTRACTION  08/11/1967    Medications: Reviewed  Allergies: No Known Allergies  Family history: Maternal grandmother had breast cancer.  Maternal grandfather had throat cancer.  Maternal aunt had lung cancer.  A sister had endometrial cancer  Social History:   She lives alone in [place].  She is a retired Social research officer, government.  She quit smoking cigarettes in 1986 after smoking for 16 years.  She reports social alcohol use.  No transfusion history.  No risk factor for hepatitis.  ROS:   Positives include: Increased perspiration, exertional dyspnea, anorexia with a 20 pound weight loss since April 2025, constipation with small soft bowel movements and diarrhea, decreased sensation to urinate, abdominal pain, abdominal distention  A complete ROS was otherwise negative.  Physical Exam:  Blood pressure 135/71, pulse 96, temperature 97.8 F (36.6 C), temperature source Temporal, resp. rate 18, height 5' 6 (1.676 m), weight (!) 307 lb 12.8 oz (  139.6 kg), SpO2 100%.  HEENT: Oropharynx without visible mass, neck without mass Lungs: Clear bilaterally Cardiac: Regular rate and rhythm Abdomen: Diffuse tenderness, no mass, no apparent ascites, no hepatosplenomegaly  Vascular: No leg edema Lymph nodes: No cervical, supraclavicular, axillary, or inguinal nodes Neurologic: Alert and oriented, the motor exam appears intact in the upper lower extremities bilaterally Skin: No  rash Musculoskeletal: No spine tenderness   LAB:  CBC  Lab Results  Component Value Date   WBC 8.2 01/18/2024   HGB 12.3 01/18/2024   HCT 37.7 01/18/2024   MCV 89.1 01/18/2024   PLT 300 01/18/2024   NEUTROABS 7.0 01/11/2024        CMP  Lab Results  Component Value Date   NA 138 01/11/2024   K 4.5 01/11/2024   CL 102 01/11/2024   CO2 26 01/11/2024   GLUCOSE 135 (H) 01/11/2024   BUN 11 01/11/2024   CREATININE 0.70 01/11/2024   CALCIUM 9.6 01/11/2024   PROT 7.4 01/11/2024   ALBUMIN 3.7 01/11/2024   AST 12 (L) 01/11/2024   ALT 8 01/11/2024   ALKPHOS 82 01/11/2024   BILITOT 0.4 01/11/2024   GFRNONAA >60 01/11/2024   GFRAA >60 09/10/2016       Imaging: 01/05/2024 PET images reviewed with Susan Frederick and her sister      Assessment/Plan:   Metastatic gynecologic cancer, stage IV (TX, NX,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) 12/27/2023: CT abdomen/pelvis-extensive abnormal diffuse stranding and nodularity of the omentum and mesentery, small volume ascites, small periumbilical hernia with soft tissue nodularity, enlarged aortocaval retroperitoneal node 01/05/2024: PET-hypermetabolic lymph nodes in the mediastinum, bilateral internal mammary, and right pericardial areas, diffuse carcinomatosis, small hypermetabolic retroperitoneal nodes, no primary lesion identified 01/18/2024: CT-guided biopsy of left peritoneal tumor-metastatic high-grade serous carcinoma, PAX8 and WT1 positive Elevated CA125  Abdominal pain secondary to 1 Anorexia/weight loss secondary to #1 Hypertension Arthritis Benign endocervix polyp 2019 Family history of breast, endometrial, lung, and head neck cancer   Disposition:   Susan Frederick has been diagnosed with an advanced stage malignancy.  The clinical presentation, elevated CA125, and pathology are consistent with an advanced stage gynecologic cancer.  There is clinical evidence of stage IV disease based on PET evidence of metastatic chest lymphadenopathy.  I discussed the  prognosis and treatment options with Susan Frederick.  She saw Dr. Daisey Frederick.  Dr. Daisey Frederick recommends neoadjuvant chemotherapy prior to considering cytoreductive surgery.  I recommend paclitaxel/carboplatin.  We reviewed potential toxicities associated with the paclitaxel/carboplatin regimen including the chance of nausea, alopecia, hematologic toxicity, infection, and bleeding.  We discussed the allergic reaction, bone pain, ototoxicity, and neuropathy associated with paclitaxel.  We discussed the allergic reaction, nausea, and hematologic toxicity associated with carboplatin.  I recommend G-CSF support following chemotherapy.  We discussed the bone pain, rash, and splenic rupture associated with G-CSF.  She agrees to proceed.  She will attend a chemotherapy teaching class.  Susan Frederick has a family history of breast and uterine cancer.  She will be referred to the genetics counselor.  The peritoneal biopsy tissue will be submitted for NGS testing.  She will return for a chemotherapy teaching class and baseline lab studies on 01/28/2024.  She will be scheduled for cycle 1 paclitaxel/carboplatin 02/02/2024.  She was scheduled for a nadir CBC approximately 12 days following chemotherapy.  Susan Frederick will return for an office visit prior to cycle 2 chemotherapy.  A treatment plan was entered today.  Coni Deep, MD  01/27/2024, 4:59 PM

## 2024-01-27 NOTE — Progress Notes (Signed)
 START ON PATHWAY REGIMEN - Ovarian     A cycle is every 21 days:     Paclitaxel      Carboplatin   **Always confirm dose/schedule in your pharmacy ordering system**  Patient Characteristics: Preoperative or Nonsurgical Candidate (Clinical Staging), Newly Diagnosed, Neoadjuvant Therapy followed by Surgery BRCA Mutation Status: Awaiting Test Results Therapeutic Status: Preoperative or Nonsurgical Candidate (Clinical Staging) AJCC T Category: cTX AJCC 8 Stage Grouping: IV AJCC N Category: cNX AJCC M Category: cM1 Therapy Plan: Neoadjuvant Therapy followed by Surgery Intent of Therapy: Curative Intent, Discussed with Patient

## 2024-01-28 ENCOUNTER — Inpatient Hospital Stay

## 2024-01-28 ENCOUNTER — Other Ambulatory Visit: Payer: Self-pay

## 2024-01-28 ENCOUNTER — Encounter: Payer: Self-pay | Admitting: Oncology

## 2024-01-28 DIAGNOSIS — C579 Malignant neoplasm of female genital organ, unspecified: Secondary | ICD-10-CM

## 2024-01-28 DIAGNOSIS — Z5111 Encounter for antineoplastic chemotherapy: Secondary | ICD-10-CM | POA: Diagnosis not present

## 2024-01-28 LAB — CMP (CANCER CENTER ONLY)
ALT: 7 U/L (ref 0–44)
AST: 16 U/L (ref 15–41)
Albumin: 3.7 g/dL (ref 3.5–5.0)
Alkaline Phosphatase: 96 U/L (ref 38–126)
Anion gap: 15 (ref 5–15)
BUN: 13 mg/dL (ref 8–23)
CO2: 22 mmol/L (ref 22–32)
Calcium: 9.8 mg/dL (ref 8.9–10.3)
Chloride: 100 mmol/L (ref 98–111)
Creatinine: 0.76 mg/dL (ref 0.44–1.00)
GFR, Estimated: >60 mL/min (ref >60)
Glucose, Bld: 130 mg/dL — ABNORMAL HIGH (ref 70–99)
Potassium: 3.9 mmol/L (ref 3.5–5.1)
Sodium: 138 mmol/L (ref 135–145)
Total Bilirubin: 0.4 mg/dL (ref 0.0–1.2)
Total Protein: 7.2 g/dL (ref 6.5–8.1)

## 2024-01-28 LAB — CBC WITH DIFFERENTIAL (CANCER CENTER ONLY)
Abs Immature Granulocytes: 0.04 10*3/uL (ref 0.00–0.07)
Basophils Absolute: 0.1 10*3/uL (ref 0.0–0.1)
Basophils Relative: 1 %
Eosinophils Absolute: 0.1 10*3/uL (ref 0.0–0.5)
Eosinophils Relative: 1 %
HCT: 35.5 % — ABNORMAL LOW (ref 36.0–46.0)
Hemoglobin: 11.6 g/dL — ABNORMAL LOW (ref 12.0–15.0)
Immature Granulocytes: 1 %
Lymphocytes Relative: 12 %
Lymphs Abs: 0.9 10*3/uL (ref 0.7–4.0)
MCH: 28.9 pg (ref 26.0–34.0)
MCHC: 32.7 g/dL (ref 30.0–36.0)
MCV: 88.5 fL (ref 80.0–100.0)
Monocytes Absolute: 0.5 10*3/uL (ref 0.1–1.0)
Monocytes Relative: 7 %
Neutro Abs: 5.9 10*3/uL (ref 1.7–7.7)
Neutrophils Relative %: 78 %
Platelet Count: 287 10*3/uL (ref 150–400)
RBC: 4.01 MIL/uL (ref 3.87–5.11)
RDW: 13.2 % (ref 11.5–15.5)
WBC Count: 7.5 10*3/uL (ref 4.0–10.5)
nRBC: 0 % (ref 0.0–0.2)

## 2024-01-28 MED ORDER — SODIUM CHLORIDE 0.9% FLUSH
10.0000 mL | Freq: Once | INTRAVENOUS | Status: AC
  Administered 2024-01-28: 10 mL via INTRAVENOUS

## 2024-01-28 MED ORDER — HEPARIN SOD (PORK) LOCK FLUSH 100 UNIT/ML IV SOLN
500.0000 [IU] | Freq: Once | INTRAVENOUS | Status: AC
  Administered 2024-01-28: 500 [IU] via INTRAVENOUS

## 2024-01-28 NOTE — Progress Notes (Signed)
 PATIENT NAVIGATOR PROGRESS NOTE  Name: Susan Frederick Date: 01/28/2024 MRN: [id number]  DOB: Oct 27, 1950   Reason for visit:  Patient education session  Comments:  Pt and sister attended patient ed session  please see Education note and Distress screening    Time spent counseling/coordinating care: > 60 minutes

## 2024-01-29 LAB — CA 125: Cancer Antigen (CA) 125: 2045 U/mL — ABNORMAL HIGH (ref 0.0–38.1)

## 2024-01-30 ENCOUNTER — Other Ambulatory Visit: Payer: Self-pay | Admitting: Oncology

## 2024-01-30 DIAGNOSIS — C579 Malignant neoplasm of female genital organ, unspecified: Secondary | ICD-10-CM

## 2024-01-31 ENCOUNTER — Inpatient Hospital Stay

## 2024-01-31 NOTE — Progress Notes (Signed)
 Pharmacist Chemotherapy Monitoring - Initial Assessment    Anticipated start date: 02/02/24   The following has been reviewed per standard work regarding the patient's treatment regimen: The patient's diagnosis, treatment plan and drug doses, and organ/hematologic function Lab orders and baseline tests specific to treatment regimen  The treatment plan start date, drug sequencing, and pre-medications Prior authorization status  Patient's documented medication list, including drug-drug interaction screen and prescriptions for anti-emetics and supportive care specific to the treatment regimen The drug concentrations, fluid compatibility, administration routes, and timing of the medications to be used The patient's access for treatment and lifetime cumulative dose history, if applicable  The patient's medication allergies and previous infusion related reactions, if applicable   Changes made to treatment plan:  N/A  Follow up needed:  N/A   Latiesha Harada Kreul, RPH, 01/31/2024  10:04 AM

## 2024-01-31 NOTE — Progress Notes (Signed)
 [facility] Clinical Social Work  Initial Assessment   Susan Frederick is a 73 y.o. year old female contacted by phone. Clinical Social Work was referred by new patient protocol for assessment of psychosocial needs.   SDOH (Social Determinants of Health) assessments performed: No SDOH Interventions    Flowsheet Row Office Visit from 01/27/2024 in [facility] - A Dept Of [place]  SDOH Interventions   Food Insecurity Interventions Intervention Not Indicated  Housing Interventions Intervention Not Indicated  Transportation Interventions Intervention Not Indicated  Utilities Interventions Intervention Not Indicated    SDOH Screenings   Food Insecurity: No Food Insecurity (01/27/2024)  Housing: Unknown (01/27/2024)  Transportation Needs: No Transportation Needs (01/27/2024)  Utilities: Not At Risk (01/27/2024)  Depression (PHQ2-9): Medium Risk (01/27/2024)  Financial Resource Strain: Low Risk  (12/18/2023)   Received from [facility]  Physical Activity: Inactive (12/18/2023)   Received from [facility]  Social Connections: Moderately Integrated (12/18/2023)   Received from [facility]  Stress: No Stress Concern Present (12/18/2023)   Received from [facility]  Tobacco Use: Medium Risk (01/24/2024)     Distress Screen completed: No    01/28/2024   12:00 PM  ONCBCN DISTRESS SCREENING  Screening Type Initial Screening  How much distress have you been experiencing in the past week? (0-10) 4  Emotional concerns type Worry or anxiety  Physical Concerns Type  Pain;Sleep;Fatigue;Changes in eating;Loss or change of physical abilities      Family/Social Information:  Housing Arrangement: patient lives alone in [place]. Family members/support persons in your life? Patient named her sister who lives in [place] and several close friends in [place] as members of her support systems.  Transportation concerns: no, Patient has friends who will assist with  transportation if needed. Otherwise, patient will transport herself.  Employment: Retired Patient is a retired Social research officer, government.  Income source: Actor concerns: No Type of concern: None Food access concerns: no Religious or spiritual practice: No Advanced directives: Patient has an Location manager.  Services Currently in place:  Insurance, Transportation, Family Housing  Coping/ Adjustment to diagnosis: Patient understands treatment plan and what happens next? yes Concerns about diagnosis and/or treatment: Patient's current challenges is abdominal pain that is present most days and leads to fatigue.  Patient reported stressors: Adjusting to my illness. Patient described initial shock of diagnosis and now describes feeling eager to get going with treatment. Current coping skills/ strengths: Ability for insight , Active sense of humor , Average or above average intelligence , Capable of independent living , Communication skills , General fund of knowledge , Motivation for treatment/growth , and Supportive family/friends     SUMMARY: Current SDOH Barriers:  No SDOH barriers identified.  Clinical Social Work Clinical Goal(s):  No clinical social work goals at this time  Interventions: Discussed common feeling and emotions when being diagnosed with cancer, and the importance of support during treatment Informed patient of the support team roles and support services at [facility] Provided CSW contact information and encouraged patient to call with any questions or concerns Provided education and assistance to patient regarding Advance Directives Provided patient with information about services Csw can provided.    Follow Up Plan: Patient will contact CSW with any support or resource needs Patient verbalizes understanding of plan: Yes  Lizbeth Sprague, LCSW Clinical Social Worker [facility]

## 2024-02-02 ENCOUNTER — Ambulatory Visit

## 2024-02-02 ENCOUNTER — Inpatient Hospital Stay

## 2024-02-02 VITALS — BP 109/74 | HR 95 | Temp 98.2°F | Resp 18 | Wt 307.0 lb

## 2024-02-02 DIAGNOSIS — Z5111 Encounter for antineoplastic chemotherapy: Secondary | ICD-10-CM | POA: Diagnosis not present

## 2024-02-02 DIAGNOSIS — C579 Malignant neoplasm of female genital organ, unspecified: Secondary | ICD-10-CM

## 2024-02-02 MED ORDER — SODIUM CHLORIDE 0.9 % IV SOLN
150.0000 mg | Freq: Once | INTRAVENOUS | Status: AC
  Administered 2024-02-02: 150 mg via INTRAVENOUS
  Filled 2024-02-02: qty 150

## 2024-02-02 MED ORDER — DEXAMETHASONE SODIUM PHOSPHATE 10 MG/ML IJ SOLN
10.0000 mg | Freq: Once | INTRAMUSCULAR | Status: AC
  Administered 2024-02-02: 10 mg via INTRAVENOUS
  Filled 2024-02-02: qty 1

## 2024-02-02 MED ORDER — DIPHENHYDRAMINE HCL 50 MG/ML IJ SOLN
50.0000 mg | Freq: Once | INTRAMUSCULAR | Status: AC
  Administered 2024-02-02: 50 mg via INTRAVENOUS
  Filled 2024-02-02: qty 1

## 2024-02-02 MED ORDER — PALONOSETRON HCL INJECTION 0.25 MG/5ML
0.2500 mg | Freq: Once | INTRAVENOUS | Status: AC
  Administered 2024-02-02: 0.25 mg via INTRAVENOUS
  Filled 2024-02-02: qty 5

## 2024-02-02 MED ORDER — FAMOTIDINE IN NACL 20-0.9 MG/50ML-% IV SOLN
20.0000 mg | Freq: Once | INTRAVENOUS | Status: AC
  Administered 2024-02-02: 20 mg via INTRAVENOUS
  Filled 2024-02-02: qty 50

## 2024-02-02 MED ORDER — SODIUM CHLORIDE 0.9 % IV SOLN
175.0000 mg/m2 | Freq: Once | INTRAVENOUS | Status: AC
  Administered 2024-02-02: 444 mg via INTRAVENOUS
  Filled 2024-02-02: qty 74

## 2024-02-02 MED ORDER — SODIUM CHLORIDE 0.9 % IV SOLN
677.0000 mg | Freq: Once | INTRAVENOUS | Status: AC
  Administered 2024-02-02: 680 mg via INTRAVENOUS
  Filled 2024-02-02: qty 68

## 2024-02-02 MED ORDER — SODIUM CHLORIDE 0.9 % IV SOLN: INTRAVENOUS | Status: DC

## 2024-02-02 NOTE — Patient Instructions (Signed)
 [facility] - A DEPT OF [facility]  Discharge Instructions: Thank you for choosing [facility] to provide your oncology and hematology care.   If you have a lab appointment with the Cancer Center, please go directly to the Cancer Center and check in at the registration area.   Wear comfortable clothing and clothing appropriate for easy access to any Portacath or PICC line.   We strive to give you quality time with your provider. You may need to reschedule your appointment if you arrive late (15 or more minutes).  Arriving late affects you and other patients whose appointments are after yours.  Also, if you miss three or more appointments without notifying the office, you may be dismissed from the clinic at the provider's discretion.      For prescription refill requests, have your pharmacy contact our office and allow 72 hours for refills to be completed.    Today you received the following chemotherapy and/or immunotherapy agents :  Taxol ,  Carboplatin .   To help prevent nausea and vomiting after your treatment, we encourage you to take your nausea medication as directed.  BELOW ARE SYMPTOMS THAT SHOULD BE REPORTED IMMEDIATELY: *FEVER GREATER THAN 100.4 F (38 C) OR HIGHER *CHILLS OR SWEATING *NAUSEA AND VOMITING THAT IS NOT CONTROLLED WITH YOUR NAUSEA MEDICATION *UNUSUAL SHORTNESS OF BREATH *UNUSUAL BRUISING OR BLEEDING *URINARY PROBLEMS (pain or burning when urinating, or frequent urination) *BOWEL PROBLEMS (unusual diarrhea, constipation, pain near the anus) TENDERNESS IN MOUTH AND THROAT WITH OR WITHOUT PRESENCE OF ULCERS (sore throat, sores in mouth, or a toothache) UNUSUAL RASH, SWELLING OR PAIN  UNUSUAL VAGINAL DISCHARGE OR ITCHING   Items with * indicate a potential emergency and should be followed up as soon as possible or go to the Emergency Department if any problems should occur.  Please show the CHEMOTHERAPY ALERT CARD or  IMMUNOTHERAPY ALERT CARD at check-in to the Emergency Department and triage nurse.  Should you have questions after your visit or need to cancel or reschedule your appointment, please contact [facility] - A DEPT OF [facility]  Dept: [phone]  and follow the prompts.  Office hours are 8:00 a.m. to 4:30 p.m. Monday - Friday. Please note that voicemails left after 4:00 p.m. may not be returned until the following business day.  We are closed weekends and major holidays. You have access to a nurse at all times for urgent questions. Please call the main number to the clinic Dept: [phone] and follow the prompts.   For any non-urgent questions, you may also contact your provider using MyChart. We now offer e-Visits for anyone 46 and older to request care online for non-urgent symptoms. For details visit [email or web address].   Also download the MyChart app! Go to the app store, search MyChart, open the app, select [place], and log in with your MyChart username and password.

## 2024-02-02 NOTE — Progress Notes (Signed)
 Distress screen completed. Has already spoken w/CSW and no further needs identified. Discussed using her pain med and trying 1/2 tablet when she has pain instead of letting it run its course. Discussed how to take MiraLax  and Senna. She will try Tylenol PM for sleep.

## 2024-02-03 ENCOUNTER — Telehealth: Payer: Self-pay

## 2024-02-03 NOTE — Telephone Encounter (Signed)
 24 Hour Call Back   Reached out to patient regarding first treatment. Patient stated she is feeling good overall, she has experienced some light headedness. RN educated patient to make sure she has adequate intake of fluids and foods. Also educated to call us  with any questions or concerns.

## 2024-02-04 ENCOUNTER — Encounter (HOSPITAL_COMMUNITY): Payer: Self-pay | Admitting: Psychiatry

## 2024-02-04 ENCOUNTER — Inpatient Hospital Stay

## 2024-02-04 VITALS — BP 132/51 | HR 71 | Temp 98.2°F | Resp 18

## 2024-02-04 DIAGNOSIS — C579 Malignant neoplasm of female genital organ, unspecified: Secondary | ICD-10-CM

## 2024-02-04 DIAGNOSIS — Z5111 Encounter for antineoplastic chemotherapy: Secondary | ICD-10-CM | POA: Diagnosis not present

## 2024-02-04 MED ORDER — PEGFILGRASTIM-CBQV 6 MG/0.6ML ~~LOC~~ SOSY
6.0000 mg | PREFILLED_SYRINGE | Freq: Once | SUBCUTANEOUS | Status: AC
  Administered 2024-02-04: 6 mg via SUBCUTANEOUS
  Filled 2024-02-04: qty 0.6

## 2024-02-04 NOTE — Patient Instructions (Signed)

## 2024-02-05 ENCOUNTER — Other Ambulatory Visit (HOSPITAL_BASED_OUTPATIENT_CLINIC_OR_DEPARTMENT_OTHER): Payer: Self-pay

## 2024-02-05 ENCOUNTER — Emergency Department (HOSPITAL_BASED_OUTPATIENT_CLINIC_OR_DEPARTMENT_OTHER)
Admission: EM | Admit: 2024-02-05 | Discharge: 2024-02-05 | Disposition: A | Discharge status: Home / Self Care | Attending: Emergency Medicine | Admitting: Emergency Medicine

## 2024-02-05 ENCOUNTER — Other Ambulatory Visit: Payer: Self-pay

## 2024-02-05 ENCOUNTER — Encounter (HOSPITAL_BASED_OUTPATIENT_CLINIC_OR_DEPARTMENT_OTHER): Payer: Self-pay | Admitting: Emergency Medicine

## 2024-02-05 ENCOUNTER — Encounter: Payer: Self-pay | Admitting: Oncology

## 2024-02-05 DIAGNOSIS — T887XXA Unspecified adverse effect of drug or medicament, initial encounter: Secondary | ICD-10-CM | POA: Insufficient documentation

## 2024-02-05 DIAGNOSIS — R1084 Generalized abdominal pain: Secondary | ICD-10-CM | POA: Diagnosis present

## 2024-02-05 DIAGNOSIS — Z79899 Other long term (current) drug therapy: Secondary | ICD-10-CM | POA: Diagnosis not present

## 2024-02-05 DIAGNOSIS — M899 Disorder of bone, unspecified: Secondary | ICD-10-CM | POA: Diagnosis not present

## 2024-02-05 DIAGNOSIS — T3695XA Adverse effect of unspecified systemic antibiotic, initial encounter: Secondary | ICD-10-CM | POA: Insufficient documentation

## 2024-02-05 DIAGNOSIS — N3000 Acute cystitis without hematuria: Secondary | ICD-10-CM | POA: Diagnosis not present

## 2024-02-05 DIAGNOSIS — D72829 Elevated white blood cell count, unspecified: Secondary | ICD-10-CM | POA: Diagnosis not present

## 2024-02-05 DIAGNOSIS — T50905A Adverse effect of unspecified drugs, medicaments and biological substances, initial encounter: Secondary | ICD-10-CM

## 2024-02-05 HISTORY — DX: Malignant (primary) neoplasm, unspecified: C80.1

## 2024-02-05 LAB — COMPREHENSIVE METABOLIC PANEL WITH GFR
ALT: 16 U/L (ref 0–44)
AST: 21 U/L (ref 15–41)
Albumin: 3.5 g/dL (ref 3.5–5.0)
Alkaline Phosphatase: 91 U/L (ref 38–126)
Anion gap: 11 (ref 5–15)
BUN: 13 mg/dL (ref 8–23)
CO2: 24 mmol/L (ref 22–32)
Calcium: 9.2 mg/dL (ref 8.9–10.3)
Chloride: 103 mmol/L (ref 98–111)
Creatinine, Ser: 0.69 mg/dL (ref 0.44–1.00)
GFR, Estimated: >60 mL/min (ref >60)
Glucose, Bld: 130 mg/dL — ABNORMAL HIGH (ref 70–99)
Potassium: 3.7 mmol/L (ref 3.5–5.1)
Sodium: 138 mmol/L (ref 135–145)
Total Bilirubin: 0.5 mg/dL (ref 0.0–1.2)
Total Protein: 6.6 g/dL (ref 6.5–8.1)

## 2024-02-05 LAB — CBC WITH DIFFERENTIAL/PLATELET
Abs Immature Granulocytes: 0.78 10*3/uL — ABNORMAL HIGH (ref 0.00–0.07)
Basophils Absolute: 0 10*3/uL (ref 0.0–0.1)
Basophils Relative: 0 %
Eosinophils Absolute: 0.1 10*3/uL (ref 0.0–0.5)
Eosinophils Relative: 0 %
HCT: 33.7 % — ABNORMAL LOW (ref 36.0–46.0)
Hemoglobin: 10.8 g/dL — ABNORMAL LOW (ref 12.0–15.0)
Immature Granulocytes: 6 %
Lymphocytes Relative: 5 %
Lymphs Abs: 0.7 10*3/uL (ref 0.7–4.0)
MCH: 28.7 pg (ref 26.0–34.0)
MCHC: 32 g/dL (ref 30.0–36.0)
MCV: 89.6 fL (ref 80.0–100.0)
Monocytes Absolute: 0.1 10*3/uL (ref 0.1–1.0)
Monocytes Relative: 1 %
Neutro Abs: 11.7 10*3/uL — ABNORMAL HIGH (ref 1.7–7.7)
Neutrophils Relative %: 88 %
Platelets: 206 10*3/uL (ref 150–400)
RBC: 3.76 MIL/uL — ABNORMAL LOW (ref 3.87–5.11)
RDW: 13.4 % (ref 11.5–15.5)
WBC: 13.3 10*3/uL — ABNORMAL HIGH (ref 4.0–10.5)
nRBC: 0 % (ref 0.0–0.2)

## 2024-02-05 LAB — URINALYSIS, W/ REFLEX TO CULTURE (INFECTION SUSPECTED)
Bilirubin Urine: NEGATIVE
Glucose, UA: NEGATIVE mg/dL
Hgb urine dipstick: NEGATIVE
Ketones, ur: NEGATIVE mg/dL
Nitrite: POSITIVE — AB
Protein, ur: 30 mg/dL — AB
Specific Gravity, Urine: 1.023 (ref 1.005–1.030)
pH: 6.5 (ref 5.0–8.0)

## 2024-02-05 MED ORDER — CEPHALEXIN 250 MG PO CAPS
500.0000 mg | ORAL_CAPSULE | Freq: Once | ORAL | Status: AC
  Administered 2024-02-05: 500 mg via ORAL
  Filled 2024-02-05: qty 2

## 2024-02-05 MED ORDER — OXYCODONE-ACETAMINOPHEN 5-325 MG PO TABS
1.0000 | ORAL_TABLET | Freq: Once | ORAL | Status: AC
  Administered 2024-02-05: 1 via ORAL
  Filled 2024-02-05: qty 1

## 2024-02-05 MED ORDER — MORPHINE SULFATE 15 MG PO TABS
15.0000 mg | ORAL_TABLET | Freq: Two times a day (BID) | ORAL | 0 refills | Status: AC | PRN
  Filled 2024-02-05: qty 2, 1d supply, fill #0
  Filled 2024-02-05: qty 4, 2d supply, fill #0

## 2024-02-05 MED ORDER — CEPHALEXIN 500 MG PO CAPS
500.0000 mg | ORAL_CAPSULE | Freq: Three times a day (TID) | ORAL | 0 refills | Status: DC
  Filled 2024-02-05: qty 20, 7d supply, fill #0

## 2024-02-05 MED ORDER — HYDROMORPHONE HCL 1 MG/ML IJ SOLN
1.0000 mg | Freq: Once | INTRAMUSCULAR | Status: AC
  Administered 2024-02-05: 1 mg via INTRAVENOUS
  Filled 2024-02-05: qty 1

## 2024-02-05 NOTE — Discharge Instructions (Addendum)
 The workup in the emergency room is overall reassuring.  The oncology doctor recommends that you make sure that you are drinking 64 ounce of water every day.  They also recommend that you take daily Zyrtec, as it might reduce your pain. I have prescribed you morphine sulfate immediate release.  This medicine should be taken only for breakthrough reasons, no more than every 12 hours.  The workup in the ER also showed that you had bladder infection.  Start the antibiotics that are prescribed.  Please return to the ER if your symptoms worsen; you have increased pain, fevers, chills, inability to keep any medications down, confusion. Otherwise see the outpatient doctor as requested.

## 2024-02-05 NOTE — ED Provider Notes (Signed)
 [place] EMERGENCY DEPARTMENT AT [facility] Provider Note   CSN: [id number] Arrival date & time: 02/05/24  9166     Patient presents with: Bone Pain   Susan Frederick is a 74 y.o. female.   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  Patient states that she had chemotherapy last week for her cancer.  Thereafter she received shots to stimulate her bone marrow.  She was told that she can have bony pain.  In the last 2 or 3 days, she has had significant bony pain to the point where she has not been able to sleep.  She is also noted that her abdominal pain is more than usual.  Patient denies any new nausea, vomiting, fevers, chills.  She states that she has had some loose bowel movements, but not normal bowel movements.  Abdominal pain is generalized, patient feels that she is bloated.  She called her cancer team today, they advised that she come to the ER because of her severe pain.  Patient has been taking oxycodone /Percocet for the last 2 or 3 days.  Overnight she took 2 pills, but had no relief.   Prior to Admission medications   Medication Sig Start Date End Date Taking? Authorizing Provider  cephALEXin (KEFLEX) 500 MG capsule Take 1 capsule (500 mg total) by mouth 3 (three) times daily. 02/05/24  Yes Charlyn Sora, MD  morphine (MSIR) 15 MG tablet Take 1 tablet (15 mg total) by mouth every 12 (twelve) hours as needed for up to 3 days for severe pain (pain score 7-10). 02/05/24 02/08/24 Yes Mirian Casco, MD  Boswellia-Glucosamine-Vit D (OSTEO BI-FLEX ONE PER DAY PO) Take by mouth. Patient taking differently: Take 1 tablet by mouth daily as needed.    [provider]  dexamethasone  (DECADRON ) 2 MG tablet Take 5 tablets (10 mg total) by mouth as directed for 2 doses. Take 5 tablets at 10pm night before first treatment and then 5 tablets at 6  am morning of first treatment 01/27/24   Cloretta Arley NOVAK, MD  lidocaine -prilocaine  (EMLA ) cream Apply 1 Application topically as needed (apply to [facility] 1-2 hours prior to appointment). 01/27/24   Cloretta Arley NOVAK, MD  lisinopril (PRINIVIL,ZESTRIL) 20 MG tablet Take 20 mg by mouth daily.      [provider]  Multiple Vitamins-Minerals (MULTIVITAMIN WITH MINERALS) tablet Take 1 tablet by mouth daily.  Centrum Womens Silver    [provider]  Multiple Vitamins-Minerals (PRESERVISION AREDS 2) CAPS Take 1 capsule by mouth every evening.    [provider]  ondansetron  (ZOFRAN ) 8 MG tablet Take 1 tablet (8 mg total) by mouth every 8 (eight) hours as needed (Starting 3 days after chemo as needed for nausea/vomiting). 01/27/24   Cloretta Arley NOVAK, MD  oxyCODONE  (OXY IR/ROXICODONE ) 5 MG immediate release tablet Take 1-2 tablets (5-10 mg total) by mouth every 6 (six) hours as needed for severe pain (pain score 7-10). 01/11/24   Thayil, Irene T, PA-C  prochlorperazine  (COMPAZINE ) 10 MG tablet Take 1 tablet (10 mg total) by mouth every 6 (six) hours as needed for nausea or vomiting. 01/27/24   Cloretta Arley NOVAK, MD  senna (SENOKOT) 8.6 MG TABS tablet Take 2 tablets (17.2 mg total) by mouth daily. 01/11/24   Thayil, Irene T, PA-C  sertraline (ZOLOFT) 50 MG tablet Take 50 mg by mouth daily.  [provider]    Allergies: Patient has no known allergies.    Review of Systems  All other systems reviewed and are negative.   Updated Vital Signs BP (!) 141/53   Pulse 72   Temp 98.4 F (36.9 C) (Oral)   Resp 16   SpO2 100%   Physical Exam Vitals and nursing note reviewed.  Constitutional:      Appearance: She is well-developed.  HENT:     Head: Atraumatic.   Eyes:     Extraocular Movements: Extraocular movements intact.     Pupils: Pupils are equal, round, and reactive to light.    Cardiovascular:     Rate and Rhythm: Normal rate.  Pulmonary:     Effort: Pulmonary  effort is normal.  Abdominal:     General: There is distension.     Tenderness: There is abdominal tenderness. There is no guarding or rebound.   Musculoskeletal:     Cervical back: Normal range of motion and neck supple.   Skin:    General: Skin is warm and dry.   Neurological:     Mental Status: She is alert and oriented to person, place, and time.     (all labs ordered are listed, but only abnormal results are displayed) Labs Reviewed  CBC WITH DIFFERENTIAL/PLATELET - Abnormal; Notable for the following components:      Result Value   WBC 13.3 (*)    RBC 3.76 (*)    Hemoglobin 10.8 (*)    HCT 33.7 (*)    Neutro Abs 11.7 (*)    Abs Immature Granulocytes 0.78 (*)    All other components within normal limits  COMPREHENSIVE METABOLIC PANEL WITH GFR - Abnormal; Notable for the following components:   Glucose, Bld 130 (*)    All other components within normal limits  URINALYSIS, W/ REFLEX TO CULTURE (INFECTION SUSPECTED) - Abnormal; Notable for the following components:   Protein, ur 30 (*)    Nitrite POSITIVE (*)    Leukocytes,Ua TRACE (*)    Bacteria, UA MANY (*)    All other components within normal limits  URINE CULTURE    EKG: None  Radiology: No results found.   Procedures   Medications Ordered in the ED  cephALEXin (KEFLEX) capsule 500 mg (has no administration in time range)  HYDROmorphone (DILAUDID) injection 1 mg (1 mg Intravenous Given 02/05/24 1010)  oxyCODONE -acetaminophen (PERCOCET/ROXICET) 5-325 MG per tablet 1 tablet (1 tablet Oral Given 02/05/24 1142)                                    Medical Decision Making Amount and/or Complexity of Data Reviewed Labs: ordered.  Risk Prescription drug management.   This patient presents to the ED with chief complaint(s) of abdominal pain, bony pain (mostly in the lower extremity). With pertinent past medical history of gynecologic cancer with the metastases to omentum and peritoneum, status post chemo  and shot for bone marrow stimulation.The complaint involves an extensive differential diagnosis and also carries with it a high risk of complications and morbidity.    The differential diagnosis includes : Cancer related pain, medication side effects, DVT, tumor lysis syndrome  The initial plan is to get basic labs, give patient IV Dilaudid.  She is not toxic appearing.   Additional history obtained: Additional history obtained from sister, was at the bedside Records reviewed oncology note.  I also reviewed CT  scan from 6/10 when she had CT-guided biopsy.  Independent labs interpretation:  The following labs were independently interpreted: UA has nitrite positive urine with pyuria, bacteriuria.  In her setting, urine culture has been sent and we will start her on Keflex. CBC shows a leukocytosis, left shift.  Likely this is a result of patient's medications.   Treatment and Reassessment: Patient reassessed.  I reassessed her after Dilaudid.  She stated at that time, that her pain was already better.  I gave her oral Percocet thereafter.  I reassessed the patient again at 1:30 PM.  She indicated to me that she feels a lot better.  Patient is comfortable going home.  ER workup results discussed with her.  Consultation: - Consulted or discussed management/test interpretation with external professional: I consulted oncology and spoke with Dr. Tina. Patient specifically had asked how long to expect her symptoms of leg pain.  Also she wanted to know if she can get something stronger for pain.  Dr. Tina indicated that the pain will last for 5 to 7 days.  He is agreed to morphine sulfate immediate release.  The patient appears reasonably screened and/or stabilized for discharge and I doubt any other medical condition or other [facility] requiring further screening, evaluation, or treatment in the ED at this time prior to discharge.   Results from the ER workup discussed with the patient face to face and  all questions answered to the best of my ability. The patient is safe for discharge with strict return precautions.  Final diagnoses:  Acute cystitis without hematuria  Medication side effect, initial encounter    ED Discharge Orders          Ordered    cephALEXin (KEFLEX) 500 MG capsule  3 times daily        02/05/24 1330    morphine (MSIR) 15 MG tablet  Every 12 hours PRN        02/05/24 1332               Charlyn Sora, MD 02/05/24 1335

## 2024-02-05 NOTE — ED Notes (Signed)
 Dc instructions reviewed with patient. Patient voiced understanding. Dc with belongings.

## 2024-02-05 NOTE — ED Triage Notes (Signed)
 Pt on chemo ,first dose Wednesday, felt good until Thursday night started with bone pain Saw nurse  navigator yesterday and received injection to stimulate wbc, and the se is bone pain.   She was told to take claritin to mitigate these side effects. Took tylenol ,oxycodone , ,then repeated but no relief,still not sleeping for 2 nights.

## 2024-02-05 NOTE — ED Triage Notes (Signed)
 Spoke with the nurse line and told to come to ED

## 2024-02-06 ENCOUNTER — Other Ambulatory Visit: Payer: Self-pay

## 2024-02-07 ENCOUNTER — Other Ambulatory Visit (HOSPITAL_BASED_OUTPATIENT_CLINIC_OR_DEPARTMENT_OTHER): Payer: Self-pay

## 2024-02-07 ENCOUNTER — Telehealth: Payer: Self-pay

## 2024-02-07 LAB — URINE CULTURE

## 2024-02-07 NOTE — Telephone Encounter (Signed)
 The patient contacted the telephone advice line reporting bone pain. She was advised to visit the emergency department for evaluation. Following assessment, she was diagnosed with a urinary tract infection and prescribed antibiotics. Additionally, she has a new prescription for pain medication, which she reports is currently effective.

## 2024-02-08 ENCOUNTER — Encounter: Payer: Self-pay | Admitting: Psychiatry

## 2024-02-14 ENCOUNTER — Inpatient Hospital Stay: Admitting: Nutrition

## 2024-02-14 ENCOUNTER — Inpatient Hospital Stay

## 2024-02-14 ENCOUNTER — Inpatient Hospital Stay: Attending: Physician Assistant

## 2024-02-14 ENCOUNTER — Ambulatory Visit: Payer: Self-pay | Admitting: Oncology

## 2024-02-14 VITALS — BP 134/57 | HR 79 | Temp 97.9°F | Resp 18 | Wt 302.0 lb

## 2024-02-14 DIAGNOSIS — M898X9 Other specified disorders of bone, unspecified site: Secondary | ICD-10-CM | POA: Insufficient documentation

## 2024-02-14 DIAGNOSIS — C579 Malignant neoplasm of female genital organ, unspecified: Secondary | ICD-10-CM | POA: Insufficient documentation

## 2024-02-14 DIAGNOSIS — Z803 Family history of malignant neoplasm of breast: Secondary | ICD-10-CM | POA: Insufficient documentation

## 2024-02-14 DIAGNOSIS — G893 Neoplasm related pain (acute) (chronic): Secondary | ICD-10-CM | POA: Diagnosis not present

## 2024-02-14 DIAGNOSIS — Z801 Family history of malignant neoplasm of trachea, bronchus and lung: Secondary | ICD-10-CM | POA: Insufficient documentation

## 2024-02-14 DIAGNOSIS — R634 Abnormal weight loss: Secondary | ICD-10-CM | POA: Insufficient documentation

## 2024-02-14 DIAGNOSIS — R971 Elevated cancer antigen 125 [CA 125]: Secondary | ICD-10-CM | POA: Diagnosis not present

## 2024-02-14 DIAGNOSIS — Z5111 Encounter for antineoplastic chemotherapy: Secondary | ICD-10-CM | POA: Insufficient documentation

## 2024-02-14 DIAGNOSIS — Z8049 Family history of malignant neoplasm of other genital organs: Secondary | ICD-10-CM | POA: Insufficient documentation

## 2024-02-14 DIAGNOSIS — C786 Secondary malignant neoplasm of retroperitoneum and peritoneum: Secondary | ICD-10-CM | POA: Diagnosis not present

## 2024-02-14 DIAGNOSIS — Z5189 Encounter for other specified aftercare: Secondary | ICD-10-CM | POA: Diagnosis not present

## 2024-02-14 DIAGNOSIS — Z808 Family history of malignant neoplasm of other organs or systems: Secondary | ICD-10-CM | POA: Diagnosis not present

## 2024-02-14 LAB — CBC WITH DIFFERENTIAL (CANCER CENTER ONLY)
Abs Immature Granulocytes: 0.27 K/uL — ABNORMAL HIGH (ref 0.00–0.07)
Basophils Absolute: 0 K/uL (ref 0.0–0.1)
Basophils Relative: 0 %
Eosinophils Absolute: 0.1 K/uL (ref 0.0–0.5)
Eosinophils Relative: 2 %
HCT: 34.4 % — ABNORMAL LOW (ref 36.0–46.0)
Hemoglobin: 11.2 g/dL — ABNORMAL LOW (ref 12.0–15.0)
Immature Granulocytes: 4 %
Lymphocytes Relative: 16 %
Lymphs Abs: 1.2 K/uL (ref 0.7–4.0)
MCH: 28.4 pg (ref 26.0–34.0)
MCHC: 32.6 g/dL (ref 30.0–36.0)
MCV: 87.3 fL (ref 80.0–100.0)
Monocytes Absolute: 0.4 K/uL (ref 0.1–1.0)
Monocytes Relative: 6 %
Neutro Abs: 5.2 K/uL (ref 1.7–7.7)
Neutrophils Relative %: 72 %
Platelet Count: 183 K/uL (ref 150–400)
RBC: 3.94 MIL/uL (ref 3.87–5.11)
RDW: 14 % (ref 11.5–15.5)
WBC Count: 7.2 K/uL (ref 4.0–10.5)
nRBC: 0 % (ref 0.0–0.2)

## 2024-02-14 NOTE — Telephone Encounter (Signed)
 Patient gave verbal understanding and had no further questions or concerns

## 2024-02-14 NOTE — Addendum Note (Signed)
 Encounter addended by: Janice Lynwood BROCKS on: 02/14/2024 1:30 PM  Actions taken: Imaging Exam ended

## 2024-02-14 NOTE — Telephone Encounter (Signed)
-----   Message from Arley Hof sent at 02/14/2024  2:09 PM EDT ----- Please call patient, blood counts look good, call for a fever, follow-up as scheduled  ----- Message ----- From: Rebecka, Lab In [place] Sent: 02/14/2024  10:18 AM EDT To: Arley KATHEE Hof, MD

## 2024-02-14 NOTE — Progress Notes (Signed)
 72 yo female diagnosed with  Gynecologic cancer and followed by Dr. Cloretta. She is S/p cycle 1 of Carboplatin  and Paclitaxel  on June 25.  PMH includes HTN, Arthritis, Diverticulosis, Obesity, OSA on CPAP.  Medications include MVI, Zofran , Compazine , Senokot.  Labs include Glucose 130 on June 28.  Height: 66 inches. Weight: 302 pounds July 7. Patient weighed 335 pounds April 22. BMI: 48.74.  Patient delighted she did not have nausea or vomiting. She did fluctuate between constipation and diarrhea and was trying to adjust her medications as needed. She has increased bloating, gas and abdominal pain. Drinks about 40 oz. water but that is a struggle. She does not have much of an appetite. Complains of restless legs.  Nutrition Diagnosis: Unintended weight loss related to cancer as evidenced by 10% weight loss in less than 3 months which is clinically significant.  Intervention: Educated to choose higher calorie, high protein foods in small amounts throughout the day. Educated on strategies for eating with constipation and diarrhea. Discussed soluble and insoluble fiber. Discussed strategies to increase fluid intake. Reviewed tips for decreased gas. Nutrition fact sheets provided. Contact information given. Questions answered.  Monitoring, Evaluation, Goals: Tolerate adequate calories and protein to minimize loss of lean body mass.  Next Visit: To be scheduled as needed. Patient encouraged to contact RD with questions or concerns.

## 2024-02-15 ENCOUNTER — Encounter: Payer: Self-pay | Admitting: Oncology

## 2024-02-17 ENCOUNTER — Ambulatory Visit

## 2024-02-17 ENCOUNTER — Inpatient Hospital Stay: Admitting: Genetic Counselor

## 2024-02-17 ENCOUNTER — Encounter: Payer: Self-pay | Admitting: Genetic Counselor

## 2024-02-17 ENCOUNTER — Other Ambulatory Visit: Payer: Self-pay | Admitting: Genetic Counselor

## 2024-02-17 DIAGNOSIS — Z803 Family history of malignant neoplasm of breast: Secondary | ICD-10-CM | POA: Diagnosis not present

## 2024-02-17 DIAGNOSIS — Z8049 Family history of malignant neoplasm of other genital organs: Secondary | ICD-10-CM | POA: Diagnosis not present

## 2024-02-17 DIAGNOSIS — C579 Malignant neoplasm of female genital organ, unspecified: Secondary | ICD-10-CM

## 2024-02-17 DIAGNOSIS — Z1379 Encounter for other screening for genetic and chromosomal anomalies: Secondary | ICD-10-CM

## 2024-02-17 DIAGNOSIS — Z1273 Encounter for screening for malignant neoplasm of ovary: Secondary | ICD-10-CM

## 2024-02-17 LAB — GENETIC SCREENING ORDER

## 2024-02-17 NOTE — Progress Notes (Signed)
 REFERRING PROVIDER: Samie Frederick, PA-C [address] [place],  [place] [address]  PRIMARY PROVIDER:  Samie Frederick, PA-C  PRIMARY REASON FOR VISIT:  Encounter Diagnoses  Name Primary?   Gynecologic cancer [facility]) Yes   Family history of breast cancer    Family history of uterine cancer    HISTORY OF PRESENT ILLNESS:   Susan Frederick, a 73 y.o. female, was seen for a [place] cancer genetics consultation at the request of Dr. Samie due to a personal and family history of cancer.  Susan Frederick presents to clinic today to discuss the possibility of a hereditary predisposition to cancer, to discuss genetic testing, and to further clarify her future cancer risks, as well as potential cancer risks for family members.   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 Her cancer is most likely consistent with ovarian/fallopian tube/primary peritoneal origin.   CANCER HISTORY:  Oncology History  Gynecologic cancer [facility])  01/27/2024 Initial Diagnosis   Gynecologic cancer (HCC)   01/27/2024 Cancer Staging   Staging form: Ovary, Fallopian Tube, and Primary Peritoneal Carcinoma, AJCC 8th Edition - Clinical: FIGO Stage IV (cTX, cNX, cM1) - Signed by Cloretta Arley NOVAK, MD on 01/27/2024 Sites of metastasis: Distant lymph nodes, NOS   02/02/2024 -  Chemotherapy   Patient is on Treatment Plan : OVARIAN Carboplatin  (AUC 6) + Paclitaxel  (175) q21d X 6 Cycles       Past Medical History:  Diagnosis Date   Arthritis    Cancer (HCC)    GYY,METS WITH OMENTUM   DDD (degenerative disc disease), lumbar    Diverticulosis of colon    Hematuria    Hypertension    Mixed stress and urge urinary incontinence    Obesity    OSA on CPAP     Past Surgical History:  Procedure Laterality Date   ANKLE FUSION WITH GASTROC SLIDE Left 2010   COLONOSCOPY     HAND TENDON SURGERY     right   IR IMAGING GUIDED PORT INSERTION  01/18/2024   KNEE ARTHROSCOPY      left   NOSE SURGERY     deviated septum   POLYPECTOMY     ROOT CANAL  2025   TOE SURGERY     right foot/ little toe   WISDOM TOOTH EXTRACTION  08/11/1967    Social History   Socioeconomic History   Marital status: Single    Spouse name: Not on file   Number of children: Not on file   Years of education: Not on file   Highest education level: Not on file  Occupational History   Not on file  Tobacco Use   Smoking status: Former    Current packs/day: 0.00    Average packs/day: 1 pack/day for 17.0 years (17.0 ttl pk-yrs)    Types: Cigarettes    Start date: 89    Quit date: 56    Years since quitting: 39.5   Smokeless tobacco: Never   Tobacco comments:    Smoked for 18 years, quit in 1986.   Vaping Use   Vaping status: Never Used  Substance and Sexual Activity   Alcohol use: Not Currently    Comment: socially   Drug use: No   Sexual activity: Never  Other Topics Concern   Not on file  Social History Narrative   Not on file   Social Drivers of Health   Financial Resource Strain: Low Risk  (  12/18/2023)   Received from [facility]   Overall Financial Resource Strain (CARDIA)    Difficulty of Paying Living Expenses: Not hard at all  Food Insecurity: No Food Insecurity (01/27/2024)   Hunger Vital Sign    Worried About Running Out of Food in the Last Year: Never true    Ran Out of Food in the Last Year: Never true  Transportation Needs: No Transportation Needs (01/27/2024)   PRAPARE - Administrator, Civil Service (Medical): No    Lack of Transportation (Non-Medical): No  Physical Activity: Inactive (12/18/2023)   Received from [facility]   Exercise Vital Sign    On average, how many days per week do you engage in moderate to strenuous exercise (like a brisk walk)?: 0 days    On average, how many minutes do you engage in exercise at this level?: 60 min  Stress: No Stress Concern Present (12/18/2023)   Received from [facility] - Occupational Stress Questionnaire    Feeling of Stress : Not at all  Social Connections: Moderately Integrated (12/18/2023)   Received from [facility]   Social Network    How would you rate your social network (family, work, friends)?: Adequate participation with social networks     FAMILY HISTORY:  We obtained a detailed, 4-generation family history.  Significant diagnoses are listed below: Family History  Problem Relation Age of Onset   Endometrial cancer Sister 41       paternal half sister   Skin cancer Sister        basal/squamous   Lung cancer Maternal Aunt    Breast cancer Maternal Grandmother 68 - 66   Throat cancer Maternal Grandfather 40 - 48   Ovarian cancer Neg Hx    Colon cancer Neg Hx       Susan Frederick is unaware of previous family history of genetic testing for hereditary cancer risks. She reports her sister's oncologist told her that her endometrial cancer was not hereditary, but Susan Frederick does not believe her sister had any genetic testing. There is no reported Ashkenazi Jewish ancestry.   GENETIC COUNSELING ASSESSMENT: Susan Frederick is a 73 y.o. female with a personal and family history of cancer which is somewhat suggestive of a hereditary predisposition to cancer. We, therefore, discussed and recommended the following at today's visit.   DISCUSSION: We discussed that 5 - 10% of cancer is hereditary, with most cases of ovarian/fallopian tube/peritoneal cancer associated with BRCA1/2.  There are other genes that can be associated with hereditary cancer syndromes.  We discussed that testing is beneficial for several reasons including knowing how to follow individuals after completing their treatment, identifying whether potential treatment options would be beneficial, and understanding if other family members could be at risk for cancer and allowing them to undergo genetic testing.   We reviewed the characteristics, features and inheritance  patterns of hereditary cancer syndromes. We also discussed genetic testing, including the appropriate family members to test, the process of testing, insurance coverage and turn-around-time for results. We discussed the implications of a negative, positive, carrier and/or variant of uncertain significant result. We recommended Susan Frederick pursue genetic testing for a panel that includes genes associated with cancer.   Susan Frederick elected to have [facility] CancerNext Panel+RNA. The [facility] CancerNext+RNAinsight Panel includes sequencing, rearrangement analysis, and RNA analysis for the following 40 genes: APC, ATM, BAP1, BARD1, BMPR1A, BRCA1, BRCA2, BRIP1, CDH1, CDKN2A, CHEK2, FH, FLCN, MET,  MLH1, MSH2, MSH6, MUTYH, NF1, NTHL1, PALB2, PMS2, PTEN, RAD51C, RAD51D, RPS20, SMAD4, STK11, TP53, TSC1, TSC2, and VHL (sequencing and deletion/duplication); AXIN2, HOXB13, MBD4, MSH3, POLD1 and POLE (sequencing only); EPCAM and GREM1 (deletion/duplication only).  Based on Susan Frederick personal and family history of cancer, she meets medical criteria for genetic testing. Despite that she meets criteria, she may still have an out of pocket cost. We discussed that if her out of pocket cost for testing is over $100, the laboratory should contact them to discuss self-pay prices, patient pay assistance programs, if applicable, and other billing options.  PLAN: After considering the risks, benefits, and limitations, Susan Frederick to pursue genetic testing and the blood sample was sent to [facility] for analysis of the CancerNext Panel. Results should be available within approximately 2-3 weeks' time, at which point they will be disclosed by telephone to Susan Frederick, as will any additional recommendations warranted by these results. Susan Frederick will receive a summary of her genetic counseling visit and a copy of her results once available. This information will also be available in Epic.   Susan Frederick were answered to  her satisfaction today. Our contact information was provided should additional Frederick or concerns arise. Thank you for the referral and allowing us  to share in the care of your patient.   Tamyka Bezio, MS, [facility] Genetic Counselor [email or web address] (P) [phone]  50 minutes were spent on the date of the encounter in service to the patient including preparation, face-to-face consultation, documentation and care coordination.  The patient was seen alone.  Drs. Gudena and/or Lanny were available to discuss this case as needed.   _______________________________________________________________________ For Office Staff:  Number of people involved in session: 1 Was an Intern/ student involved with case: no

## 2024-02-20 ENCOUNTER — Other Ambulatory Visit: Payer: Self-pay | Admitting: Oncology

## 2024-02-21 ENCOUNTER — Encounter: Payer: Self-pay | Admitting: Oncology

## 2024-02-23 ENCOUNTER — Encounter: Payer: Self-pay | Admitting: Nurse Practitioner

## 2024-02-23 ENCOUNTER — Inpatient Hospital Stay

## 2024-02-23 ENCOUNTER — Inpatient Hospital Stay: Admitting: Nurse Practitioner

## 2024-02-23 VITALS — BP 123/86 | HR 96 | Temp 97.7°F | Resp 17 | Ht 66.0 in | Wt 296.6 lb

## 2024-02-23 DIAGNOSIS — C579 Malignant neoplasm of female genital organ, unspecified: Secondary | ICD-10-CM

## 2024-02-23 DIAGNOSIS — Z5111 Encounter for antineoplastic chemotherapy: Secondary | ICD-10-CM | POA: Diagnosis not present

## 2024-02-23 LAB — CBC WITH DIFFERENTIAL (CANCER CENTER ONLY)
Abs Immature Granulocytes: 0.06 K/uL (ref 0.00–0.07)
Basophils Absolute: 0.1 K/uL (ref 0.0–0.1)
Basophils Relative: 2 %
Eosinophils Absolute: 0.2 K/uL (ref 0.0–0.5)
Eosinophils Relative: 3 %
HCT: 34.3 % — ABNORMAL LOW (ref 36.0–46.0)
Hemoglobin: 11 g/dL — ABNORMAL LOW (ref 12.0–15.0)
Immature Granulocytes: 1 %
Lymphocytes Relative: 22 %
Lymphs Abs: 1.2 K/uL (ref 0.7–4.0)
MCH: 28.5 pg (ref 26.0–34.0)
MCHC: 32.1 g/dL (ref 30.0–36.0)
MCV: 88.9 fL (ref 80.0–100.0)
Monocytes Absolute: 0.5 K/uL (ref 0.1–1.0)
Monocytes Relative: 9 %
Neutro Abs: 3.7 K/uL (ref 1.7–7.7)
Neutrophils Relative %: 63 %
Platelet Count: 309 K/uL (ref 150–400)
RBC: 3.86 MIL/uL — ABNORMAL LOW (ref 3.87–5.11)
RDW: 15.1 % (ref 11.5–15.5)
WBC Count: 5.7 K/uL (ref 4.0–10.5)
nRBC: 0 % (ref 0.0–0.2)

## 2024-02-23 LAB — CMP (CANCER CENTER ONLY)
ALT: 14 U/L (ref 0–44)
AST: 22 U/L (ref 15–41)
Albumin: 4.1 g/dL (ref 3.5–5.0)
Alkaline Phosphatase: 111 U/L (ref 38–126)
Anion gap: 15 (ref 5–15)
BUN: 15 mg/dL (ref 8–23)
CO2: 22 mmol/L (ref 22–32)
Calcium: 9.4 mg/dL (ref 8.9–10.3)
Chloride: 102 mmol/L (ref 98–111)
Creatinine: 0.78 mg/dL (ref 0.44–1.00)
GFR, Estimated: >60 mL/min (ref >60)
Glucose, Bld: 145 mg/dL — ABNORMAL HIGH (ref 70–99)
Potassium: 3.9 mmol/L (ref 3.5–5.1)
Sodium: 139 mmol/L (ref 135–145)
Total Bilirubin: 0.4 mg/dL (ref 0.0–1.2)
Total Protein: 7.4 g/dL (ref 6.5–8.1)

## 2024-02-23 MED ORDER — CETIRIZINE HCL 10 MG/ML IV SOLN
10.0000 mg | Freq: Once | INTRAVENOUS | Status: AC
  Administered 2024-02-23: 10 mg via INTRAVENOUS
  Filled 2024-02-23: qty 1

## 2024-02-23 MED ORDER — FAMOTIDINE IN NACL 20-0.9 MG/50ML-% IV SOLN
20.0000 mg | Freq: Once | INTRAVENOUS | Status: AC
  Administered 2024-02-23: 20 mg via INTRAVENOUS
  Filled 2024-02-23: qty 50

## 2024-02-23 MED ORDER — SODIUM CHLORIDE 0.9 % IV SOLN
677.0000 mg | Freq: Once | INTRAVENOUS | Status: AC
  Administered 2024-02-23: 680 mg via INTRAVENOUS
  Filled 2024-02-23: qty 68

## 2024-02-23 MED ORDER — SODIUM CHLORIDE 0.9% FLUSH
10.0000 mL | INTRAVENOUS | Status: DC | PRN
  Administered 2024-02-23: 10 mL

## 2024-02-23 MED ORDER — HEPARIN SOD (PORK) LOCK FLUSH 100 UNIT/ML IV SOLN
500.0000 [IU] | Freq: Once | INTRAVENOUS | Status: AC | PRN
  Administered 2024-02-23: 500 [IU]

## 2024-02-23 MED ORDER — SODIUM CHLORIDE 0.9 % IV SOLN: INTRAVENOUS | Status: DC

## 2024-02-23 MED ORDER — DEXAMETHASONE SODIUM PHOSPHATE 10 MG/ML IJ SOLN
10.0000 mg | Freq: Once | INTRAMUSCULAR | Status: AC
  Administered 2024-02-23: 10 mg via INTRAVENOUS
  Filled 2024-02-23: qty 1

## 2024-02-23 MED ORDER — SODIUM CHLORIDE 0.9 % IV SOLN
175.0000 mg/m2 | Freq: Once | INTRAVENOUS | Status: AC
  Administered 2024-02-23: 444 mg via INTRAVENOUS
  Filled 2024-02-23: qty 74

## 2024-02-23 MED ORDER — SODIUM CHLORIDE 0.9 % IV SOLN
150.0000 mg | Freq: Once | INTRAVENOUS | Status: AC
  Administered 2024-02-23: 150 mg via INTRAVENOUS
  Filled 2024-02-23: qty 150

## 2024-02-23 MED ORDER — PALONOSETRON HCL INJECTION 0.25 MG/5ML
0.2500 mg | Freq: Once | INTRAVENOUS | Status: AC
  Administered 2024-02-23: 0.25 mg via INTRAVENOUS
  Filled 2024-02-23: qty 5

## 2024-02-23 NOTE — Progress Notes (Signed)
  Susan Frederick   Diagnosis: Gynecologic cancer  INTERVAL HISTORY:   Susan Frederick returns as scheduled.  She completed cycle 1 paclitaxel /carboplatin  02/02/2024.  She denies nausea/vomiting.  No mouth sores.  Around day 6 or 7 she had multiple loose stools.  She took Imodium twice with resolution.  She denies signs of an allergic reaction.  She notes an intermittent cold sensation and tingling in her toes.  Appetite is better.  She had restless legs after premeds for cycle 1.  She thinks this was due to the Benadryl .  Right around the time of the Udenyca  injection she developed bone pain.  The pain worsened.  She presented to the emergency department for evaluation on 02/05/2024.  She reports receiving IV pain medication.  She was prescribed morphine  and had oxycodone  at home.  She noted fairly good relief with both.  Appetite is better.  Ankles felt weak this morning.  Objective:  Vital signs in last 24 hours:  Blood pressure 123/86, pulse 96, temperature 97.7 F (36.5 C), temperature source Temporal, resp. rate 17, height 5' 6 (1.676 m), weight 296 lb 9.6 oz (134.5 kg), SpO2 99%.    HEENT: No thrush or ulcers. Resp: Lungs clear bilaterally. Cardio: Regular rate and rhythm. GI: Diffuse tenderness.  No mass.  No apparent ascites.  No hepatosplenomegaly. Vascular: No leg edema. Skin: No rash. Port-A-Cath without erythema.  Lab Results:  Lab Results  Component Value Date   WBC 5.7 02/23/2024   HGB 11.0 (L) 02/23/2024   HCT 34.3 (L) 02/23/2024   MCV 88.9 02/23/2024   PLT 309 02/23/2024   NEUTROABS 3.7 02/23/2024    Imaging:  No results found.  Medications: I have reviewed the patient's current medications.  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) 12/27/2023: CT abdomen/pelvis-extensive abnormal diffuse stranding and nodularity of the omentum and mesentery, small volume ascites, small periumbilical hernia with soft  tissue nodularity, enlarged aortocaval retroperitoneal node 01/05/2024: PET-hypermetabolic lymph nodes in the mediastinum, bilateral internal mammary, and right pericardial areas, diffuse carcinomatosis, small hypermetabolic retroperitoneal nodes, no primary lesion identified 01/18/2024: CT-guided biopsy of left peritoneal tumor-metastatic high-grade serous carcinoma, PAX8 and WT1 positive Elevated CA125 Cycle 1 paclitaxel /carboplatin  02/02/2024, Udenyca  Cycle 2 paclitaxel /carboplatin  02/23/2024, Udenyca    Abdominal pain secondary to 1 Anorexia/weight loss secondary to #1 Hypertension Arthritis Benign endocervix polyp 2019 Family history of breast, endometrial, lung, and head neck cance  Disposition: [name] appears stable.  She has completed 1 cycle of paclitaxel /carboplatin .  She developed fairly significant bone pain around the time of the white cell growth factor injection.  She was evaluated in the emergency department.  Narcotics helped relieve the pain.  She understands the bone pain could have been related to G-CSF or Taxol  and may occur again.  She has oxycodone  on hand at home.  Other than bone pain she seems to have tolerated the first cycle of chemotherapy well.  Plan to proceed with cycle 2 paclitaxel /carboplatin  today as scheduled, G-CSF day 3.  She will contact the office with poorly controlled pain.  CBC and chemistry panel reviewed.  Labs adequate for treatment.  She will return for follow-up and cycle 3 paclitaxel /carboplatin  in 3 weeks.  We are available to see her sooner if needed.    Susan Frederick ANP/GNP-BC   02/23/2024  10:00 AM

## 2024-02-23 NOTE — Progress Notes (Signed)
 Patient seen by Olam Ned NP today  Vitals are within treatment parameters:Yes  Decreased 11 pounds Labs are within treatment parameters: Yes   Treatment plan has been signed: Yes   Per physician team, Patient is ready for treatment and there are NO modifications to the treatment plan.

## 2024-02-23 NOTE — Patient Instructions (Signed)
 [facility] - A DEPT OF [facility]  Discharge Instructions: Thank you for choosing [facility] to provide your oncology and hematology care.   If you have a lab appointment with the Cancer Center, please go directly to the Cancer Center and check in at the registration area.   Wear comfortable clothing and clothing appropriate for easy access to any Portacath or PICC line.   We strive to give you quality time with your provider. You may need to reschedule your appointment if you arrive late (15 or more minutes).  Arriving late affects you and other patients whose appointments are after yours.  Also, if you miss three or more appointments without notifying the office, you may be dismissed from the clinic at the provider's discretion.      For prescription refill requests, have your pharmacy contact our office and allow 72 hours for refills to be completed.    Today you received the following chemotherapy and/or immunotherapy agents :  Taxol ,  Carboplatin .   To help prevent nausea and vomiting after your treatment, we encourage you to take your nausea medication as directed.  BELOW ARE SYMPTOMS THAT SHOULD BE REPORTED IMMEDIATELY: *FEVER GREATER THAN 100.4 F (38 C) OR HIGHER *CHILLS OR SWEATING *NAUSEA AND VOMITING THAT IS NOT CONTROLLED WITH YOUR NAUSEA MEDICATION *UNUSUAL SHORTNESS OF BREATH *UNUSUAL BRUISING OR BLEEDING *URINARY PROBLEMS (pain or burning when urinating, or frequent urination) *BOWEL PROBLEMS (unusual diarrhea, constipation, pain near the anus) TENDERNESS IN MOUTH AND THROAT WITH OR WITHOUT PRESENCE OF ULCERS (sore throat, sores in mouth, or a toothache) UNUSUAL RASH, SWELLING OR PAIN  UNUSUAL VAGINAL DISCHARGE OR ITCHING   Items with * indicate a potential emergency and should be followed up as soon as possible or go to the Emergency Department if any problems should occur.  Please show the CHEMOTHERAPY ALERT CARD or  IMMUNOTHERAPY ALERT CARD at check-in to the Emergency Department and triage nurse.  Should you have questions after your visit or need to cancel or reschedule your appointment, please contact [facility] - A DEPT OF [facility]  Dept: [phone]  and follow the prompts.  Office hours are 8:00 a.m. to 4:30 p.m. Monday - Friday. Please note that voicemails left after 4:00 p.m. may not be returned until the following business day.  We are closed weekends and major holidays. You have access to a nurse at all times for urgent questions. Please call the main number to the clinic Dept: [phone] and follow the prompts.   For any non-urgent questions, you may also contact your provider using MyChart. We now offer e-Visits for anyone 46 and older to request care online for non-urgent symptoms. For details visit [email or web address].   Also download the MyChart app! Go to the app store, search MyChart, open the app, select [place], and log in with your MyChart username and password.

## 2024-02-24 ENCOUNTER — Other Ambulatory Visit: Payer: Self-pay

## 2024-02-25 ENCOUNTER — Inpatient Hospital Stay

## 2024-02-25 VITALS — BP 139/67 | HR 77 | Temp 97.5°F | Resp 18

## 2024-02-25 DIAGNOSIS — C579 Malignant neoplasm of female genital organ, unspecified: Secondary | ICD-10-CM

## 2024-02-25 DIAGNOSIS — Z5111 Encounter for antineoplastic chemotherapy: Secondary | ICD-10-CM | POA: Diagnosis not present

## 2024-02-25 MED ORDER — PEGFILGRASTIM-CBQV 6 MG/0.6ML ~~LOC~~ SOSY
6.0000 mg | PREFILLED_SYRINGE | Freq: Once | SUBCUTANEOUS | Status: AC
  Administered 2024-02-25: 6 mg via SUBCUTANEOUS
  Filled 2024-02-25: qty 0.6

## 2024-02-25 NOTE — Patient Instructions (Signed)

## 2024-03-01 ENCOUNTER — Encounter: Payer: Self-pay | Admitting: Oncology

## 2024-03-02 ENCOUNTER — Encounter: Payer: Self-pay | Admitting: Genetic Counselor

## 2024-03-02 ENCOUNTER — Telehealth: Payer: Self-pay | Admitting: Genetic Counselor

## 2024-03-02 DIAGNOSIS — Z1379 Encounter for other screening for genetic and chromosomal anomalies: Secondary | ICD-10-CM | POA: Insufficient documentation

## 2024-03-02 NOTE — Telephone Encounter (Signed)
 I contacted [name] to discuss her genetic testing results. No pathogenic variants were identified in the 40 genes analyzed. Detailed clinic note to follow.  The test report has been scanned into EPIC and is located under the Molecular Pathology section of the Results Review tab.  A portion of the result report is included below for reference.   Julyssa Kyer, MS, [facility] Genetic Counselor [email or web address] (P) [phone]

## 2024-03-03 ENCOUNTER — Ambulatory Visit: Payer: Self-pay | Admitting: Genetic Counselor

## 2024-03-03 DIAGNOSIS — Z1379 Encounter for other screening for genetic and chromosomal anomalies: Secondary | ICD-10-CM

## 2024-03-03 NOTE — Progress Notes (Signed)
 HPI:   Susan Frederick was previously seen in the [facility] due to a personal and family history of cancer and concerns regarding a hereditary predisposition to cancer. Please refer to our prior cancer genetics clinic note for more information regarding our discussion, assessment and recommendations, at the time. Susan Frederick recent genetic test results were disclosed to her, as were recommendations warranted by these results. These results and recommendations are discussed in more detail below.  CANCER HISTORY:  Oncology History  Gynecologic cancer [facility])  01/27/2024 Initial Diagnosis   Gynecologic cancer (HCC)   01/27/2024 Cancer Staging   Staging form: Ovary, Fallopian Tube, and Primary Peritoneal Carcinoma, AJCC 8th Edition - Clinical: FIGO Stage IV (cTX, cNX, cM1) - Signed by Cloretta Arley NOVAK, MD on 01/27/2024 Sites of metastasis: Distant lymph nodes, NOS   02/02/2024 -  Chemotherapy   Patient is on Treatment Plan : OVARIAN Carboplatin  (AUC 6) + Paclitaxel  (175) q21d X 6 Cycles       FAMILY HISTORY:  We obtained a detailed, 4-generation family history.  Significant diagnoses are listed below:      Family History  Problem Relation Age of Onset   Endometrial cancer Sister 23        paternal half sister   Skin cancer Sister          basal/squamous   Lung cancer Maternal Aunt     Breast cancer Maternal Grandmother 2 - 66   Throat cancer Maternal Grandfather 59 - 48   Ovarian cancer Neg Hx     Colon cancer Neg Hx               Susan Frederick is unaware of previous family history of genetic testing for hereditary cancer risks. She reports her sister's oncologist told her that her endometrial cancer was not hereditary, but Susan Frederick does not believe her sister had any genetic testing. There is no reported Ashkenazi Jewish ancestry.    GENETIC TEST RESULTS:  The [facility] CancerNext Panel found no pathogenic mutations.   The [facility] CancerNext+RNAinsight Panel includes sequencing,  rearrangement analysis, and RNA analysis for the following 40 genes: APC, ATM, BAP1, BARD1, BMPR1A, BRCA1, BRCA2, BRIP1, CDH1, CDKN2A, CHEK2, FH, FLCN, MET, MLH1, MSH2, MSH6, MUTYH, NF1, NTHL1, PALB2, PMS2, PTEN, RAD51C, RAD51D, RPS20, SMAD4, STK11, TP53, TSC1, TSC2, and VHL (sequencing and deletion/duplication); AXIN2, HOXB13, MBD4, MSH3, POLD1 and POLE (sequencing only); EPCAM and GREM1 (deletion/duplication only).  The test report has been scanned into EPIC and is located under the Molecular Pathology section of the Results Review tab.  A portion of the result report is included below for reference. Genetic testing reported out on 02/29/2024.         Even though a pathogenic variant was not identified, possible explanations for the cancer in the family may include: There may be no hereditary risk for cancer in the family. The cancers in Susan Frederick and/or her family may be due to other genetic or environmental factors. There may be a gene mutation in one of these genes that current testing methods cannot detect, but that chance is small. There could be another gene that has not yet been discovered, or that we have not yet tested, that is responsible for the cancer diagnoses in the family.  It is also possible there is a hereditary cause for the cancer in the family that Susan Frederick did not inherit.  Therefore, it is important to remain in touch with cancer genetics in the future  so that we can continue to offer Susan Frederick the most up to date genetic testing.   ADDITIONAL GENETIC TESTING:  We discussed with Susan Frederick that her genetic testing was fairly extensive.  If there are genes identified to increase cancer risk that can be analyzed in the future, we would be happy to discuss and coordinate this testing at that time.    CANCER SCREENING RECOMMENDATIONS:  Susan Frederick test result is considered negative (normal).  This means that we have not identified a hereditary cause for her personal and family history  of cancer at this time.   An individual's cancer risk and medical management are not determined by genetic test results alone. Overall cancer risk assessment incorporates additional factors, including personal medical history, family history, and any available genetic information that may result in a personalized plan for cancer prevention and surveillance. Therefore, it is recommended she continue to follow the cancer management and screening guidelines provided by her oncology and primary healthcare provider.  FOLLOW-UP:  Cancer genetics is a rapidly advancing field and it is possible that new genetic tests will be appropriate for her and/or her family members in the future. We encouraged her to remain in contact with cancer genetics on an annual basis so we can update her personal and family histories and let her know of advances in cancer genetics that may benefit this family.   Our contact number was provided. Susan Frederick questions were answered to her satisfaction, and she knows she is welcome to call us  at anytime with additional questions or concerns.   Julius Matus, MS, [facility] Genetic Counselor [email or web address] (P) [phone]

## 2024-03-08 ENCOUNTER — Telehealth: Payer: Self-pay | Admitting: Oncology

## 2024-03-08 ENCOUNTER — Encounter: Payer: Self-pay | Admitting: *Deleted

## 2024-03-08 NOTE — Progress Notes (Signed)
 Wants to move her lab/flush/OV to 8/5 to discuss some chemo side effects she has been having before chemo. Scheduling message sent to see NP at 0845 on 8/5

## 2024-03-08 NOTE — Telephone Encounter (Signed)
 PT called with questions about CT and sooner appt. Soonest available at [facility], confirmed appt with PT.

## 2024-03-09 ENCOUNTER — Inpatient Hospital Stay: Admitting: Dietician

## 2024-03-09 ENCOUNTER — Other Ambulatory Visit (HOSPITAL_BASED_OUTPATIENT_CLINIC_OR_DEPARTMENT_OTHER): Payer: Self-pay

## 2024-03-09 MED ORDER — AMOXICILLIN-POT CLAVULANATE 875-125 MG PO TABS
1.0000 | ORAL_TABLET | Freq: Two times a day (BID) | ORAL | 1 refills | Status: DC
  Filled 2024-03-09: qty 20, 10d supply, fill #0

## 2024-03-09 NOTE — Progress Notes (Signed)
 Nutrition Follow Up : Reached out to patient at home telephone number.    Appetite is increased "quite a bit." Eating better overall other than the week after infusion. Bowels are fluctuating between loose stools and constipation. Using Miralax as needed. PO intake lately: oatmeal, peanut butter sometimes eggs with bacon Pork tenderloin, or chicken, mashes potatoes with vegetables (green beans, spinach, broccoli)  Cherries, apples, Mandarin oranges Fluids: trying to force water but only hitting 40oz (water, lemonade, coffee 12oz, Decaf Diet Coke) Premier Protein (1/2 carton), trying to do more yogurts (doesn't really like).  Medications: Stopped using senna, uses Miralax as needed  Labs: 02/23/24  Hgb 11.0     Anthropometrics:  weight loss 11# past month  Height: 66 Weight: 02/23/24  296.6# 01/27/24  307.7# UBW: 335# in April BMI: 48.74   Estimated Energy Needs  Kcals: 2800-3400 Protein: 135-162 g Fluid: 3.5 L   NUTRITION DIAGNOSIS: Unintended weight loss related to cancer as evidenced by 10% weight loss in less than 3 months which is clinically significant.  Resolving with reported increase in PO intake.  INTERVENTION:   Discussed ways to address constipation. Encouraged adequate hydration. Discussed strategies for improving Hgb Emailed Nutrition Tip sheet for Anemia with Contact information provided.  MONITORING, EVALUATION, GOAL: weight trends, nutrition impact symptoms, PO intake, labs   Next Visit: PRN at patient or provider request.  Micheline Craven, RDN, LDN Registered Dietitian, [facility] Part Time Remote (Usual office hours: Tuesday-Thursday) Mobile: [phone]

## 2024-03-10 ENCOUNTER — Other Ambulatory Visit (HOSPITAL_BASED_OUTPATIENT_CLINIC_OR_DEPARTMENT_OTHER): Payer: Self-pay

## 2024-03-11 ENCOUNTER — Other Ambulatory Visit: Payer: Self-pay | Admitting: Oncology

## 2024-03-13 ENCOUNTER — Ambulatory Visit

## 2024-03-13 ENCOUNTER — Encounter: Admitting: Genetic Counselor

## 2024-03-14 ENCOUNTER — Other Ambulatory Visit (HOSPITAL_BASED_OUTPATIENT_CLINIC_OR_DEPARTMENT_OTHER): Payer: Self-pay

## 2024-03-14 ENCOUNTER — Inpatient Hospital Stay

## 2024-03-14 ENCOUNTER — Inpatient Hospital Stay: Admitting: Nurse Practitioner

## 2024-03-14 ENCOUNTER — Inpatient Hospital Stay: Attending: Physician Assistant

## 2024-03-14 ENCOUNTER — Other Ambulatory Visit: Payer: Self-pay

## 2024-03-14 ENCOUNTER — Encounter: Payer: Self-pay | Admitting: Nurse Practitioner

## 2024-03-14 VITALS — BP 126/77 | HR 91 | Temp 97.8°F | Resp 18 | Ht 66.0 in | Wt 293.4 lb

## 2024-03-14 DIAGNOSIS — G893 Neoplasm related pain (acute) (chronic): Secondary | ICD-10-CM | POA: Diagnosis not present

## 2024-03-14 DIAGNOSIS — C579 Malignant neoplasm of female genital organ, unspecified: Secondary | ICD-10-CM

## 2024-03-14 DIAGNOSIS — Z803 Family history of malignant neoplasm of breast: Secondary | ICD-10-CM | POA: Insufficient documentation

## 2024-03-14 DIAGNOSIS — Z8049 Family history of malignant neoplasm of other genital organs: Secondary | ICD-10-CM | POA: Insufficient documentation

## 2024-03-14 DIAGNOSIS — Z801 Family history of malignant neoplasm of trachea, bronchus and lung: Secondary | ICD-10-CM | POA: Insufficient documentation

## 2024-03-14 DIAGNOSIS — R634 Abnormal weight loss: Secondary | ICD-10-CM | POA: Diagnosis not present

## 2024-03-14 DIAGNOSIS — Z5111 Encounter for antineoplastic chemotherapy: Secondary | ICD-10-CM | POA: Insufficient documentation

## 2024-03-14 DIAGNOSIS — R971 Elevated cancer antigen 125 [CA 125]: Secondary | ICD-10-CM | POA: Diagnosis not present

## 2024-03-14 DIAGNOSIS — D696 Thrombocytopenia, unspecified: Secondary | ICD-10-CM | POA: Insufficient documentation

## 2024-03-14 DIAGNOSIS — Z87891 Personal history of nicotine dependence: Secondary | ICD-10-CM | POA: Insufficient documentation

## 2024-03-14 DIAGNOSIS — I10 Essential (primary) hypertension: Secondary | ICD-10-CM | POA: Insufficient documentation

## 2024-03-14 DIAGNOSIS — M1990 Unspecified osteoarthritis, unspecified site: Secondary | ICD-10-CM | POA: Diagnosis not present

## 2024-03-14 DIAGNOSIS — Z800 Family history of malignant neoplasm of digestive organs: Secondary | ICD-10-CM | POA: Insufficient documentation

## 2024-03-14 DIAGNOSIS — C482 Malignant neoplasm of peritoneum, unspecified: Secondary | ICD-10-CM | POA: Insufficient documentation

## 2024-03-14 DIAGNOSIS — Z5189 Encounter for other specified aftercare: Secondary | ICD-10-CM | POA: Diagnosis not present

## 2024-03-14 DIAGNOSIS — C778 Secondary and unspecified malignant neoplasm of lymph nodes of multiple regions: Secondary | ICD-10-CM | POA: Diagnosis not present

## 2024-03-14 DIAGNOSIS — M898X9 Other specified disorders of bone, unspecified site: Secondary | ICD-10-CM | POA: Insufficient documentation

## 2024-03-14 LAB — CMP (CANCER CENTER ONLY)
ALT: 17 U/L (ref 0–44)
AST: 18 U/L (ref 15–41)
Albumin: 4 g/dL (ref 3.5–5.0)
Alkaline Phosphatase: 114 U/L (ref 38–126)
Anion gap: 12 (ref 5–15)
BUN: 14 mg/dL (ref 8–23)
CO2: 25 mmol/L (ref 22–32)
Calcium: 9.9 mg/dL (ref 8.9–10.3)
Chloride: 101 mmol/L (ref 98–111)
Creatinine: 0.69 mg/dL (ref 0.44–1.00)
GFR, Estimated: >60 mL/min (ref >60)
Glucose, Bld: 123 mg/dL — ABNORMAL HIGH (ref 70–99)
Potassium: 3.9 mmol/L (ref 3.5–5.1)
Sodium: 138 mmol/L (ref 135–145)
Total Bilirubin: 0.4 mg/dL (ref 0.0–1.2)
Total Protein: 7 g/dL (ref 6.5–8.1)

## 2024-03-14 LAB — CBC WITH DIFFERENTIAL (CANCER CENTER ONLY)
Abs Immature Granulocytes: 0.02 K/uL (ref 0.00–0.07)
Basophils Absolute: 0.1 K/uL (ref 0.0–0.1)
Basophils Relative: 1 %
Eosinophils Absolute: 0.1 K/uL (ref 0.0–0.5)
Eosinophils Relative: 2 %
HCT: 31.2 % — ABNORMAL LOW (ref 36.0–46.0)
Hemoglobin: 10.3 g/dL — ABNORMAL LOW (ref 12.0–15.0)
Immature Granulocytes: 1 %
Lymphocytes Relative: 29 %
Lymphs Abs: 1.1 K/uL (ref 0.7–4.0)
MCH: 29.7 pg (ref 26.0–34.0)
MCHC: 33 g/dL (ref 30.0–36.0)
MCV: 89.9 fL (ref 80.0–100.0)
Monocytes Absolute: 0.4 K/uL (ref 0.1–1.0)
Monocytes Relative: 9 %
Neutro Abs: 2.2 K/uL (ref 1.7–7.7)
Neutrophils Relative %: 58 %
Platelet Count: 126 K/uL — ABNORMAL LOW (ref 150–400)
RBC: 3.47 MIL/uL — ABNORMAL LOW (ref 3.87–5.11)
RDW: 18.3 % — ABNORMAL HIGH (ref 11.5–15.5)
WBC Count: 3.8 K/uL — ABNORMAL LOW (ref 4.0–10.5)
nRBC: 0 % (ref 0.0–0.2)

## 2024-03-14 MED ORDER — LEVOCETIRIZINE DIHYDROCHLORIDE 5 MG PO TABS: 5.0000 mg | ORAL_TABLET | Freq: Every evening | ORAL | 1 refills | Status: DC

## 2024-03-14 MED ORDER — LORAZEPAM 0.5 MG PO TABS
0.5000 mg | ORAL_TABLET | Freq: Every evening | ORAL | 0 refills | Status: DC | PRN
  Filled 2024-03-14: qty 30, 30d supply, fill #0

## 2024-03-14 MED ORDER — AMOXICILLIN 500 MG PO CAPS: ORAL_CAPSULE | ORAL | 1 refills | Status: DC

## 2024-03-14 MED ORDER — SERTRALINE HCL 50 MG PO TABS
50.0000 mg | ORAL_TABLET | Freq: Every day | ORAL | 1 refills | Status: DC
  Filled 2024-03-14: qty 90, 90d supply, fill #0

## 2024-03-14 MED ORDER — LISINOPRIL 20 MG PO TABS
20.0000 mg | ORAL_TABLET | Freq: Every day | ORAL | 3 refills | Status: DC
  Filled 2024-03-14: qty 90, 90d supply, fill #0

## 2024-03-14 MED ORDER — SEMAGLUTIDE-WEIGHT MANAGEMENT 0.25 MG/0.5ML ~~LOC~~ SOAJ: 0.2500 mg | SUBCUTANEOUS | 0 refills | Status: DC

## 2024-03-14 MED ORDER — MELOXICAM 15 MG PO TABS: 15.0000 mg | ORAL_TABLET | Freq: Every day | ORAL | 2 refills | Status: DC | PRN

## 2024-03-14 NOTE — Patient Instructions (Signed)

## 2024-03-14 NOTE — Progress Notes (Unsigned)
 [facility] OFFICE PROGRESS NOTE   Diagnosis: Gynecologic cancer  INTERVAL HISTORY:   Ms. Cothran returns as scheduled.  She completed cycle 2 Taxol /carboplatin  02/23/2024.  She denies nausea/vomiting.  No mouth sores.  She has intermittent constipation relieved with senna and MiraLAX.  She intermittently notes a cold sensation in her toes at nighttime.  She notes intermittent restless legs at nighttime, difficulty falling asleep.  She had joint pain in the knees and ankles after chemo, before G-CSF.  Pain was less as compared to following cycle 1.  Appetite is better.  Objective:  Vital signs in last 24 hours:  Blood pressure 126/77, pulse 91, temperature 97.8 F (36.6 C), temperature source Temporal, resp. rate 18, height 5' 6 (1.676 m), weight 293 lb 6.4 oz (133.1 kg), SpO2 99%.    HEENT: No thrush or ulcers. Resp: Scattered expiratory wheezes.  No respiratory distress Cardio: Regular rate and rhythm. GI: Diffuse mild tenderness.  No mass.  No apparent ascites.  No hepatosplenomegaly. Vascular: No leg edema. Neuro: Vibratory sense intact over the fingertips per tuning fork exam. Skin: No rash. Port-A-Cath without erythema.  Lab Results:  Lab Results  Component Value Date   WBC 3.8 (L) 03/14/2024   HGB 10.3 (L) 03/14/2024   HCT 31.2 (L) 03/14/2024   MCV 89.9 03/14/2024   PLT 126 (L) 03/14/2024   NEUTROABS 2.2 03/14/2024    Imaging:  No results found.  Medications: I have reviewed the patient's current medications.  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) 12/27/2023: CT abdomen/pelvis-extensive abnormal diffuse stranding and nodularity of the omentum and mesentery, small volume ascites, small periumbilical hernia with soft tissue nodularity, enlarged aortocaval retroperitoneal node 01/05/2024: PET-hypermetabolic lymph nodes in the mediastinum, bilateral internal mammary, and right pericardial areas, diffuse carcinomatosis,  small hypermetabolic retroperitoneal nodes, no primary lesion identified 01/18/2024: CT-guided biopsy of left peritoneal tumor-metastatic high-grade serous carcinoma, PAX8 and 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 Keniston appears stable.  She has completed 2 cycles of paclitaxel /carboplatin .  Aside from arthralgias she seems to be tolerating chemotherapy well.  The pain began after chemotherapy, before G-CSF support administered.  We reviewed the CBC and chemistry panel from today.  Counts are adequate for treatment.  She has mild thrombocytopenia.  Chemotherapy doses will be reduced due to mild thrombocytopenia and arthralgias.  She will complete cycle 3 03/15/2024.  Restaging CTs in the next 10 to 14 days.  We will contact Dr. Lyda office to arrange for follow-up there.  Ms. Brumbaugh agrees with this plan.  She will return for follow-up as scheduled in 3 weeks.  We are available to see her sooner if needed.  Patient seen with Dr. Cloretta.  Olam Ned ANP/GNP-BC   03/14/2024  9:33 AM  This was a shared visit with Olam Ned.  Ms. Shamoon has completed 2 cycles of chemotherapy.  She reports significant bone pain following each cycle of chemotherapy.  The bone pain began prior to receiving G-CSF.  The pain is likely secondary to paclitaxel .  The platelet count is mildly low today.  Paclitaxel  and carboplatin  will be dose reduced beginning with cycle 3 chemotherapy 03/15/2024.  She will undergo a  restaging evaluation after cycle 3 chemotherapy.  I was present for  greater than 50% of today's visit.  I performed medical decision making.  Arvella Hof, MD

## 2024-03-15 ENCOUNTER — Other Ambulatory Visit

## 2024-03-15 ENCOUNTER — Encounter: Payer: Self-pay | Admitting: Oncology

## 2024-03-15 ENCOUNTER — Inpatient Hospital Stay

## 2024-03-15 ENCOUNTER — Inpatient Hospital Stay: Attending: Physician Assistant

## 2024-03-15 ENCOUNTER — Telehealth: Payer: Self-pay | Admitting: Oncology

## 2024-03-15 ENCOUNTER — Inpatient Hospital Stay: Admitting: Nurse Practitioner

## 2024-03-15 ENCOUNTER — Telehealth: Payer: Self-pay

## 2024-03-15 VITALS — BP 127/54 | HR 67 | Temp 98.7°F | Resp 18

## 2024-03-15 DIAGNOSIS — Z5111 Encounter for antineoplastic chemotherapy: Secondary | ICD-10-CM | POA: Diagnosis not present

## 2024-03-15 DIAGNOSIS — C579 Malignant neoplasm of female genital organ, unspecified: Secondary | ICD-10-CM

## 2024-03-15 LAB — CA 125: Cancer Antigen (CA) 125: 441 U/mL — ABNORMAL HIGH (ref 0.0–38.1)

## 2024-03-15 MED ORDER — PALONOSETRON HCL INJECTION 0.25 MG/5ML
0.2500 mg | Freq: Once | INTRAVENOUS | Status: AC
  Administered 2024-03-15: 0.25 mg via INTRAVENOUS
  Filled 2024-03-15: qty 5

## 2024-03-15 MED ORDER — SODIUM CHLORIDE 0.9% FLUSH
10.0000 mL | INTRAVENOUS | Status: DC | PRN
Start: 2024-03-15 — End: 2024-03-15

## 2024-03-15 MED ORDER — SODIUM CHLORIDE 0.9 % IV SOLN: INTRAVENOUS | Status: DC

## 2024-03-15 MED ORDER — SODIUM CHLORIDE 0.9 % IV SOLN
521.2000 mg | Freq: Once | INTRAVENOUS | Status: AC
  Administered 2024-03-15: 520 mg via INTRAVENOUS
  Filled 2024-03-15: qty 52

## 2024-03-15 MED ORDER — FAMOTIDINE IN NACL 20-0.9 MG/50ML-% IV SOLN
20.0000 mg | Freq: Once | INTRAVENOUS | Status: AC
  Administered 2024-03-15: 20 mg via INTRAVENOUS
  Filled 2024-03-15: qty 50

## 2024-03-15 MED ORDER — CETIRIZINE HCL 10 MG/ML IV SOLN
10.0000 mg | Freq: Once | INTRAVENOUS | Status: AC
  Administered 2024-03-15: 10 mg via INTRAVENOUS
  Filled 2024-03-15: qty 1

## 2024-03-15 MED ORDER — SODIUM CHLORIDE 0.9 % IV SOLN
140.0000 mg/m2 | Freq: Once | INTRAVENOUS | Status: AC
  Administered 2024-03-15: 348 mg via INTRAVENOUS
  Filled 2024-03-15: qty 58

## 2024-03-15 MED ORDER — DEXAMETHASONE SODIUM PHOSPHATE 10 MG/ML IJ SOLN
10.0000 mg | Freq: Once | INTRAMUSCULAR | Status: AC
  Administered 2024-03-15: 10 mg via INTRAVENOUS
  Filled 2024-03-15: qty 1

## 2024-03-15 MED ORDER — SODIUM CHLORIDE 0.9 % IV SOLN
150.0000 mg | Freq: Once | INTRAVENOUS | Status: AC
  Administered 2024-03-15: 150 mg via INTRAVENOUS
  Filled 2024-03-15: qty 150

## 2024-03-15 NOTE — Patient Instructions (Signed)
 [facility] - A DEPT OF [facility]  Discharge Instructions: Thank you for choosing [facility] to provide your oncology and hematology care.   If you have a lab appointment with the Cancer Center, please go directly to the Cancer Center and check in at the registration area.   Wear comfortable clothing and clothing appropriate for easy access to any Portacath or PICC line.   We strive to give you quality time with your provider. You may need to reschedule your appointment if you arrive late (15 or more minutes).  Arriving late affects you and other patients whose appointments are after yours.  Also, if you miss three or more appointments without notifying the office, you may be dismissed from the clinic at the provider's discretion.      For prescription refill requests, have your pharmacy contact our office and allow 72 hours for refills to be completed.    Today you received the following chemotherapy and/or immunotherapy agents: taxol  and carboplatin .      To help prevent nausea and vomiting after your treatment, we encourage you to take your nausea medication as directed.  BELOW ARE SYMPTOMS THAT SHOULD BE REPORTED IMMEDIATELY: *FEVER GREATER THAN 100.4 F (38 C) OR HIGHER *CHILLS OR SWEATING *NAUSEA AND VOMITING THAT IS NOT CONTROLLED WITH YOUR NAUSEA MEDICATION *UNUSUAL SHORTNESS OF BREATH *UNUSUAL BRUISING OR BLEEDING *URINARY PROBLEMS (pain or burning when urinating, or frequent urination) *BOWEL PROBLEMS (unusual diarrhea, constipation, pain near the anus) TENDERNESS IN MOUTH AND THROAT WITH OR WITHOUT PRESENCE OF ULCERS (sore throat, sores in mouth, or a toothache) UNUSUAL RASH, SWELLING OR PAIN  UNUSUAL VAGINAL DISCHARGE OR ITCHING   Items with * indicate a potential emergency and should be followed up as soon as possible or go to the Emergency Department if any problems should occur.  Please show the CHEMOTHERAPY ALERT CARD or  IMMUNOTHERAPY ALERT CARD at check-in to the Emergency Department and triage nurse.  Should you have questions after your visit or need to cancel or reschedule your appointment, please contact [facility] - A DEPT OF [facility]  Dept: [phone]  and follow the prompts.  Office hours are 8:00 a.m. to 4:30 p.m. Monday - Friday. Please note that voicemails left after 4:00 p.m. may not be returned until the following business day.  We are closed weekends and major holidays. You have access to a nurse at all times for urgent questions. Please call the main number to the clinic Dept: [phone] and follow the prompts.   For any non-urgent questions, you may also contact your provider using MyChart. We now offer e-Visits for anyone 56 and older to request care online for non-urgent symptoms. For details visit [email or web address].   Also download the MyChart app! Go to the app store, search MyChart, open the app, select [facility], and log in with your MyChart username and password.

## 2024-03-15 NOTE — Telephone Encounter (Signed)
 Left a message with appointment to see Dr. Eldonna on 04/03/24 at 3:00.  Requested a return call to confirm.

## 2024-03-15 NOTE — Telephone Encounter (Signed)
-----   Message from Olam Ned sent at 03/15/2024 11:26 AM EDT ----- Please let her know the CA125 is lower.  Follow-up as scheduled.

## 2024-03-15 NOTE — Telephone Encounter (Signed)
 Patient gave verbal understanding, no further questions at this time.

## 2024-03-17 ENCOUNTER — Encounter: Payer: Self-pay | Admitting: Oncology

## 2024-03-17 ENCOUNTER — Inpatient Hospital Stay

## 2024-03-17 VITALS — BP 134/78 | HR 81 | Temp 97.5°F | Resp 18

## 2024-03-17 DIAGNOSIS — C579 Malignant neoplasm of female genital organ, unspecified: Secondary | ICD-10-CM

## 2024-03-17 DIAGNOSIS — Z5111 Encounter for antineoplastic chemotherapy: Secondary | ICD-10-CM | POA: Diagnosis not present

## 2024-03-17 MED ORDER — PEGFILGRASTIM-CBQV 6 MG/0.6ML ~~LOC~~ SOSY
6.0000 mg | PREFILLED_SYRINGE | Freq: Once | SUBCUTANEOUS | Status: AC
  Administered 2024-03-17: 6 mg via SUBCUTANEOUS
  Filled 2024-03-17: qty 0.6

## 2024-03-17 NOTE — Patient Instructions (Signed)

## 2024-03-17 NOTE — Telephone Encounter (Signed)
 Susan Frederick called back and confirmed her appointment with Dr. Eldonna on 04/03/24 at 3:15.  Also reviewed plan for one more cycle of chemo and surgery on 05/02/24 depending on the CT results from 03/28/24.  She verbalized understanding and agreement.

## 2024-03-21 ENCOUNTER — Encounter: Payer: Self-pay | Admitting: Oncology

## 2024-03-23 ENCOUNTER — Ambulatory Visit: Admitting: Podiatry

## 2024-03-23 DIAGNOSIS — L84 Corns and callosities: Secondary | ICD-10-CM

## 2024-03-23 DIAGNOSIS — M216X2 Other acquired deformities of left foot: Secondary | ICD-10-CM | POA: Diagnosis not present

## 2024-03-23 NOTE — Progress Notes (Signed)
 Subjective:   Patient ID: Susan Frederick, female   DOB: 73 y.o.   MRN: [id number]   HPI Chief Complaint  Patient presents with   Callouses    Pt is here due to callous on the left foot, she also would like her toenails trimmed and to check for neuropathy.   73 year old female presents the office today with above concerns.  This started 5 to 6 weeks ago and it initially started like a stone bruise and is progressively getting worse.  No injuries that she reports no recent treatment.  She states that she does not go barefoot and she does not recall any injuries or stepping on foreign objects.  Review of Systems  All other systems reviewed and are negative.  Past Medical History:  Diagnosis Date   Arthritis    Cancer (HCC)    GYY,METS WITH OMENTUM   DDD (degenerative disc disease), lumbar    Diverticulosis of colon    Hematuria    Hypertension    Mixed stress and urge urinary incontinence    Obesity    OSA on CPAP     Past Surgical History:  Procedure Laterality Date   ANKLE FUSION WITH GASTROC SLIDE Left 2010   COLONOSCOPY     HAND TENDON SURGERY     right   IR IMAGING GUIDED PORT INSERTION  01/18/2024   KNEE ARTHROSCOPY     left   NOSE SURGERY     deviated septum   POLYPECTOMY     ROOT CANAL  2025   TOE SURGERY     right foot/ little toe   WISDOM TOOTH EXTRACTION  08/11/1967     Current Outpatient Medications:    amoxicillin (AMOXIL) 500 MG capsule, Take 2 capsules (1,000 mg total) by mouth to start THEN 1 capsule (500 mg total) 3 (three) times daily as directed., Disp: 21 capsule, Rfl: 1   amoxicillin (AMOXIL) 500 MG capsule, Take 2 capsules (1,000 mg total) by mouth now THEN 1 capsule (500 mg total) 3 (three) times daily until gone, Disp: 30 capsule, Rfl: 1   amoxicillin-clavulanate (AUGMENTIN) 875-125 MG tablet, Take 1 tablet by mouth 2 (two) times daily., Disp: 20 tablet, Rfl: 1   Boswellia-Glucosamine-Vit D (OSTEO BI-FLEX ONE PER DAY PO), Take by mouth. (Patient  taking differently: Take 1 tablet by mouth daily as needed.), Disp: , Rfl:    cephALEXin (KEFLEX) 500 MG capsule, Take 1 capsule (500 mg total) by mouth 3 (three) times daily., Disp: 20 capsule, Rfl: 0   levocetirizine (XYZAL) 5 MG tablet, Take 1 tablet (5 mg total) by mouth every evening., Disp: 90 tablet, Rfl: 1   lidocaine-prilocaine (EMLA) cream, Apply 1 Application topically as needed (apply to Port 1-2 hours prior to appointment)., Disp: 30 g, Rfl: 2   lisinopril (PRINIVIL,ZESTRIL) 20 MG tablet, Take 20 mg by mouth daily.  , Disp: , Rfl:    lisinopril (ZESTRIL) 20 MG tablet, Take 1 tablet (20 mg total) by mouth daily., Disp: 90 tablet, Rfl: 3   LORazepam (ATIVAN) 0.5 MG tablet, Take 1 tablet (0.5 mg total) by mouth at bedtime as needed for sleep., Disp: 30 tablet, Rfl: 0   meloxicam (MOBIC) 15 MG tablet, Take 1 tablet (15 mg total) by mouth daily as needed for knee pain, Disp: 30 tablet, Rfl: 2   methocarbamol (ROBAXIN) 500 MG tablet, Take 500 mg by mouth 4 (four) times daily., Disp: , Rfl:    Multiple Vitamins-Minerals (MULTIVITAMIN WITH MINERALS) tablet, Take 1  tablet by mouth daily.  Centrum Womens Silver, Disp: , Rfl:    Multiple Vitamins-Minerals (PRESERVISION AREDS 2) CAPS, Take 1 capsule by mouth every evening., Disp: , Rfl:    ondansetron (ZOFRAN) 8 MG tablet, Take 1 tablet (8 mg total) by mouth every 8 (eight) hours as needed (Starting 3 days after chemo as needed for nausea/vomiting)., Disp: 20 tablet, Rfl: 0   oxyCODONE (OXY IR/ROXICODONE) 5 MG immediate release tablet, Take 1-2 tablets (5-10 mg total) by mouth every 6 (six) hours as needed for severe pain (pain score 7-10)., Disp: 30 tablet, Rfl: 0   prochlorperazine (COMPAZINE) 10 MG tablet, Take 1 tablet (10 mg total) by mouth every 6 (six) hours as needed for nausea or vomiting. (Patient not taking: Reported on 03/14/2024), Disp: 30 tablet, Rfl: 0   Semaglutide-Weight Management 0.25 MG/0.5ML SOAJ, Inject 0.25 mg into the skin once a  week., Disp: 2 mL, Rfl: 0   senna (SENOKOT) 8.6 MG TABS tablet, Take 2 tablets (17.2 mg total) by mouth daily. (Patient not taking: Reported on 03/14/2024), Disp: 120 tablet, Rfl: 0   sertraline (ZOLOFT) 50 MG tablet, Take 50 mg by mouth daily.  , Disp: , Rfl:    sertraline (ZOLOFT) 50 MG tablet, Take 1 tablet (50 mg total) by mouth daily., Disp: 90 tablet, Rfl: 1  No Known Allergies        Objective:  Physical Exam  General: AAO x3, NAD  Dermatological: Irritated lesion submetatarsal 1 without any underlying ulceration, drainage or signs of infection.  There is 1 small area of dried blood present on the very central aspect but there does not appear to be any foreign object nails are mildly elongated, dystrophic.  Vascular: Dorsalis Pedis artery and Posterior Tibial artery pedal pulses are 2/4 bilateral with immedate capillary fill time. There is no pain with calf compression, swelling, warmth, erythema.   Neruologic: Grossly intact via light touch bilateral.   Musculoskeletal: Submetatarsal 1 prominence noted and tenderness palpation of the hyperkeratotic lesion.  No other areas of discomfort.  Gait: Unassisted, Nonantalgic.      Assessment:   73 year old female with hyperkeratotic lesion     Plan:  -Treatment options discussed including all alternatives, risks, and complications -Etiology of symptoms were discussed - discussed different etiologies of this but this is most likely consistent with a plantar callus. -Sharply debrided lesion without any complications or bleeding.  Discussed moisturizer, offloading recurrence which is likely. -As a courtesy debride the nails without any complications or bleeding.  Return if symptoms worsen or fail to improve.  Susan Frederick DPM

## 2024-03-28 ENCOUNTER — Ambulatory Visit (HOSPITAL_BASED_OUTPATIENT_CLINIC_OR_DEPARTMENT_OTHER)
Admission: RE | Admit: 2024-03-28 | Discharge: 2024-03-28 | Disposition: A | Discharge status: Home / Self Care | Source: Ambulatory Visit | Attending: Nurse Practitioner | Admitting: Nurse Practitioner

## 2024-03-28 ENCOUNTER — Other Ambulatory Visit: Payer: Self-pay | Admitting: *Deleted

## 2024-03-28 DIAGNOSIS — Z7969 Long term (current) use of other immunomodulators and immunosuppressants: Secondary | ICD-10-CM | POA: Diagnosis not present

## 2024-03-28 DIAGNOSIS — I2510 Atherosclerotic heart disease of native coronary artery without angina pectoris: Secondary | ICD-10-CM | POA: Diagnosis not present

## 2024-03-28 DIAGNOSIS — C786 Secondary malignant neoplasm of retroperitoneum and peritoneum: Secondary | ICD-10-CM | POA: Diagnosis not present

## 2024-03-28 DIAGNOSIS — C579 Malignant neoplasm of female genital organ, unspecified: Secondary | ICD-10-CM | POA: Diagnosis present

## 2024-03-28 DIAGNOSIS — I700 Atherosclerosis of aorta: Secondary | ICD-10-CM | POA: Insufficient documentation

## 2024-03-28 DIAGNOSIS — R911 Solitary pulmonary nodule: Secondary | ICD-10-CM | POA: Insufficient documentation

## 2024-03-28 DIAGNOSIS — R160 Hepatomegaly, not elsewhere classified: Secondary | ICD-10-CM | POA: Diagnosis not present

## 2024-03-28 MED ORDER — IOHEXOL 300 MG/ML  SOLN
100.0000 mL | Freq: Once | INTRAMUSCULAR | Status: AC | PRN
  Administered 2024-03-28: 100 mL via INTRAVENOUS

## 2024-03-30 ENCOUNTER — Encounter: Payer: Self-pay | Admitting: Psychiatry

## 2024-04-02 ENCOUNTER — Other Ambulatory Visit: Payer: Self-pay | Admitting: Oncology

## 2024-04-02 DIAGNOSIS — C579 Malignant neoplasm of female genital organ, unspecified: Secondary | ICD-10-CM

## 2024-04-02 NOTE — Progress Notes (Unsigned)
 Gynecologic Oncology Return Clinic Visit  Date of Service: 04/03/2024 Referring Provider: Johnston Police, PA-C  Medical Oncologist: Arley Hof, MD  Assessment & Plan: Susan Frederick is a 73 y.o. 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   Gyn malignancy: - Germline testing negative - Foundation: HRD neg, MSS, TMB low     >> Mutations - CCNE1, KRAS, PIK3CA, TP53, SF3B1 - CT c/a/p on 03/28/24 with significant improvement in disease - CA125 with downtrend from >2000 to 441 on 03/14/24 - Cycle 4 is scheduled for 04/05/24 - Discussed that based on her response to treatment I feel that she is an appropriate candidate for proceeding with interval debulking surgery. - We discussed that debulking procedures will include removal of uterus, tubes, ovaries, omentum and any other areas with evidence of disease. This could require additional procedures including bowel surgery and possible ostomy.   Patient was consented for: robotic assisted total laparoscopic hysterectomy, bilateral salpingo-oophorectomy, mini laparotomy for omentectomy for tumor debulking, possible laparotomy, possible bowel surgery, possible ostomy on 05/02/24.  The risks of surgery were discussed in detail and she understands these to including but not limited to bleeding requiring a blood transfusion, infection, injury to adjacent organs (including but not limited to the bowels, bladder, ureters, nerves, blood vessels), thromboembolic events, wound separation, hernia, vaginal cuff separation, possible risk of lymphedema and lymphocyst if lymphadenectomy performed, unforseen complication, possible need for re-exploration, medical complications such as heart attack, stroke, pneumonia, and possible permanent ostomy.  If the patient experiences any of these events, she understands that her hospitalization or  recovery may be prolonged and that she may need to take additional medications for a prolonged period. The patient will receive DVT and antibiotic prophylaxis as indicated. She voiced a clear understanding. She had the opportunity to ask questions and informed consent was obtained today. She wishes to proceed.  Plan for bowel prep and ostomy marking. Plan for same day discharge but reviewed possible need for admission pending the extent of debulking.  Plan for cardiology clearance as below.  All preoperative instructions were reviewed. Postoperative expectations were also reviewed. Written handouts were provided to the patient.  Cardiologist: - [facility] - saw 12/21/23 - Echo 01/04/24 - EF 55-60% - She was previously recommended to undergo a stress test if echo normal. Pt initially put off given diagnosis and starting chemo - Recommend that pt reschedule stress test given that we are proceeding with surgery. - Will request clearance pending completion of her stress test.   RTC postop.  Hoy Masters, MD Gynecologic Oncology   Medical Decision Making I personally spent  TOTAL 60 minutes face-to-face and non-face-to-face in the care of this patient, which includes all pre, intra, and post visit time on the date of service.   ----------------------- Reason for Visit: Treatment planning  Treatment History: Oncology History  Gynecologic cancer [facility])  01/27/2024 Initial Diagnosis   Gynecologic cancer (HCC)   01/27/2024 Cancer Staging   Staging form: Ovary, Fallopian Tube, and Primary Peritoneal Carcinoma, AJCC 8th Edition - Clinical: FIGO Stage IV (cTX, cNX, cM1) - Signed by Hof Arley NOVAK, MD on 01/27/2024 Sites of metastasis: Distant lymph nodes, NOS   02/02/2024 -  Chemotherapy   Patient is on Treatment Plan : OVARIAN Carboplatin  (AUC 6) + Paclitaxel  (175) q21d X 6 Cycles       Interval History: Patient reports that  she is overall doing well.  She is tolerating chemotherapy  well.  Her biggest issue with treatment is joint pain which she takes Claritin, Tylenol  as needed and oxycodone  as needed.  She reports that she only needs the oxycodone  for the first few days after chemotherapy.  Her constipation has improved significantly since treatment and currently only takes MiraLAX as needed.   Past Medical/Surgical History: Past Medical History:  Diagnosis Date   Arthritis    Cancer (HCC)    GYY,METS WITH OMENTUM   DDD (degenerative disc disease), lumbar    Diverticulosis of colon    Hematuria    Hypertension    Mixed stress and urge urinary incontinence    Obesity    OSA on CPAP     Past Surgical History:  Procedure Laterality Date   ANKLE FUSION WITH GASTROC SLIDE Left 2010   COLONOSCOPY     HAND TENDON SURGERY     right   IR IMAGING GUIDED PORT INSERTION  01/18/2024   KNEE ARTHROSCOPY     left   NOSE SURGERY     deviated septum   POLYPECTOMY     ROOT CANAL  2025   TOE SURGERY     right foot/ little toe   WISDOM TOOTH EXTRACTION  08/11/1967    Family History  Problem Relation Age of Onset   Endometrial cancer Sister 1       paternal half sister   Skin cancer Sister        basal/squamous   Lung cancer Maternal Aunt    Breast cancer Maternal Grandmother 58 - 66   Throat cancer Maternal Grandfather 40 - 48   Ovarian cancer Neg Hx    Colon cancer Neg Hx     Social History   Socioeconomic History   Marital status: Single    Spouse name: Not on file   Number of children: Not on file   Years of education: Not on file   Highest education level: Not on file  Occupational History   Not on file  Tobacco Use   Smoking status: Former    Current packs/day: 0.00    Average packs/day: 1 pack/day for 17.0 years (17.0 ttl pk-yrs)    Types: Cigarettes    Start date: 49    Quit date: 41    Years since quitting: 39.6   Smokeless tobacco: Never   Tobacco comments:    Smoked for 18 years, quit in 1986.   Vaping Use   Vaping status: Never  Used  Substance and Sexual Activity   Alcohol use: Not Currently    Comment: socially   Drug use: No   Sexual activity: Never  Other Topics Concern   Not on file  Social History Narrative   Not on file   Social Drivers of Health   Financial Resource Strain: Low Risk  (12/18/2023)   Received [facility]   Overall Financial Resource Strain (CARDIA)    Difficulty of Paying Living Expenses: Not hard at all  Food Insecurity: No Food Insecurity (01/27/2024)   Hunger Vital Sign    Worried About Running Out of Food in the Last Year: Never true    Ran Out of Food in the Last Year: Never true  Transportation Needs: No Transportation Needs (01/27/2024)   PRAPARE - Administrator, Civil Service (Medical): No    Lack of Transportation (Non-Medical): No  Physical Activity: Inactive (12/18/2023)   Received from [facility]   Exercise  Vital Sign    On average, how many days per week do you engage in moderate to strenuous exercise (like a brisk walk)?: 0 days    On average, how many minutes do you engage in exercise at this level?: 60 min  Stress: No Stress Concern Present (12/18/2023)   Received from [facility] - Occupational Stress Questionnaire    Feeling of Stress : Not at all  Social Connections: Moderately Integrated (12/18/2023)   Received from [facility]   Social Network    How would you rate your social network (family, work, friends)?: Adequate participation with social networks    Current Medications:  Current Outpatient Medications:    Acetaminophen  500 MG capsule, , Disp: , Rfl:    Boswellia-Glucosamine-Vit D (OSTEO BI-FLEX ONE PER DAY PO), Take by mouth., Disp: , Rfl:    lidocaine -prilocaine  (EMLA ) cream, Apply 1 Application topically as needed (apply to Port 1-2 hours prior to appointment)., Disp: 30 g, Rfl: 2   lisinopril  (ZESTRIL ) 20 MG tablet, Take 1 tablet (20 mg total) by mouth daily., Disp: 90 tablet,  Rfl: 3   loratadine (CLARITIN) 10 MG tablet, , Disp: , Rfl:    LORazepam  (ATIVAN ) 0.5 MG tablet, Take 1 tablet (0.5 mg total) by mouth at bedtime as needed for sleep., Disp: 30 tablet, Rfl: 0   meloxicam  (MOBIC ) 15 MG tablet, Take 1 tablet (15 mg total) by mouth daily as needed for knee pain, Disp: 30 tablet, Rfl: 2   methocarbamol (ROBAXIN) 500 MG tablet, Take 500 mg by mouth 4 (four) times daily., Disp: , Rfl:    Multiple Vitamins-Minerals (MULTIVITAMIN WITH MINERALS) tablet, Take 1 tablet by mouth daily.  Centrum Womens Silver, Disp: , Rfl:    Multiple Vitamins-Minerals (PRESERVISION AREDS 2) CAPS, Take 1 capsule by mouth every evening., Disp: , Rfl:    oxyCODONE  (OXY IR/ROXICODONE ) 5 MG immediate release tablet, Take 1-2 tablets (5-10 mg total) by mouth every 6 (six) hours as needed for severe pain (pain score 7-10)., Disp: 30 tablet, Rfl: 0   senna (SENOKOT) 8.6 MG TABS tablet, Take 2 tablets (17.2 mg total) by mouth daily., Disp: 120 tablet, Rfl: 0   sertraline  (ZOLOFT ) 50 MG tablet, Take 1 tablet (50 mg total) by mouth daily., Disp: 90 tablet, Rfl: 1  Review of Symptoms: Complete 10-system review is positive for: Appetite changes, shortness of breath, some constipation, joint pain, wheezing, abdominal distention, diarrhea, fatigue, abdominal pain, weight loss, cough  Physical Exam: BP (!) 158/63 (BP Location: Left Arm, Patient Position: Sitting)   Pulse 89   Temp 98.2 F (36.8 C) (Oral)   Resp 20   Wt 286 lb 6.4 oz (129.9 kg)   SpO2 98%   BMI 46.23 kg/m  General: Alert, oriented, no acute distress. HEENT: Normocephalic, atraumatic. Neck symmetric without masses. Sclera anicteric.  Chest: Normal work of breathing. Clear to auscultation bilaterally.   Cardiovascular: Regular rate and rhythm, no murmurs. Abdomen: Soft, nontender.  Normoactive bowel sounds.  No masses appreciated.   Extremities: Grossly normal range of motion.  Warm, well perfused.  No edema bilaterally. Skin: No  rashes or lesions noted. Lymphatics: No cervical, supraclavicular, or inguinal adenopathy. GU: Normal appearing external genitalia without erythema, excoriation, or lesions.  Speculum exam reveals normal cervix and vagina.  Bimanual exam reveals normal cervix, mobile uterus, no obvious adnexal mass. Exam limited by body habitus.  Rectovaginal exam negative. Exam chaperoned by Kimberly [place], CMA   Laboratory &  Radiologic Studies: Lab Results  Component Value Date   CAN125 441.0 (H) 03/14/2024   CAN125 2,045.0 (H) 01/28/2024   CAN125 2,165.0 (H) 01/11/2024   CEA <1.00 01/11/2024   CT CHEST ABDOMEN PELVIS W CONTRAST 03/28/2024  Narrative CLINICAL DATA:  Metastatic gynecologic malignancy undergoing chemotherapy * Tracking Code: BO *  EXAM: CT CHEST, ABDOMEN, AND PELVIS WITH CONTRAST  TECHNIQUE: Multidetector CT imaging of the chest, abdomen and pelvis was performed following the standard protocol during bolus administration of intravenous contrast.  RADIATION DOSE REDUCTION: This exam was performed according to the departmental dose-optimization program which includes automated exposure control, adjustment of the mA and/or kV according to patient size and/or use of iterative reconstruction technique.  CONTRAST:  OMNIPAQUE  IOHEXOL  300 MG/ML  SOLN  COMPARISON:  Outside PET-CT, 01/05/2024  FINDINGS: CT CHEST FINDINGS  Cardiovascular: Right chest port catheter. Aortic atherosclerosis. Normal heart size. Three-vessel coronary artery calcifications. No pericardial effusion.  Mediastinum/Nodes: No enlarged mediastinal, hilar, or axillary lymph nodes. Small hiatal hernia. Thyroid gland, trachea, and esophagus demonstrate no significant findings.  Lungs/Pleura: Tiny nodule of the peripheral left lower lobe measuring 0.3 cm (series 3, image 103). Diffuse bilateral bronchial wall thickening. No pleural effusion or pneumothorax.  Musculoskeletal: No chest wall abnormality.  No acute osseous findings.  CT ABDOMEN PELVIS FINDINGS  Hepatobiliary: Low-attenuation lesion of the anterior liver measuring 1.1 x 0.7 cm (series 2, image 57). No gallstones, gallbladder wall thickening, or biliary dilatation.  Pancreas: Unremarkable. No pancreatic ductal dilatation or surrounding inflammatory changes.  Spleen: Normal in size without significant abnormality.  Adrenals/Urinary Tract: Adrenal glands are unremarkable. Kidneys are normal, without renal calculi, solid lesion, or hydronephrosis. Bladder is unremarkable.  Stomach/Bowel: Stomach is within normal limits. Appendix appears normal. No evidence of bowel wall thickening, distention, or inflammatory changes. Sigmoid diverticula.  Vascular/Lymphatic: Scattered aortic atherosclerosis. No enlarged abdominal or pelvic lymph nodes.  Reproductive: Subtle hypodensity of the uterine fundus (series 2, image 108).  Other: No abdominal wall hernia or abnormality. Mild, diffuse peritoneal thickening with stranding and nodularity of the omentum (series 2, image 70). No ascites.  Musculoskeletal: No acute osseous findings.  IMPRESSION: 1. Mild, diffuse peritoneal thickening with stranding and nodularity of the omentum, consistent with peritoneal carcinomatosis, significantly improved compared to prior PET-CT. Previously seen ascites resolved. 2. Low-attenuation lesion of the anterior liver measuring 1.1 x 0.7 cm, incompletely characterized, not significantly changed, possibly an incidentally present cyst or hemangioma although liver metastasis not excluded. This was not PET avid on prior examination. 3. Tiny nodule of the peripheral left lower lobe measuring 0.3 cm, nonspecific although most likely benign and incidental. This was not confidently visualized on free breathing PET-CT dated 01/05/2024. Attention on follow-up. 4. Diffuse bilateral bronchial wall thickening, consistent with nonspecific infectious or  inflammatory bronchitis. 5. Coronary artery disease.  Aortic Atherosclerosis (ICD10-I70.0).   Electronically Signed By: Marolyn JONETTA Jaksch M.D. On: 03/31/2024 17:42

## 2024-04-02 NOTE — H&P (View-Only) (Signed)
 Gynecologic Oncology Return Clinic Visit  Date of Service: 04/03/2024 Referring Provider: Johnston Police, PA-C  Medical Oncologist: Arley Hof, MD  Assessment & Plan: Susan Frederick is a 73 y.o. 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   Gyn malignancy: - Germline testing negative - Foundation: HRD neg, MSS, TMB low     >> Mutations - CCNE1, KRAS, PIK3CA, TP53, SF3B1 - CT c/a/p on 03/28/24 with significant improvement in disease - CA125 with downtrend from >2000 to 441 on 03/14/24 - Cycle 4 is scheduled for 04/05/24 - Discussed that based on her response to treatment I feel that she is an appropriate candidate for proceeding with interval debulking surgery. - We discussed that debulking procedures will include removal of uterus, tubes, ovaries, omentum and any other areas with evidence of disease. This could require additional procedures including bowel surgery and possible ostomy.   Patient was consented for: robotic assisted total laparoscopic hysterectomy, bilateral salpingo-oophorectomy, mini laparotomy for omentectomy for tumor debulking, possible laparotomy, possible bowel surgery, possible ostomy on 05/02/24.  The risks of surgery were discussed in detail and she understands these to including but not limited to bleeding requiring a blood transfusion, infection, injury to adjacent organs (including but not limited to the bowels, bladder, ureters, nerves, blood vessels), thromboembolic events, wound separation, hernia, vaginal cuff separation, possible risk of lymphedema and lymphocyst if lymphadenectomy performed, unforseen complication, possible need for re-exploration, medical complications such as heart attack, stroke, pneumonia, and possible permanent ostomy.  If the patient experiences any of these events, she understands that her hospitalization or  recovery may be prolonged and that she may need to take additional medications for a prolonged period. The patient will receive DVT and antibiotic prophylaxis as indicated. She voiced a clear understanding. She had the opportunity to ask questions and informed consent was obtained today. She wishes to proceed.  Plan for bowel prep and ostomy marking. Plan for same day discharge but reviewed possible need for admission pending the extent of debulking.  Plan for cardiology clearance as below.  All preoperative instructions were reviewed. Postoperative expectations were also reviewed. Written handouts were provided to the patient.  Cardiologist: - [facility] - saw 12/21/23 - Echo 01/04/24 - EF 55-60% - She was previously recommended to undergo a stress test if echo normal. Pt initially put off given diagnosis and starting chemo - Recommend that pt reschedule stress test given that we are proceeding with surgery. - Will request clearance pending completion of her stress test.   RTC postop.  Hoy Masters, MD Gynecologic Oncology   Medical Decision Making I personally spent  TOTAL 60 minutes face-to-face and non-face-to-face in the care of this patient, which includes all pre, intra, and post visit time on the date of service.   ----------------------- Reason for Visit: Treatment planning  Treatment History: Oncology History  Gynecologic cancer [facility])  01/27/2024 Initial Diagnosis   Gynecologic cancer (HCC)   01/27/2024 Cancer Staging   Staging form: Ovary, Fallopian Tube, and Primary Peritoneal Carcinoma, AJCC 8th Edition - Clinical: FIGO Stage IV (cTX, cNX, cM1) - Signed by Hof Arley NOVAK, MD on 01/27/2024 Sites of metastasis: Distant lymph nodes, NOS   02/02/2024 -  Chemotherapy   Patient is on Treatment Plan : OVARIAN Carboplatin  (AUC 6) + Paclitaxel  (175) q21d X 6 Cycles       Interval History: Patient reports that  she is overall doing well.  She is tolerating chemotherapy  well.  Her biggest issue with treatment is joint pain which she takes Claritin, Tylenol  as needed and oxycodone  as needed.  She reports that she only needs the oxycodone  for the first few days after chemotherapy.  Her constipation has improved significantly since treatment and currently only takes MiraLAX as needed.   Past Medical/Surgical History: Past Medical History:  Diagnosis Date   Arthritis    Cancer (HCC)    GYY,METS WITH OMENTUM   DDD (degenerative disc disease), lumbar    Diverticulosis of colon    Hematuria    Hypertension    Mixed stress and urge urinary incontinence    Obesity    OSA on CPAP     Past Surgical History:  Procedure Laterality Date   ANKLE FUSION WITH GASTROC SLIDE Left 2010   COLONOSCOPY     HAND TENDON SURGERY     right   IR IMAGING GUIDED PORT INSERTION  01/18/2024   KNEE ARTHROSCOPY     left   NOSE SURGERY     deviated septum   POLYPECTOMY     ROOT CANAL  2025   TOE SURGERY     right foot/ little toe   WISDOM TOOTH EXTRACTION  08/11/1967    Family History  Problem Relation Age of Onset   Endometrial cancer Sister 1       paternal half sister   Skin cancer Sister        basal/squamous   Lung cancer Maternal Aunt    Breast cancer Maternal Grandmother 58 - 66   Throat cancer Maternal Grandfather 40 - 48   Ovarian cancer Neg Hx    Colon cancer Neg Hx     Social History   Socioeconomic History   Marital status: Single    Spouse name: Not on file   Number of children: Not on file   Years of education: Not on file   Highest education level: Not on file  Occupational History   Not on file  Tobacco Use   Smoking status: Former    Current packs/day: 0.00    Average packs/day: 1 pack/day for 17.0 years (17.0 ttl pk-yrs)    Types: Cigarettes    Start date: 49    Quit date: 41    Years since quitting: 39.6   Smokeless tobacco: Never   Tobacco comments:    Smoked for 18 years, quit in 1986.   Vaping Use   Vaping status: Never  Used  Substance and Sexual Activity   Alcohol use: Not Currently    Comment: socially   Drug use: No   Sexual activity: Never  Other Topics Concern   Not on file  Social History Narrative   Not on file   Social Drivers of Health   Financial Resource Strain: Low Risk  (12/18/2023)   Received [facility]   Overall Financial Resource Strain (CARDIA)    Difficulty of Paying Living Expenses: Not hard at all  Food Insecurity: No Food Insecurity (01/27/2024)   Hunger Vital Sign    Worried About Running Out of Food in the Last Year: Never true    Ran Out of Food in the Last Year: Never true  Transportation Needs: No Transportation Needs (01/27/2024)   PRAPARE - Administrator, Civil Service (Medical): No    Lack of Transportation (Non-Medical): No  Physical Activity: Inactive (12/18/2023)   Received from [facility]   Exercise  Vital Sign    On average, how many days per week do you engage in moderate to strenuous exercise (like a brisk walk)?: 0 days    On average, how many minutes do you engage in exercise at this level?: 60 min  Stress: No Stress Concern Present (12/18/2023)   Received from [facility] - Occupational Stress Questionnaire    Feeling of Stress : Not at all  Social Connections: Moderately Integrated (12/18/2023)   Received from [facility]   Social Network    How would you rate your social network (family, work, friends)?: Adequate participation with social networks    Current Medications:  Current Outpatient Medications:    Acetaminophen  500 MG capsule, , Disp: , Rfl:    Boswellia-Glucosamine-Vit D (OSTEO BI-FLEX ONE PER DAY PO), Take by mouth., Disp: , Rfl:    lidocaine -prilocaine  (EMLA ) cream, Apply 1 Application topically as needed (apply to Port 1-2 hours prior to appointment)., Disp: 30 g, Rfl: 2   lisinopril  (ZESTRIL ) 20 MG tablet, Take 1 tablet (20 mg total) by mouth daily., Disp: 90 tablet,  Rfl: 3   loratadine (CLARITIN) 10 MG tablet, , Disp: , Rfl:    LORazepam  (ATIVAN ) 0.5 MG tablet, Take 1 tablet (0.5 mg total) by mouth at bedtime as needed for sleep., Disp: 30 tablet, Rfl: 0   meloxicam  (MOBIC ) 15 MG tablet, Take 1 tablet (15 mg total) by mouth daily as needed for knee pain, Disp: 30 tablet, Rfl: 2   methocarbamol (ROBAXIN) 500 MG tablet, Take 500 mg by mouth 4 (four) times daily., Disp: , Rfl:    Multiple Vitamins-Minerals (MULTIVITAMIN WITH MINERALS) tablet, Take 1 tablet by mouth daily.  Centrum Womens Silver, Disp: , Rfl:    Multiple Vitamins-Minerals (PRESERVISION AREDS 2) CAPS, Take 1 capsule by mouth every evening., Disp: , Rfl:    oxyCODONE  (OXY IR/ROXICODONE ) 5 MG immediate release tablet, Take 1-2 tablets (5-10 mg total) by mouth every 6 (six) hours as needed for severe pain (pain score 7-10)., Disp: 30 tablet, Rfl: 0   senna (SENOKOT) 8.6 MG TABS tablet, Take 2 tablets (17.2 mg total) by mouth daily., Disp: 120 tablet, Rfl: 0   sertraline  (ZOLOFT ) 50 MG tablet, Take 1 tablet (50 mg total) by mouth daily., Disp: 90 tablet, Rfl: 1  Review of Symptoms: Complete 10-system review is positive for: Appetite changes, shortness of breath, some constipation, joint pain, wheezing, abdominal distention, diarrhea, fatigue, abdominal pain, weight loss, cough  Physical Exam: BP (!) 158/63 (BP Location: Left Arm, Patient Position: Sitting)   Pulse 89   Temp 98.2 F (36.8 C) (Oral)   Resp 20   Wt 286 lb 6.4 oz (129.9 kg)   SpO2 98%   BMI 46.23 kg/m  General: Alert, oriented, no acute distress. HEENT: Normocephalic, atraumatic. Neck symmetric without masses. Sclera anicteric.  Chest: Normal work of breathing. Clear to auscultation bilaterally.   Cardiovascular: Regular rate and rhythm, no murmurs. Abdomen: Soft, nontender.  Normoactive bowel sounds.  No masses appreciated.   Extremities: Grossly normal range of motion.  Warm, well perfused.  No edema bilaterally. Skin: No  rashes or lesions noted. Lymphatics: No cervical, supraclavicular, or inguinal adenopathy. GU: Normal appearing external genitalia without erythema, excoriation, or lesions.  Speculum exam reveals normal cervix and vagina.  Bimanual exam reveals normal cervix, mobile uterus, no obvious adnexal mass. Exam limited by body habitus.  Rectovaginal exam negative. Exam chaperoned by Kimberly [place], CMA   Laboratory &  Radiologic Studies: Lab Results  Component Value Date   CAN125 441.0 (H) 03/14/2024   CAN125 2,045.0 (H) 01/28/2024   CAN125 2,165.0 (H) 01/11/2024   CEA <1.00 01/11/2024   CT CHEST ABDOMEN PELVIS W CONTRAST 03/28/2024  Narrative CLINICAL DATA:  Metastatic gynecologic malignancy undergoing chemotherapy * Tracking Code: BO *  EXAM: CT CHEST, ABDOMEN, AND PELVIS WITH CONTRAST  TECHNIQUE: Multidetector CT imaging of the chest, abdomen and pelvis was performed following the standard protocol during bolus administration of intravenous contrast.  RADIATION DOSE REDUCTION: This exam was performed according to the departmental dose-optimization program which includes automated exposure control, adjustment of the mA and/or kV according to patient size and/or use of iterative reconstruction technique.  CONTRAST:  OMNIPAQUE  IOHEXOL  300 MG/ML  SOLN  COMPARISON:  Outside PET-CT, 01/05/2024  FINDINGS: CT CHEST FINDINGS  Cardiovascular: Right chest port catheter. Aortic atherosclerosis. Normal heart size. Three-vessel coronary artery calcifications. No pericardial effusion.  Mediastinum/Nodes: No enlarged mediastinal, hilar, or axillary lymph nodes. Small hiatal hernia. Thyroid gland, trachea, and esophagus demonstrate no significant findings.  Lungs/Pleura: Tiny nodule of the peripheral left lower lobe measuring 0.3 cm (series 3, image 103). Diffuse bilateral bronchial wall thickening. No pleural effusion or pneumothorax.  Musculoskeletal: No chest wall abnormality.  No acute osseous findings.  CT ABDOMEN PELVIS FINDINGS  Hepatobiliary: Low-attenuation lesion of the anterior liver measuring 1.1 x 0.7 cm (series 2, image 57). No gallstones, gallbladder wall thickening, or biliary dilatation.  Pancreas: Unremarkable. No pancreatic ductal dilatation or surrounding inflammatory changes.  Spleen: Normal in size without significant abnormality.  Adrenals/Urinary Tract: Adrenal glands are unremarkable. Kidneys are normal, without renal calculi, solid lesion, or hydronephrosis. Bladder is unremarkable.  Stomach/Bowel: Stomach is within normal limits. Appendix appears normal. No evidence of bowel wall thickening, distention, or inflammatory changes. Sigmoid diverticula.  Vascular/Lymphatic: Scattered aortic atherosclerosis. No enlarged abdominal or pelvic lymph nodes.  Reproductive: Subtle hypodensity of the uterine fundus (series 2, image 108).  Other: No abdominal wall hernia or abnormality. Mild, diffuse peritoneal thickening with stranding and nodularity of the omentum (series 2, image 70). No ascites.  Musculoskeletal: No acute osseous findings.  IMPRESSION: 1. Mild, diffuse peritoneal thickening with stranding and nodularity of the omentum, consistent with peritoneal carcinomatosis, significantly improved compared to prior PET-CT. Previously seen ascites resolved. 2. Low-attenuation lesion of the anterior liver measuring 1.1 x 0.7 cm, incompletely characterized, not significantly changed, possibly an incidentally present cyst or hemangioma although liver metastasis not excluded. This was not PET avid on prior examination. 3. Tiny nodule of the peripheral left lower lobe measuring 0.3 cm, nonspecific although most likely benign and incidental. This was not confidently visualized on free breathing PET-CT dated 01/05/2024. Attention on follow-up. 4. Diffuse bilateral bronchial wall thickening, consistent with nonspecific infectious or  inflammatory bronchitis. 5. Coronary artery disease.  Aortic Atherosclerosis (ICD10-I70.0).   Electronically Signed By: Marolyn JONETTA Jaksch M.D. On: 03/31/2024 17:42

## 2024-04-03 ENCOUNTER — Inpatient Hospital Stay: Admitting: Psychiatry

## 2024-04-03 ENCOUNTER — Inpatient Hospital Stay

## 2024-04-03 VITALS — BP 152/72 | HR 89 | Temp 98.2°F | Resp 20 | Wt 286.4 lb

## 2024-04-03 DIAGNOSIS — C569 Malignant neoplasm of unspecified ovary: Secondary | ICD-10-CM | POA: Diagnosis not present

## 2024-04-03 DIAGNOSIS — R079 Chest pain, unspecified: Secondary | ICD-10-CM

## 2024-04-03 DIAGNOSIS — C786 Secondary malignant neoplasm of retroperitoneum and peritoneum: Secondary | ICD-10-CM

## 2024-04-03 DIAGNOSIS — Z7189 Other specified counseling: Secondary | ICD-10-CM | POA: Diagnosis not present

## 2024-04-03 DIAGNOSIS — Z5111 Encounter for antineoplastic chemotherapy: Secondary | ICD-10-CM | POA: Diagnosis not present

## 2024-04-03 NOTE — Progress Notes (Unsigned)
 Patient here for a follow up with Dr. Eldonna and for a pre-operative appointment prior to her scheduled surgery on 05/02/2024. She is scheduled for a robotic assisted laparoscopic total hysterectomy, bilateral salpingo-oophorectomy, mini lap for omentectomy, debulking, possible laparotomy, possible bowel surgery including possible ostomy. The surgery was discussed in detail.  See after visit summary for additional details.    Discussed post-op pain management in detail including the aspects of the enhanced recovery pathway.  Advised her that a new prescription would be sent in and it is only to be used for after her upcoming surgery.  We discussed the use of tylenol  post-op and to monitor for a maximum of 4,000 mg in a 24 hour period.  Also discussed using sennakot after surgery and to hold if having loose stools.  Discussed bowel prep and regimen in detail.     Discussed the use of SCDs and measures to take at home to prevent DVT including frequent mobility.  Reportable signs and symptoms of DVT discussed. Post-operative instructions discussed and expectations for after surgery. Incisional care discussed as well including reportable signs and symptoms including erythema, drainage, wound separation.     30 minutes spent with the patient.  Verbalizing understanding of material discussed. No needs or concerns voiced at the end of the visit.   Advised patient to call for any needs.  Advised that her post-operative medications had been prescribed and could be picked up at any time.    This appointment is included in the global surgical bundle as pre-operative teaching and has no charge.

## 2024-04-03 NOTE — Patient Instructions (Signed)
 Preparing for your Surgery   Plan for surgery on May 02, 2024 with Dr. Hoy Masters at [facility]. You will be scheduled for robotic assisted laparoscopic total hysterectomy (removal of the uterus and cervix), bilateral salpingo-oophorectomy (removal of ovaries and fallopian tubes), mini lap for omentectomy, debulking, possible laparotomy, possible bowel surgery including possible ostomy.    We also have you meet with the ostomy nurse before surgery to have a marking placed on your abdomen in case an ostomy is performed at the time of surgery.   Pre-operative Testing -You will receive a phone call from presurgical testing at [facility] to arrange for a pre-operative appointment and lab work.   -Bring your insurance card, copy of an advanced directive if applicable, medication list   -At that visit, you will be asked to sign a consent for a possible blood transfusion in case a transfusion becomes necessary during surgery.  The need for a blood transfusion is rare but having consent is a necessary part of your care.      -You should not be taking blood thinners or aspirin at least ten days prior to surgery unless instructed by your surgeon.   -Do not take supplements such as fish oil (omega 3), red yeast rice, turmeric before your surgery. STOP TAKING AT LEAST 10 DAYS BEFORE SURGERY. You want to avoid medications with aspirin in them including headache powders such as BC or Goody's), Excedrin migraine.   Day Before Surgery at Home -You have a BOWEL PREP the day before surgery. You will be advised you can have clear liquids up until 3 hours before your surgery.     AVOID GAS PRODUCING BEVERAGES. Things to avoid include carbonated beverages (fizzy beverages, sodas)   If your bowels are filled with gas, your surgeon will have difficulty visualizing your pelvic organs which increases your surgical risks.   Your role in recovery Your role is to become active as soon as  directed by your doctor, while still giving yourself time to heal.  Rest when you feel tired. You will be asked to do the following in order to speed your recovery:   - Cough and breathe deeply. This helps to clear and expand your lungs and can prevent pneumonia after surgery.  - STAY ACTIVE WHEN YOU GET HOME. Do mild physical activity. Walking or moving your legs help your circulation and body functions return to normal. Do not try to get up or walk alone the first time after surgery.   -If you develop swelling on one leg or the other, pain in the back of your leg, redness/warmth in one of your legs, please call the office or go to the Emergency Room to have a doppler to rule out a blood clot. For shortness of breath, chest pain-seek care in the Emergency Room as soon as possible. - Actively manage your pain. Managing your pain lets you move in comfort. We will ask you to rate your pain on a scale of zero to 10. It is your responsibility to tell your doctor or nurse where and how much you hurt so your pain can be treated.   Special Considerations -If you are diabetic, you may be placed on insulin after surgery to have closer control over your blood sugars to promote healing and recovery.  This does not mean that you will be discharged on insulin.  If applicable, your oral antidiabetics will be resumed when you are tolerating a solid diet.   -Your final  pathology results from surgery should be available around one week after surgery and the results will be relayed to you when available.   -Dr. Olam Mill is the surgeon that assists your GYN Oncologist with surgery.  If you end up staying the night, the next day after your surgery you will either see Dr. Viktoria, Dr. Eldonna, or Dr. Olam Mill.   -FMLA forms can be faxed to [phone] and please allow 5-7 business days for completion.   Pain Management After Surgery -You will be prescribed your pain medication and bowel regimen  medications before surgery closer to the date so that you can have these available when you are discharged from the hospital. The pain medication is for use ONLY AFTER surgery and a new prescription will not be given.    -Make sure that you have Tylenol  IF YOU ARE ABLE TO TAKE THESE MEDICATION at home to use on a regular basis after surgery for pain control.    -Review the attached handout on narcotic use and their risks and side effects.    Bowel Regimen -You will be prescribed Sennakot-S to take nightly to prevent constipation especially if you are taking the narcotic pain medication intermittently.  It is important to prevent constipation and drink adequate amounts of liquids. You can stop taking this medication when you are not taking pain medication and you are back on your normal bowel routine. IF YOU HAVE BOWEL SURGERY, THIS MAY BE ADJUSTED.   Risks of Surgery Risks of surgery are low but include bleeding, infection, damage to surrounding structures, re-operation, blood clots, and very rarely death.     Blood Transfusion Information (For the consent to be signed before surgery)   We will be checking your blood type before surgery so in case of emergencies, we will know what type of blood you would need.                                             WHAT IS A BLOOD TRANSFUSION?   A transfusion is the replacement of blood or some of its parts. Blood is made up of multiple cells which provide different functions. Red blood cells carry oxygen and are used for blood loss replacement. White blood cells fight against infection. Platelets control bleeding. Plasma helps clot blood. Other blood products are available for specialized needs, such as hemophilia or other clotting disorders. BEFORE THE TRANSFUSION  Who gives blood for transfusions?  You may be able to donate blood to be used at a later date on yourself (autologous donation). Relatives can be asked to donate blood. This is generally  not any safer than if you have received blood from a stranger. The same precautions are taken to ensure safety when a relative's blood is donated. Healthy volunteers who are fully evaluated to make sure their blood is safe. This is blood bank blood. Transfusion therapy is the safest it has ever been in the practice of medicine. Before blood is taken from a donor, a complete history is taken to make sure that person has no history of diseases nor engages in risky social behavior (examples are intravenous drug use or sexual activity with multiple partners). The donor's travel history is screened to minimize risk of transmitting infections, such as malaria. The donated blood is tested for signs of infectious diseases, such as HIV and hepatitis. The blood is  then tested to be sure it is compatible with you in order to minimize the chance of a transfusion reaction. If you or a relative donates blood, this is often done in anticipation of surgery and is not appropriate for emergency situations. It takes many days to process the donated blood. RISKS AND COMPLICATIONS Although transfusion therapy is very safe and saves many lives, the main dangers of transfusion include:  Getting an infectious disease. Developing a transfusion reaction. This is an allergic reaction to something in the blood you were given. Every precaution is taken to prevent this. The decision to have a blood transfusion has been considered carefully by your caregiver before blood is given. Blood is not given unless the benefits outweigh the risks.   AFTER SURGERY INSTRUCTIONS   Return to work: 4-6 weeks if applicable   You may have a white honeycomb dressing over your larger incision if you have open surgery. This dressing can be removed 5 days after surgery and you do not need to reapply a new dressing. Once you remove the dressing, you will notice that you have the surgical glue (dermabond) on the incision and this will peel off on its own.  You can get this dressing wet in the shower the days after surgery prior to removal on the 5th day.    You will need to be on a blood thinner after surgery to prevent blood clots for 2 weeks versus 4 weeks based on the surgery being through small incisions or a large incision. This can be given in pill form or injections.    AVOID USE OF NSAIDS (IBUPROFEN, NAPROXEN) WHILE TAKING THE BLOOD THINNER.    Activity: 1. Be up and out of the bed during the day.  Take a nap if needed.  You may walk up steps but be careful and use the hand rail.  Stair climbing will tire you more than you think, you may need to stop part way and rest.    2. No lifting or straining for 6 weeks over 10 pounds. No pushing, pulling, straining for 6 weeks.   3. No driving for 4-89 days when the following criteria have been met: Do not drive if you are taking narcotic pain medicine and make sure that your reaction time has returned.    4. You can shower as soon as the next day after surgery. Shower daily.  Use your regular soap and water (not directly on the incision) and pat your incision(s) dry afterwards; don't rub.  No tub baths or submerging your body in water until cleared by your surgeon. If you have the soap that was given to you by pre-surgical testing that was used before surgery, you do not need to use it afterwards because this can irritate your incisions.    5. No sexual activity and nothing in the vagina for 12 weeks.   6. You may experience a small amount of clear drainage from your incisions, which is normal.  If the drainage persists, increases, or changes color please call the office.   7. Do not use creams, lotions, or ointments such as neosporin on your incisions after surgery until advised by your surgeon because they can cause removal of the dermabond glue on your incisions.     8. You may experience vaginal spotting after surgery or when the stitches at the top of the vagina begin to dissolve.  The  spotting is normal but if you experience heavy bleeding, call our office.  9. Take Tylenol  first for pain if you are able to take these medication and only use narcotic pain medication for severe pain not relieved by the Tylenol .  Monitor your Tylenol  intake to a max of 4,000 mg in a 24 hour period.    Diet: 1. Low sodium Heart Healthy Diet is recommended but you are cleared to resume your normal (before surgery) diet after your procedure.   2. It is safe to use a laxative, such as Miralax or Colace, if you have difficulty moving your bowels before surgery. You have been prescribed Sennakot-S to take if constipated at bedtime every evening after surgery to keep bowel movements regular and to prevent constipation.     Wound Care: 1. Keep clean and dry.  Shower daily.   Reasons to call the Doctor: Fever - Oral temperature greater than 100.4 degrees Fahrenheit Foul-smelling vaginal discharge Difficulty urinating Nausea and vomiting Increased pain at the site of the incision that is unrelieved with pain medicine. Difficulty breathing with or without chest pain New calf pain especially if only on one side Sudden, continuing increased vaginal bleeding with or without clots.   Contacts: For questions or concerns you should contact:   Dr. Hoy Masters at [phone]   Eleanor Epps, NP at [phone]   After Hours: call [phone] and have the GYN Oncologist paged/contacted (after 5 pm or on the weekends). You will speak with an after hours RN and let he or she know you have had surgery.   Messages sent via mychart are for non-urgent matters and are not responded to after hours so for urgent needs, please call the after hours number.   GYN Oncology Bowel Preparation for surgery   There are two important steps to take to prepare for your surgery:   1. Cleansing of your colon: all the stool is washed out of your colon.   2. Take antibiotics: taken by mouth to help prevent  infection.   INSTRUCTIONS:   As Soon As Possible (at least 2-3 days BEFORE your surgery)   Please buy the following (5) items from a pharmacy:   The first item is an antibiotic. The first four items will be sent in as prescriptions and you can get the Gatorade or powerade at the drug store or grocery store.    1. Flagyl pills - 1 gram, 3 doses (antibiotic)   The next 2 items are laxatives and work to cleanse your colon.   3. 2 Dulcolax 5 mg tablets   4. 238 grams of Miralax (8.3 ounce bottle)   5. 64 oz of Gatorade or Powerade (not red)   (NOTE: If you are allergic to any of these medications, the prep will NOT be prescribed for your. Please let your physician know of any allergies.)   The Day Before Your Surgery   1. Do NOT eat any solid food. Do NOT drink unfiltered juices such as apple cider. Drink only clear liquids such as juice, black coffee, tea, sports drinks, soda pop, Jell-O, water. Please refer to handout from the pre-care suite for more details).   2. Starting at 9:00 a.m.: Take 2 Dulcolax tablets with 2 glasses of clear liquid.   3. 11:00 a.m.: Mix whole bottle of Miralax in 64 oz of Gatorade and drink one 8 oz glass every 15 minutes until gone.   4. When you have finished the Miralax, drink at least 4 glasses of clear liquids of your choice. You will start experiencing diarrhea anywhere between 30  minutes to 3 hours after completing the Miralax. Keep drinking plenty of clear liquids throughout the day. This will keep you from getting dehydrated from the diarrhea.   5. Take your antibiotics by mouth at these times after you complete the Miralax:   - At 2 p.m.: Take Flagyl (1 gram, total of two tablets)   - At 3 p.m.: Take Flagyl 2 tablets (1000 mg total or 1 gm)   - At 10 p.m.: Take Flagyl 2 tablets (1000 mg total or 1 gm)   The Day of Your Surgery   On the morning of your surgery, only take the medicines that the pre-care suite told you were okay to take. Take  them only with a sip of water.   Special Instructions:   - Please be sure you make your surgeon aware if you have diabetes. Your diabetic medications may need to be adjusted.   - If you feel dizzy or have severe nausea, vomiting or abdominal pain, or if you cannot finish drinking the bowel prep, please call the Gynecologic Oncology clinic at [phone] or your surgeon.   - If you have any life-threatening symptoms including wheezing, chest tightness, fever, swelling of your face, lips, tongue or throat, call 9-1-1- right away.   Questions?   Please call if you have questions or concerns:   - Weekdays 8:00 a.m. to 5:00 pm: Call the office at [phone]   - After hours and on weekends and holidays, call the paging operator at [phone] and ask for the GYN ONC on call to be paged.

## 2024-04-04 ENCOUNTER — Telehealth: Payer: Self-pay | Admitting: *Deleted

## 2024-04-04 ENCOUNTER — Encounter: Payer: Self-pay | Admitting: Psychiatry

## 2024-04-04 ENCOUNTER — Encounter: Payer: Self-pay | Admitting: Oncology

## 2024-04-04 NOTE — Telephone Encounter (Signed)
 Poke with the patient and gave the post op appt for 10/13 at 1 pm. Patient aware

## 2024-04-04 NOTE — Telephone Encounter (Addendum)
 Per Dr Eldonna fax records and surgical optimization form to the patient's PCP and cardiology office [facility] [place], [place] [phone] PCP) (Dr Isabell [phone] Cardio)

## 2024-04-05 ENCOUNTER — Inpatient Hospital Stay

## 2024-04-05 ENCOUNTER — Telehealth: Payer: Self-pay | Admitting: *Deleted

## 2024-04-05 ENCOUNTER — Telehealth: Payer: Self-pay | Admitting: Oncology

## 2024-04-05 ENCOUNTER — Inpatient Hospital Stay: Admitting: Oncology

## 2024-04-05 ENCOUNTER — Other Ambulatory Visit: Payer: Self-pay

## 2024-04-05 VITALS — BP 134/75 | HR 95 | Temp 97.8°F | Resp 18 | Ht 66.0 in | Wt 287.3 lb

## 2024-04-05 DIAGNOSIS — Z5111 Encounter for antineoplastic chemotherapy: Secondary | ICD-10-CM | POA: Diagnosis not present

## 2024-04-05 DIAGNOSIS — D696 Thrombocytopenia, unspecified: Secondary | ICD-10-CM

## 2024-04-05 DIAGNOSIS — C579 Malignant neoplasm of female genital organ, unspecified: Secondary | ICD-10-CM

## 2024-04-05 DIAGNOSIS — Z803 Family history of malignant neoplasm of breast: Secondary | ICD-10-CM

## 2024-04-05 DIAGNOSIS — K7689 Other specified diseases of liver: Secondary | ICD-10-CM

## 2024-04-05 DIAGNOSIS — C801 Malignant (primary) neoplasm, unspecified: Secondary | ICD-10-CM | POA: Diagnosis not present

## 2024-04-05 DIAGNOSIS — G893 Neoplasm related pain (acute) (chronic): Secondary | ICD-10-CM

## 2024-04-05 DIAGNOSIS — Z8049 Family history of malignant neoplasm of other genital organs: Secondary | ICD-10-CM

## 2024-04-05 DIAGNOSIS — Z801 Family history of malignant neoplasm of trachea, bronchus and lung: Secondary | ICD-10-CM

## 2024-04-05 DIAGNOSIS — C786 Secondary malignant neoplasm of retroperitoneum and peritoneum: Secondary | ICD-10-CM | POA: Diagnosis not present

## 2024-04-05 DIAGNOSIS — Z808 Family history of malignant neoplasm of other organs or systems: Secondary | ICD-10-CM

## 2024-04-05 DIAGNOSIS — R634 Abnormal weight loss: Secondary | ICD-10-CM

## 2024-04-05 LAB — CMP (CANCER CENTER ONLY)
ALT: 15 U/L (ref 0–44)
AST: 17 U/L (ref 15–41)
Albumin: 4.2 g/dL (ref 3.5–5.0)
Alkaline Phosphatase: 115 U/L (ref 38–126)
Anion gap: 14 (ref 5–15)
BUN: 20 mg/dL (ref 8–23)
CO2: 22 mmol/L (ref 22–32)
Calcium: 9.9 mg/dL (ref 8.9–10.3)
Chloride: 102 mmol/L (ref 98–111)
Creatinine: 0.86 mg/dL (ref 0.44–1.00)
GFR, Estimated: >60 mL/min (ref >60)
Glucose, Bld: 136 mg/dL — ABNORMAL HIGH (ref 70–99)
Potassium: 3.8 mmol/L (ref 3.5–5.1)
Sodium: 137 mmol/L (ref 135–145)
Total Bilirubin: 0.4 mg/dL (ref 0.0–1.2)
Total Protein: 7.1 g/dL (ref 6.5–8.1)

## 2024-04-05 LAB — CBC WITH DIFFERENTIAL (CANCER CENTER ONLY)
Abs Immature Granulocytes: 0.01 K/uL (ref 0.00–0.07)
Basophils Absolute: 0 K/uL (ref 0.0–0.1)
Basophils Relative: 1 %
Eosinophils Absolute: 0 K/uL (ref 0.0–0.5)
Eosinophils Relative: 0 %
HCT: 30.8 % — ABNORMAL LOW (ref 36.0–46.0)
Hemoglobin: 10.2 g/dL — ABNORMAL LOW (ref 12.0–15.0)
Immature Granulocytes: 0 %
Lymphocytes Relative: 29 %
Lymphs Abs: 1.2 K/uL (ref 0.7–4.0)
MCH: 30.9 pg (ref 26.0–34.0)
MCHC: 33.1 g/dL (ref 30.0–36.0)
MCV: 93.3 fL (ref 80.0–100.0)
Monocytes Absolute: 0.4 K/uL (ref 0.1–1.0)
Monocytes Relative: 10 %
Neutro Abs: 2.5 K/uL (ref 1.7–7.7)
Neutrophils Relative %: 60 %
Platelet Count: 179 K/uL (ref 150–400)
RBC: 3.3 MIL/uL — ABNORMAL LOW (ref 3.87–5.11)
RDW: 20.5 % — ABNORMAL HIGH (ref 11.5–15.5)
WBC Count: 4.1 K/uL (ref 4.0–10.5)
nRBC: 0 % (ref 0.0–0.2)

## 2024-04-05 MED ORDER — SODIUM CHLORIDE 0.9 % IV SOLN
512.4000 mg | Freq: Once | INTRAVENOUS | Status: AC
  Administered 2024-04-05: 510 mg via INTRAVENOUS
  Filled 2024-04-05: qty 51

## 2024-04-05 MED ORDER — FAMOTIDINE IN NACL 20-0.9 MG/50ML-% IV SOLN
20.0000 mg | Freq: Once | INTRAVENOUS | Status: AC
  Administered 2024-04-05: 20 mg via INTRAVENOUS
  Filled 2024-04-05: qty 50

## 2024-04-05 MED ORDER — DEXAMETHASONE SODIUM PHOSPHATE 10 MG/ML IJ SOLN
10.0000 mg | Freq: Once | INTRAMUSCULAR | Status: AC
  Administered 2024-04-05: 10 mg via INTRAVENOUS
  Filled 2024-04-05: qty 1

## 2024-04-05 MED ORDER — SODIUM CHLORIDE 0.9 % IV SOLN
150.0000 mg | Freq: Once | INTRAVENOUS | Status: AC
  Administered 2024-04-05: 150 mg via INTRAVENOUS
  Filled 2024-04-05: qty 150

## 2024-04-05 MED ORDER — PALONOSETRON HCL INJECTION 0.25 MG/5ML
0.2500 mg | Freq: Once | INTRAVENOUS | Status: AC
  Administered 2024-04-05: 0.25 mg via INTRAVENOUS
  Filled 2024-04-05: qty 5

## 2024-04-05 MED ORDER — CETIRIZINE HCL 10 MG/ML IV SOLN
10.0000 mg | Freq: Once | INTRAVENOUS | Status: AC
  Administered 2024-04-05: 10 mg via INTRAVENOUS
  Filled 2024-04-05: qty 1

## 2024-04-05 MED ORDER — SODIUM CHLORIDE 0.9 % IV SOLN: INTRAVENOUS | Status: DC

## 2024-04-05 MED ORDER — SODIUM CHLORIDE 0.9 % IV SOLN
140.0000 mg/m2 | Freq: Once | INTRAVENOUS | Status: AC
  Administered 2024-04-05: 342 mg via INTRAVENOUS
  Filled 2024-04-05: qty 57

## 2024-04-05 NOTE — Progress Notes (Signed)
 Patient seen by Dr. Arley Hof today  Vitals are within treatment parameters:Yes   Labs are within treatment parameters: Yes   Treatment plan has been signed: Yes   Per physician team, Patient is ready for treatment. Please note the following modifications: dose reductions for weight loss

## 2024-04-05 NOTE — Progress Notes (Signed)
 Scanned document reviewed

## 2024-04-05 NOTE — Progress Notes (Signed)
 [facility] OFFICE PROGRESS NOTE   Diagnosis: Gynecologic cancer  INTERVAL HISTORY:   Susan Frederick completed another cycle of paclitaxel /carboplatin  and G-CSF beginning 03/15/2024.  No nausea/vomiting.  She has mild numbness in the feet at night.  She had bone pain after chemotherapy and lasting for approximately 1 week.  The pain was more tolerable with this cycle.  The pain began prior to G-CSF therapy. Her appetite has improved.  Abdominal pain has improved.  She continues to have dyspnea.  This has also improved.  Objective:  Vital signs in last 24 hours:  Blood pressure 134/75, pulse 95, temperature 97.8 F (36.6 C), temperature source Temporal, resp. rate 18, height 5' 6 (1.676 m), weight 287 lb 4.8 oz (130.3 kg), SpO2 100%.    HEENT: No thrush or ulcers Resp: Lungs clear bilaterally Cardio: Regular rate and rhythm GI: No hepatosplenomegaly, mild diffuse tenderness, no mass, no apparent ascites Vascular: No leg erythema or edema  Portacath/PICC-without erythema  Lab Results:  Lab Results  Component Value Date   WBC 4.1 04/05/2024   HGB 10.2 (L) 04/05/2024   HCT 30.8 (L) 04/05/2024   MCV 93.3 04/05/2024   PLT 179 04/05/2024   NEUTROABS 2.5 04/05/2024    CMP  Lab Results  Component Value Date   NA 138 03/14/2024   K 3.9 03/14/2024   CL 101 03/14/2024   CO2 25 03/14/2024   GLUCOSE 123 (H) 03/14/2024   BUN 14 03/14/2024   CREATININE 0.69 03/14/2024   CALCIUM 9.9 03/14/2024   PROT 7.0 03/14/2024   ALBUMIN 4.0 03/14/2024   AST 18 03/14/2024   ALT 17 03/14/2024   ALKPHOS 114 03/14/2024   BILITOT 0.4 03/14/2024   GFRNONAA >60 03/14/2024   GFRAA >60 09/10/2016    Lab Results  Component Value Date   CEA <1.00 01/11/2024   RJW800 7 01/11/2024    Medications: I have reviewed the patient's current medications.   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) 12/27/2023: CT abdomen/pelvis-extensive abnormal diffuse  stranding and nodularity of the omentum and mesentery, small volume ascites, small periumbilical hernia with soft tissue nodularity, enlarged aortocaval retroperitoneal node 01/05/2024: PET-hypermetabolic lymph nodes in the mediastinum, bilateral internal mammary, and right pericardial areas, diffuse carcinomatosis, small hypermetabolic retroperitoneal nodes, no primary lesion identified 01/18/2024: CT-guided biopsy of left peritoneal tumor-metastatic high-grade serous carcinoma, PAX8 and WT1 positive Elevated CA125 Cycle 1 paclitaxel /carboplatin  02/02/2024, Udenyca  Cycle 2 paclitaxel /carboplatin  02/23/2024, Udenyca  Cycle 3 paclitaxel /carboplatin  03/15/2024, chemotherapy dose reduced due to thrombocytopenia and arthralgias; Udenyca  03/28/2024 CTs: No enlarged mediastinal nodes, no ascites, mild diffuse peritoneal thickening and nodularity-significantly improved, low-attenuation anterior liver lesion unchanged, new 3 mm left lower lobe nodule Cycle 4 paclitaxel /carboplatin  04/05/2024, Udenyca    Abdominal pain secondary to 1 Anorexia/weight loss secondary to #1 Hypertension Arthritis Benign endocervix polyp 2019 Family history of breast, endometrial, lung, and head neck cancer-negative genetic testing 02/17/2024    Disposition: Ms. Susan Frederick has completed 3 cycles of paclitaxel /carboplatin .  Her clinical status and the CA125 have improved.  The restaging CTs are consistent with a response to therapy.  I reviewed the CT findings and images with Susan Frederick. She saw Dr. Eldonna and debulking surgery is recommended.  She is scheduled for surgery on 05/02/2024.  Susan Frederick will complete another cycle of paclitaxel /carboplatin  today.  We will follow-up on the CA125 from today.  She will return for an office and lab visit 04/27/2024.  She will follow-up with her cardiologist prior to surgery to evaluate the persistent  dyspnea and coronary artery calcifications seen on CT.  We will request an addendum on the CT report  regarding previously noted hypermetabolic mediastinal and internal mammary lymph nodes.  Arley Hof, MD  04/05/2024  9:09 AM

## 2024-04-05 NOTE — Patient Instructions (Signed)
 [facility] - A DEPT OF [facility]  Discharge Instructions: Thank you for choosing [facility] to provide your oncology and hematology care.   If you have a lab appointment with the Cancer Center, please go directly to the Cancer Center and check in at the registration area.   Wear comfortable clothing and clothing appropriate for easy access to any Portacath or PICC line.   We strive to give you quality time with your provider. You may need to reschedule your appointment if you arrive late (15 or more minutes).  Arriving late affects you and other patients whose appointments are after yours.  Also, if you miss three or more appointments without notifying the office, you may be dismissed from the clinic at the provider's discretion.      For prescription refill requests, have your pharmacy contact our office and allow 72 hours for refills to be completed.    Today you received the following chemotherapy and/or immunotherapy agents: taxol  and carboplatin .      To help prevent nausea and vomiting after your treatment, we encourage you to take your nausea medication as directed.  BELOW ARE SYMPTOMS THAT SHOULD BE REPORTED IMMEDIATELY: *FEVER GREATER THAN 100.4 F (38 C) OR HIGHER *CHILLS OR SWEATING *NAUSEA AND VOMITING THAT IS NOT CONTROLLED WITH YOUR NAUSEA MEDICATION *UNUSUAL SHORTNESS OF BREATH *UNUSUAL BRUISING OR BLEEDING *URINARY PROBLEMS (pain or burning when urinating, or frequent urination) *BOWEL PROBLEMS (unusual diarrhea, constipation, pain near the anus) TENDERNESS IN MOUTH AND THROAT WITH OR WITHOUT PRESENCE OF ULCERS (sore throat, sores in mouth, or a toothache) UNUSUAL RASH, SWELLING OR PAIN  UNUSUAL VAGINAL DISCHARGE OR ITCHING   Items with * indicate a potential emergency and should be followed up as soon as possible or go to the Emergency Department if any problems should occur.  Please show the CHEMOTHERAPY ALERT CARD or  IMMUNOTHERAPY ALERT CARD at check-in to the Emergency Department and triage nurse.  Should you have questions after your visit or need to cancel or reschedule your appointment, please contact [facility] - A DEPT OF [facility]  Dept: [phone]  and follow the prompts.  Office hours are 8:00 a.m. to 4:30 p.m. Monday - Friday. Please note that voicemails left after 4:00 p.m. may not be returned until the following business day.  We are closed weekends and major holidays. You have access to a nurse at all times for urgent questions. Please call the main number to the clinic Dept: [phone] and follow the prompts.   For any non-urgent questions, you may also contact your provider using MyChart. We now offer e-Visits for anyone 56 and older to request care online for non-urgent symptoms. For details visit [email or web address].   Also download the MyChart app! Go to the app store, search MyChart, open the app, select [facility], and log in with your MyChart username and password.

## 2024-04-05 NOTE — Telephone Encounter (Signed)
 Notified Susan Frederick that scheduler will be calling her to set up her injection appointment for 8/29 and will need to have CA125 added as well. Lab not able to add this to labs collected today.

## 2024-04-05 NOTE — Telephone Encounter (Signed)
 Called PT to confirm appt time for 8/29 @ 12pm.

## 2024-04-07 ENCOUNTER — Inpatient Hospital Stay

## 2024-04-07 ENCOUNTER — Other Ambulatory Visit (HOSPITAL_BASED_OUTPATIENT_CLINIC_OR_DEPARTMENT_OTHER): Payer: Self-pay

## 2024-04-07 ENCOUNTER — Encounter: Payer: Self-pay | Admitting: Oncology

## 2024-04-07 ENCOUNTER — Other Ambulatory Visit: Payer: Self-pay | Admitting: Gynecologic Oncology

## 2024-04-07 VITALS — BP 125/54 | HR 71 | Temp 97.9°F | Resp 18

## 2024-04-07 DIAGNOSIS — C579 Malignant neoplasm of female genital organ, unspecified: Secondary | ICD-10-CM

## 2024-04-07 DIAGNOSIS — C569 Malignant neoplasm of unspecified ovary: Secondary | ICD-10-CM

## 2024-04-07 DIAGNOSIS — C799 Secondary malignant neoplasm of unspecified site: Secondary | ICD-10-CM

## 2024-04-07 DIAGNOSIS — Z5111 Encounter for antineoplastic chemotherapy: Secondary | ICD-10-CM | POA: Diagnosis not present

## 2024-04-07 MED ORDER — OXYCODONE HCL 5 MG PO TABS
5.0000 mg | ORAL_TABLET | Freq: Four times a day (QID) | ORAL | 0 refills | Status: AC | PRN
  Filled 2024-04-07: qty 15, 4d supply, fill #0

## 2024-04-07 MED ORDER — PEGFILGRASTIM-CBQV 6 MG/0.6ML ~~LOC~~ SOSY
6.0000 mg | PREFILLED_SYRINGE | Freq: Once | SUBCUTANEOUS | Status: AC
  Administered 2024-04-07: 6 mg via SUBCUTANEOUS
  Filled 2024-04-07: qty 0.6

## 2024-04-07 MED ORDER — SENNOSIDES-DOCUSATE SODIUM 8.6-50 MG PO TABS
2.0000 | ORAL_TABLET | Freq: Every day | ORAL | 0 refills | Status: DC
  Filled 2024-04-07: qty 30, 15d supply, fill #0

## 2024-04-07 NOTE — Progress Notes (Signed)
 Drew CA-125 per pt and phone note from Devere Leaven, Charity fundraiser. Did not have the correct tube to add into earlier this week.

## 2024-04-07 NOTE — Patient Instructions (Signed)

## 2024-04-07 NOTE — Progress Notes (Signed)
Post-op meds sent in preop. 

## 2024-04-08 LAB — CA 125: Cancer Antigen (CA) 125: 59.4 U/mL — ABNORMAL HIGH (ref 0.0–38.1)

## 2024-04-09 ENCOUNTER — Encounter: Payer: Self-pay | Admitting: Nurse Practitioner

## 2024-04-12 NOTE — Telephone Encounter (Signed)
 Received PCP clearance.

## 2024-04-19 ENCOUNTER — Telehealth: Payer: Self-pay

## 2024-04-19 NOTE — Telephone Encounter (Signed)
 LVM for pt regarding her claims Form. Will f/u later in the week.

## 2024-04-20 ENCOUNTER — Other Ambulatory Visit (HOSPITAL_BASED_OUTPATIENT_CLINIC_OR_DEPARTMENT_OTHER): Payer: Self-pay

## 2024-04-20 ENCOUNTER — Telehealth: Payer: Self-pay | Admitting: Oncology

## 2024-04-20 ENCOUNTER — Encounter: Payer: Self-pay | Admitting: Oncology

## 2024-04-20 ENCOUNTER — Other Ambulatory Visit: Payer: Self-pay | Admitting: Gynecologic Oncology

## 2024-04-20 DIAGNOSIS — C569 Malignant neoplasm of unspecified ovary: Secondary | ICD-10-CM

## 2024-04-20 MED ORDER — METRONIDAZOLE 500 MG PO TABS
ORAL_TABLET | ORAL | 0 refills | Status: DC
  Filled 2024-04-20: qty 6, 1d supply, fill #0

## 2024-04-20 MED ORDER — BISACODYL 5 MG PO TBEC
DELAYED_RELEASE_TABLET | ORAL | 0 refills | Status: DC
  Filled 2024-04-20: qty 2, 1d supply, fill #0

## 2024-04-20 MED ORDER — POLYETHYLENE GLYCOL 3350 17 GM/SCOOP PO POWD
ORAL | 0 refills | Status: DC
  Filled 2024-04-20: qty 238, 1d supply, fill #0

## 2024-04-20 NOTE — Telephone Encounter (Signed)
 Called Susan Frederick and let her know that her bowel prep prescriptions have been sent to her pharmacy.  Also let her know to not take Mobic  for 7-10 days before surgery and not while she is taking the blood thinner after surgery.  She verbalized understanding and said she has not been taking it for a couple of months.

## 2024-04-21 ENCOUNTER — Telehealth: Payer: Self-pay

## 2024-04-21 NOTE — Telephone Encounter (Signed)
 LMOM for the patient to call the office back. Need to see when the cardiology and stress test appts are scheduled

## 2024-04-21 NOTE — Telephone Encounter (Signed)
 Patient called the office back and explained that she had her stress test today

## 2024-04-21 NOTE — Telephone Encounter (Signed)
 Return the pt call regarding her Cancer Claim form.I let her know that her form was misplaced, and if she can send me another one. Pt stated that she would emailed it.

## 2024-04-25 NOTE — Progress Notes (Signed)
 WOC RN Melody came in and marked patient for potential ostomy.  COVID Vaccine Completed: yes  Date of COVID positive in last 90 days:  PCP - Tylene Meres ,PA Cardiologist - Redell Montenegro, MD  Medical clearance by Tylene Meres, PA 04/11/24 in Epic  Cardiac clearance in media tab dated 04/27/24  Chest x-ray - CT 03/28/24 Epic EKG - 12/21/23 [facility] Stress Test - 04/21/24 [facility] ECHO - 01/04/24 [facility] Cardiac Cath - n/a Pacemaker/ICD device last checked:N/A Spinal Cord Stimulator:N/A  Bowel Prep - N/A  Sleep Study - yes CPAP -  yes every night   Fasting Blood Sugar - preDM, no meds or checks at home Checks Blood Sugar _____ times a day  Last dose of GLP1 agonist-  N/A GLP1 instructions:  Do not take after     Last dose of SGLT-2 inhibitors-  N/A SGLT-2 instructions:  Do not take after     Blood Thinner Instructions: N/A Last dose: n/a Time: Aspirin Instructions:N/A Last Dose:  Activity level: Can go up a flight of stairs and perform activities of daily living without stopping and without symptoms of chest pain. Endorses SOB with activity, not changed per pt  Anesthesia review: need cardiac clearance   Patient denies shortness of breath, fever, cough and chest pain at PAT appointment  Patient verbalized understanding of instructions that were given to them at the PAT appointment. Patient was also instructed that they will need to review over the PAT instructions again at home before surgery.

## 2024-04-25 NOTE — Patient Instructions (Signed)
 SURGICAL WAITING ROOM VISITATION  Patients having surgery or a procedure may have no more than 2 support people in the waiting area - these visitors may rotate.    Children under the age of 24 must have an adult with them who is not the patient.  Visitors with respiratory illnesses are discouraged from visiting and should remain at home.  If the patient needs to stay at the hospital during part of their recovery, the visitor guidelines for inpatient rooms apply. Pre-op nurse will coordinate an appropriate time for 1 support person to accompany patient in pre-op.  This support person may not rotate.    Please refer to the [facility] website for the visitor guidelines for Inpatients (after your surgery is over and you are in a regular room).    Your procedure is scheduled on: 05/02/24   Report to [facility] Main Entrance    Report to admitting at 4:30 AM   Call this number if you have problems the morning of surgery [phone]   Do not eat food :After Midnight.   After Midnight you may have the following liquids until 4:30 AM DAY OF SURGERY  Water Non-Citrus Juices (without pulp, NO RED-Apple, White grape, White cranberry) Black Coffee (NO MILK/CREAM OR CREAMERS, sugar ok)  Clear Tea (NO MILK/CREAM OR CREAMERS, sugar ok) regular and decaf                             Plain Jell-O (NO RED)                                           Fruit ices (not with fruit pulp, NO RED)                                     Popsicles (NO RED)                                                               Sports drinks like Gatorade (NO RED)                      If you have questions, please contact your surgeon's office.   FOLLOW BOWEL PREP AND ANY ADDITIONAL PRE OP INSTRUCTIONS YOU RECEIVED FROM YOUR SURGEON'S OFFICE!!!     Oral Hygiene is also important to reduce your risk of infection.                                    Remember - BRUSH YOUR TEETH THE MORNING OF SURGERY WITH YOUR  REGULAR TOOTHPASTE  DENTURES WILL BE REMOVED PRIOR TO SURGERY PLEASE DO NOT APPLY Poly grip OR ADHESIVES!!!   Stop all vitamins and herbal supplements 7 days before surgery.   Take these medicines the morning of surgery with A SIP OF WATER: Oxycodone   You may not have any metal on your body including hair pins, jewelry, and body piercing             Do not wear make-up, lotions, powders, perfumes, or deodorant  Do not wear nail polish including gel and S&S, artificial/acrylic nails, or any other type of covering on natural nails including finger and toenails. If you have artificial nails, gel coating, etc. that needs to be removed by a nail salon please have this removed prior to surgery or surgery may need to be canceled/ delayed if the surgeon/ anesthesia feels like they are unable to be safely monitored.   Do not shave  48 hours prior to surgery.    Do not bring valuables to the hospital. [facility] IS NOT             RESPONSIBLE   FOR VALUABLES.   Contacts, glasses, dentures or bridgework may not be worn into surgery.   Bring small overnight bag day of surgery.   DO NOT BRING YOUR HOME MEDICATIONS TO THE HOSPITAL. PHARMACY WILL DISPENSE MEDICATIONS LISTED ON YOUR MEDICATION LIST TO YOU DURING YOUR ADMISSION IN THE HOSPITAL!    Patients discharged on the day of surgery will not be allowed to drive home.  Someone NEEDS to stay with you for the first 24 hours after anesthesia.              Please read over the following fact sheets you were given: IF YOU HAVE QUESTIONS ABOUT YOUR PRE-OP INSTRUCTIONS PLEASE CALL [phone].   If you received a COVID test during your pre-op visit  it is requested that you wear a mask when out in public, stay away from anyone that may not be feeling well and notify your surgeon if you develop symptoms. If you test positive for Covid or have been in contact with anyone that has tested positive in the last 10 days  please notify you surgeon.    [place] - Preparing for Surgery Before surgery, you can play an important role.  Because skin is not sterile, your skin needs to be as free of germs as possible.  You can reduce the number of germs on your skin by washing with CHG (chlorahexidine gluconate) soap before surgery.  CHG is an antiseptic cleaner which kills germs and bonds with the skin to continue killing germs even after washing. Please DO NOT use if you have an allergy to CHG or antibacterial soaps.  If your skin becomes reddened/irritated stop using the CHG and inform your nurse when you arrive at Short Stay. Do not shave (including legs and underarms) for at least 48 hours prior to the first CHG shower.  You may shave your face/neck.  Please follow these instructions carefully:  1.  Shower with CHG Soap the night before surgery and the  morning of surgery.  2.  If you choose to wash your hair, wash your hair first as usual with your normal  shampoo.  3.  After you shampoo, rinse your hair and body thoroughly to remove the shampoo.                             4.  Use CHG as you would any other liquid soap.  You can apply chg directly to the skin and wash.  Gently with a scrungie or clean washcloth.  5.  Apply the CHG Soap to your body ONLY FROM THE NECK  DOWN.   Do   not use on face/ open                           Wound or open sores. Avoid contact with eyes, ears mouth and   genitals (private parts).                       Wash face,  Genitals (private parts) with your normal soap.             6.  Wash thoroughly, paying special attention to the area where your    surgery  will be performed.  7.  Thoroughly rinse your body with warm water from the neck down.  8.  DO NOT shower/wash with your normal soap after using and rinsing off the CHG Soap.                9.  Pat yourself dry with a clean towel.            10.  Wear clean pajamas.            11.  Place clean sheets on your bed the night of your  first shower and do not  sleep with pets. Day of Surgery : Do not apply any lotions/deodorants the morning of surgery.  Please wear clean clothes to the hospital/surgery center.  FAILURE TO FOLLOW THESE INSTRUCTIONS MAY RESULT IN THE CANCELLATION OF YOUR SURGERY  PATIENT SIGNATURE_________________________________  NURSE SIGNATURE__________________________________  ________________________________________________________________________ WHAT IS A BLOOD TRANSFUSION? Blood Transfusion Information  A transfusion is the replacement of blood or some of its parts. Blood is made up of multiple cells which provide different functions. Red blood cells carry oxygen and are used for blood loss replacement. White blood cells fight against infection. Platelets control bleeding. Plasma helps clot blood. Other blood products are available for specialized needs, such as hemophilia or other clotting disorders. BEFORE THE TRANSFUSION  Who gives blood for transfusions?  Healthy volunteers who are fully evaluated to make sure their blood is safe. This is blood bank blood. Transfusion therapy is the safest it has ever been in the practice of medicine. Before blood is taken from a donor, a complete history is taken to make sure that person has no history of diseases nor engages in risky social behavior (examples are intravenous drug use or sexual activity with multiple partners). The donor's travel history is screened to minimize risk of transmitting infections, such as malaria. The donated blood is tested for signs of infectious diseases, such as HIV and hepatitis. The blood is then tested to be sure it is compatible with you in order to minimize the chance of a transfusion reaction. If you or a relative donates blood, this is often done in anticipation of surgery and is not appropriate for emergency situations. It takes many days to process the donated blood. RISKS AND COMPLICATIONS Although transfusion therapy  is very safe and saves many lives, the main dangers of transfusion include:  Getting an infectious disease. Developing a transfusion reaction. This is an allergic reaction to something in the blood you were given. Every precaution is taken to prevent this. The decision to have a blood transfusion has been considered carefully by your caregiver before blood is given. Blood is not given unless the benefits outweigh the risks. AFTER THE TRANSFUSION Right after receiving a blood transfusion, you will usually feel much better and more energetic. This is  especially true if your red blood cells have gotten low (anemic). The transfusion raises the level of the red blood cells which carry oxygen, and this usually causes an energy increase. The nurse administering the transfusion will monitor you carefully for complications. HOME CARE INSTRUCTIONS  No special instructions are needed after a transfusion. You may find your energy is better. Speak with your caregiver about any limitations on activity for underlying diseases you may have. SEEK MEDICAL CARE IF:  Your condition is not improving after your transfusion. You develop redness or irritation at the intravenous (IV) site. SEEK IMMEDIATE MEDICAL CARE IF:  Any of the following symptoms occur over the next 12 hours: Shaking chills. You have a temperature by mouth above 102 F (38.9 C), not controlled by medicine. Chest, back, or muscle pain. People around you feel you are not acting correctly or are confused. Shortness of breath or difficulty breathing. Dizziness and fainting. You get a rash or develop hives. You have a decrease in urine output. Your urine turns a dark color or changes to pink, red, or brown. Any of the following symptoms occur over the next 10 days: You have a temperature by mouth above 102 F (38.9 C), not controlled by medicine. Shortness of breath. Weakness after normal activity. The white part of the eye turns yellow  (jaundice). You have a decrease in the amount of urine or are urinating less often. Your urine turns a dark color or changes to pink, red, or brown. Document Released: 07/24/2000 Document Revised: 10/19/2011 Document Reviewed: 03/12/2008 [facility] Patient Information 2014 [place], [place].  _______________________________________________________________________

## 2024-04-26 ENCOUNTER — Encounter (HOSPITAL_COMMUNITY)
Admission: RE | Admit: 2024-04-26 | Discharge: 2024-04-26 | Disposition: A | Discharge status: Home / Self Care | Source: Ambulatory Visit | Attending: Psychiatry | Admitting: Psychiatry

## 2024-04-26 ENCOUNTER — Other Ambulatory Visit: Payer: Self-pay

## 2024-04-26 ENCOUNTER — Encounter (HOSPITAL_COMMUNITY): Payer: Self-pay

## 2024-04-26 DIAGNOSIS — G4733 Obstructive sleep apnea (adult) (pediatric): Secondary | ICD-10-CM | POA: Diagnosis not present

## 2024-04-26 DIAGNOSIS — I2510 Atherosclerotic heart disease of native coronary artery without angina pectoris: Secondary | ICD-10-CM | POA: Insufficient documentation

## 2024-04-26 DIAGNOSIS — E669 Obesity, unspecified: Secondary | ICD-10-CM | POA: Diagnosis not present

## 2024-04-26 DIAGNOSIS — Z01812 Encounter for preprocedural laboratory examination: Secondary | ICD-10-CM | POA: Diagnosis present

## 2024-04-26 DIAGNOSIS — R911 Solitary pulmonary nodule: Secondary | ICD-10-CM | POA: Insufficient documentation

## 2024-04-26 DIAGNOSIS — I700 Atherosclerosis of aorta: Secondary | ICD-10-CM | POA: Diagnosis not present

## 2024-04-26 DIAGNOSIS — Z6841 Body Mass Index (BMI) 40.0 and over, adult: Secondary | ICD-10-CM | POA: Insufficient documentation

## 2024-04-26 DIAGNOSIS — I10 Essential (primary) hypertension: Secondary | ICD-10-CM | POA: Diagnosis not present

## 2024-04-26 DIAGNOSIS — C579 Malignant neoplasm of female genital organ, unspecified: Secondary | ICD-10-CM | POA: Diagnosis present

## 2024-04-26 HISTORY — DX: Prediabetes: R73.03

## 2024-04-26 LAB — COMPREHENSIVE METABOLIC PANEL WITH GFR
ALT: 11 U/L (ref 0–44)
AST: 16 U/L (ref 15–41)
Albumin: 4.2 g/dL (ref 3.5–5.0)
Alkaline Phosphatase: 108 U/L (ref 38–126)
Anion gap: 12 (ref 5–15)
BUN: 14 mg/dL (ref 8–23)
CO2: 25 mmol/L (ref 22–32)
Calcium: 9.8 mg/dL (ref 8.9–10.3)
Chloride: 103 mmol/L (ref 98–111)
Creatinine, Ser: 0.63 mg/dL (ref 0.44–1.00)
GFR, Estimated: >60 mL/min (ref >60)
Glucose, Bld: 120 mg/dL — ABNORMAL HIGH (ref 70–99)
Potassium: 4.2 mmol/L (ref 3.5–5.1)
Sodium: 139 mmol/L (ref 135–145)
Total Bilirubin: 0.3 mg/dL (ref 0.0–1.2)
Total Protein: 7.1 g/dL (ref 6.5–8.1)

## 2024-04-26 LAB — CBC WITH DIFFERENTIAL/PLATELET
Abs Immature Granulocytes: 0.01 K/uL (ref 0.00–0.07)
Basophils Absolute: 0 K/uL (ref 0.0–0.1)
Basophils Relative: 0 %
Eosinophils Absolute: 0 K/uL (ref 0.0–0.5)
Eosinophils Relative: 0 %
HCT: 33 % — ABNORMAL LOW (ref 36.0–46.0)
Hemoglobin: 10.4 g/dL — ABNORMAL LOW (ref 12.0–15.0)
Immature Granulocytes: 0 %
Lymphocytes Relative: 21 %
Lymphs Abs: 1.1 K/uL (ref 0.7–4.0)
MCH: 31.2 pg (ref 26.0–34.0)
MCHC: 31.5 g/dL (ref 30.0–36.0)
MCV: 99.1 fL (ref 80.0–100.0)
Monocytes Absolute: 0.4 K/uL (ref 0.1–1.0)
Monocytes Relative: 8 %
Neutro Abs: 3.8 K/uL (ref 1.7–7.7)
Neutrophils Relative %: 71 %
Platelets: 174 K/uL (ref 150–400)
RBC: 3.33 MIL/uL — ABNORMAL LOW (ref 3.87–5.11)
RDW: 19 % — ABNORMAL HIGH (ref 11.5–15.5)
WBC: 5.3 K/uL (ref 4.0–10.5)
nRBC: 0 % (ref 0.0–0.2)

## 2024-04-26 NOTE — Telephone Encounter (Signed)
 Notified patient of test results and surgery clearance as documented by Dr isabell. Patient voiced understanding.

## 2024-04-26 NOTE — Consult Note (Signed)
 WOC Nurse requested for preoperative stoma site marking  Discussed surgical procedure and stoma creation with patient and family.  Explained role of the WOC nurse team.  Provided the patient with educational booklet and provided samples of pouching options.  Answered patient and family questions.   Examined patient sitting, and standing in order to place the marking in the patient's visual field, away from any creases or abdominal contour issues and within the rectus muscle.  Attempted to mark below the patient's belt line.   Marked for colostomy in the LLQ  __7__ cm to the left of the umbilicus and __3__cm above the umbilicus.  Marked for ileostomy in the RLQ  __6__cm to the right of the umbilicus and  __2__ cm above the umbilicus.     Patient's abdomen cleansed with CHG wipes at site markings, allowed to air dry prior to marking.Covered mark with thin film transparent dressing to preserve mark until date of surgery.   WOC Nurse team will follow up with patient after surgery for continue ostomy care and teaching.  Kelii Chittum [facility] MSN, RN,CWOCN, CNS, [employer] [phone]

## 2024-04-27 ENCOUNTER — Encounter: Payer: Self-pay | Admitting: Oncology

## 2024-04-27 ENCOUNTER — Inpatient Hospital Stay: Attending: Physician Assistant | Admitting: Nurse Practitioner

## 2024-04-27 ENCOUNTER — Encounter (HOSPITAL_COMMUNITY): Payer: Self-pay

## 2024-04-27 ENCOUNTER — Other Ambulatory Visit (HOSPITAL_BASED_OUTPATIENT_CLINIC_OR_DEPARTMENT_OTHER): Payer: Self-pay

## 2024-04-27 ENCOUNTER — Telehealth: Payer: Self-pay

## 2024-04-27 ENCOUNTER — Ambulatory Visit: Admitting: Nurse Practitioner

## 2024-04-27 ENCOUNTER — Other Ambulatory Visit

## 2024-04-27 ENCOUNTER — Encounter: Payer: Self-pay | Admitting: Nurse Practitioner

## 2024-04-27 ENCOUNTER — Inpatient Hospital Stay

## 2024-04-27 DIAGNOSIS — R39198 Other difficulties with micturition: Secondary | ICD-10-CM | POA: Diagnosis not present

## 2024-04-27 DIAGNOSIS — C772 Secondary and unspecified malignant neoplasm of intra-abdominal lymph nodes: Secondary | ICD-10-CM | POA: Insufficient documentation

## 2024-04-27 DIAGNOSIS — C579 Malignant neoplasm of female genital organ, unspecified: Secondary | ICD-10-CM | POA: Insufficient documentation

## 2024-04-27 DIAGNOSIS — Z9221 Personal history of antineoplastic chemotherapy: Secondary | ICD-10-CM | POA: Insufficient documentation

## 2024-04-27 DIAGNOSIS — C786 Secondary malignant neoplasm of retroperitoneum and peritoneum: Secondary | ICD-10-CM | POA: Diagnosis not present

## 2024-04-27 DIAGNOSIS — R971 Elevated cancer antigen 125 [CA 125]: Secondary | ICD-10-CM | POA: Diagnosis not present

## 2024-04-27 DIAGNOSIS — C799 Secondary malignant neoplasm of unspecified site: Secondary | ICD-10-CM

## 2024-04-27 MED ORDER — LORAZEPAM 0.5 MG PO TABS
0.5000 mg | ORAL_TABLET | Freq: Every evening | ORAL | 0 refills | Status: AC | PRN
  Filled 2024-04-27: qty 30, 30d supply, fill #0

## 2024-04-27 NOTE — Progress Notes (Addendum)
 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  Echo and stress test were ordered.  Echo showed normal LVEF of 55 to 60% with no other abnormalities.  Stress test on 04/21/2024 shows: Mildly abnormal perfusion imaging. There is a mild in intensity, small in size, fixed defect in the mid to apical anterior segments.There is no corresponding wall motion abnormality. Cardiac clearance form obtained from Gyn-Onc (scanned in media on 9/18) stating patient is optimized and moderate risk.  Patient seen by PCP on 04/11/2024 for preop clearance.  Medical clearance signed  VS: BP (!) 144/89   Pulse (!) 16   Temp 36.8 C (Oral)   Resp 16   Ht 5' 6 (1.676 m)   Wt 130.2 kg   SpO2 99%   BMI 46.32 kg/m   PROVIDERS: Samie Frederick, PA-C   LABS: Labs reviewed: Acceptable for surgery.  Anemia stable (all labs ordered are listed, but only abnormal results are displayed)  Labs Reviewed  CBC WITH DIFFERENTIAL/PLATELET - Abnormal; Notable for the following components:      Result Value   RBC 3.33 (*)    Hemoglobin 10.4 (*)    HCT 33.0 (*)    RDW 19.0 (*)    All other components within normal limits  COMPREHENSIVE METABOLIC PANEL WITH GFR - Abnormal; Notable for the following components:   Glucose, Bld 120 (*)    All other components within normal limits  TYPE AND SCREEN      IMAGES:  CT chest/abdomen/pelvis 03/28/2024:  IMPRESSION: 1. Mild, diffuse peritoneal thickening with stranding and nodularity of the omentum, consistent with peritoneal carcinomatosis, significantly improved compared to prior PET-CT. Previously seen ascites resolved. 2. Low-attenuation lesion of the anterior liver measuring 1.1 x 0.7 cm, incompletely characterized, not significantly changed, possibly an incidentally present cyst or hemangioma although liver metastasis not excluded. This was not PET avid on prior examination. 3. Tiny nodule of the peripheral left lower lobe measuring 0.3 cm, nonspecific although most likely benign and incidental. This was not confidently visualized on free breathing PET-CT dated 01/05/2024. Attention on follow-up. 4. Diffuse bilateral bronchial wall thickening, consistent with nonspecific infectious or inflammatory bronchitis. 5. Coronary artery disease.   Aortic Atherosclerosis (ICD10-I70.0).    EKG 12/21/2023 ([facility]-report only):  Impression Sinus Rhythm Low voltage in precordial leads.  CV:  Stress test 04/21/2024 ([facility]):  SPECT findings: #  Analysis of the raw images reveals significant motion during stress images. And significant patient motion was noted during the rest acquisition. #  There is a small fixed defect which is mild in intensity located in the mid to apical anterior segments. #  The gated images reveal normal LV systolic function and wall motion with calculated ejection fraction of 75 %. #  Subjectively, there is no transient ischemic dilatation and the calculated ratio is normal at 1.00 #  CONCLUSIONS: #   Mildly abnormal perfusion imaging. There is a mild in intensity, small in size, fixed defect in the mid to apical anterior segments. There is no corresponding wall motion abnormality.   #   Normal LV systolic function  Echo 01/04/2024 ([facility]):  Impression Left Ventricle: EF: 55-60%.   Left Ventricle: Wall  motion is normal.   Left Ventricle: Doppler parameters indicate normal diastolic function.   Left Atrium: Left atrium size is normal.   Right Ventricle: Systolic function is normal.   Aortic Valve: The aortic valve is tricuspid. The leaflets are not thickened and exhibit normal excursion.   Mitral Valve: There is no mitral regurgitation.   Pericardium: There is no pericardial effusion.  Past Medical History:  Diagnosis Date   Arthritis    Cancer (HCC)    GYY,METS WITH OMENTUM   DDD (degenerative disc disease), lumbar    Diverticulosis of colon    Hematuria    Hypertension    Mixed stress and urge urinary incontinence    Obesity    OSA on CPAP    Pre-diabetes     Past Surgical History:  Procedure Laterality Date   ANKLE FUSION WITH GASTROC SLIDE Left 2010   COLONOSCOPY     HAND TENDON SURGERY     right   IR IMAGING GUIDED PORT INSERTION  01/18/2024   KNEE ARTHROSCOPY     left   NOSE SURGERY     deviated septum   POLYPECTOMY     ROOT CANAL  2025   x2   TOE SURGERY     right foot/ little toe   WISDOM TOOTH EXTRACTION  08/11/1967    MEDICATIONS:  Acetaminophen  500 MG capsule   [START ON 05/01/2024] bisacodyl  (DULCOLAX) 5 MG EC tablet   Boswellia-Glucosamine-Vit D (OSTEO BI-FLEX ONE PER DAY PO)   ibuprofen (ADVIL) 200 MG tablet   lidocaine -prilocaine  (EMLA ) cream   lisinopril  (ZESTRIL ) 20 MG tablet   loratadine (CLARITIN) 10 MG tablet   LORazepam  (ATIVAN ) 0.5 MG tablet   meloxicam  (MOBIC ) 15 MG tablet   methocarbamol (ROBAXIN) 500 MG tablet   [START ON 05/01/2024] metroNIDAZOLE  (FLAGYL ) 500 MG tablet   Multiple Vitamins-Minerals (MULTIVITAMIN WITH MINERALS) tablet   Multiple Vitamins-Minerals (PRESERVISION AREDS 2) CAPS   oxyCODONE  (OXY IR/ROXICODONE ) 5 MG immediate release tablet   Polyethylene Glycol 3350  (MIRALAX  PO)   [START ON 05/01/2024] polyethylene glycol powder (MIRALAX ) 17 GM/SCOOP powder   senna (SENOKOT) 8.6 MG TABS tablet   senna-docusate (SENOKOT-S)  8.6-50 MG tablet   sertraline  (ZOLOFT ) 50 MG tablet   No current facility-administered medications for this encounter.   Burnard CHRISTELLA Odis DEVONNA [facility] Short Stay/Anesthesiology [facility] Phone [phone] 04/27/2024 1:19 PM

## 2024-04-27 NOTE — Telephone Encounter (Signed)
Received cardiology clearance

## 2024-04-27 NOTE — Anesthesia Preprocedure Evaluation (Addendum)
 Anesthesia Evaluation  Patient identified by MRN, date of birth, ID band Patient awake    Reviewed: Allergy & Precautions, NPO status , Patient's Chart, lab work & pertinent test results  Airway Mallampati: III  TM Distance: >3 FB Neck ROM: Full    Dental no notable dental hx.    Pulmonary sleep apnea and Continuous Positive Airway Pressure Ventilation , former smoker   Pulmonary exam normal        Cardiovascular hypertension, Pt. 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  Reproductive/Obstetrics                              Anesthesia Physical Anesthesia Plan  ASA: 3  Anesthesia Plan: General   Post-op Pain Management:    Induction: Intravenous  PONV Risk Score and Plan: 4 or greater and Ondansetron , Propofol  infusion, Dexamethasone  and Treatment may vary due to age or medical condition  Airway Management Planned: Oral ETT  Additional Equipment:   Intra-op Plan:   Post-operative Plan: Extubation in OR  Informed Consent: I have reviewed the patients History and Physical, chart, labs and discussed the procedure including the risks, benefits and alternatives for the proposed anesthesia with the patient or authorized representative who has indicated his/her understanding and acceptance.       Plan Discussed with: CRNA  Anesthesia Plan Comments: (PAT note from 9/17 )         Anesthesia Quick Evaluation

## 2024-04-27 NOTE — Telephone Encounter (Signed)
 Pt called in checking the status of her form. I let her know that the form was never received when she had emailed it. The patient stress her concerns and frustration. I asked he if she wants to file and complaint and she stated that she did. I was able to give her my Directors number. She also state that she wanted to send the  form again and she repeated the email address to me. Come to find out the email address that she had written down, was incorrect. I gave her the correct emailed address and stay on the phone with the pt until the form came through. I have completed the form and emailed it to Devere BROCKS, RN over at [facility], and stated that the form needed MD signature and if the form can  be given to pt when she comes in today. I called pt LVM to let her know the form was competed,and emailed. No questions or concerns to be noted at this time.

## 2024-04-27 NOTE — Progress Notes (Signed)
  [facility] OFFICE PROGRESS NOTE   Diagnosis: Gynecologic cancer  INTERVAL HISTORY:   Susan Frederick returns as scheduled.  She completed cycle 4 paclitaxel /carboplatin  04/05/2024.  She denies nausea/vomiting.  No mouth sores.  She had a single loose stool 4 to 5 days ago.  This occurred after taking MiraLAX .  Similar bone/joint pain, maybe lasted a little longer.  At nighttime she notices that the toes feel numb and cold.  Abdominal pain continues to be improved.  Objective:  Vital signs in last 24 hours:  Blood pressure (!) 145/72, pulse 89, temperature 97.8 F (36.6 C), temperature source Temporal, resp. rate 18, height 5' 6 (1.676 m), weight 289 lb (131.1 kg), SpO2 100%.    HEENT: No thrush or ulcers. Resp: Lungs clear bilaterally. Cardio: Regular rate and rhythm. GI: Abdomen is soft.  Tender at the left lower abdomen.  No mass. Vascular: No leg edema. Skin: No rash. Port-A-Cath without erythema.  Lab Results:  Lab Results  Component Value Date   WBC 5.3 04/26/2024   HGB 10.4 (L) 04/26/2024   HCT 33.0 (L) 04/26/2024   MCV 99.1 04/26/2024   PLT 174 04/26/2024   NEUTROABS 3.8 04/26/2024    Imaging:  No results found.  Medications: I have reviewed the patient's current medications.  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) 12/27/2023: CT abdomen/pelvis-extensive abnormal diffuse stranding and nodularity of the omentum and mesentery, small volume ascites, small periumbilical hernia with soft tissue nodularity, enlarged aortocaval retroperitoneal node 01/05/2024: PET-hypermetabolic lymph nodes in the mediastinum, bilateral internal mammary, and right pericardial areas, diffuse carcinomatosis, small hypermetabolic retroperitoneal nodes, no primary lesion identified 01/18/2024: CT-guided biopsy of left peritoneal tumor-metastatic high-grade serous carcinoma, PAX8 and WT1 positive Elevated CA125 Cycle 1 paclitaxel /carboplatin  02/02/2024,  [facility]  Cycle 2 paclitaxel /carboplatin  02/23/2024, [facility]  Cycle 3 paclitaxel /carboplatin  03/15/2024, chemotherapy dose reduced due to thrombocytopenia and arthralgias; [facility]  03/28/2024 CTs: No enlarged mediastinal nodes, no ascites, mild diffuse peritoneal thickening and nodularity-significantly improved, low-attenuation anterior liver lesion unchanged, new 3 mm left lower lobe nodule Cycle 4 paclitaxel /carboplatin  04/05/2024, [facility]den[facility]  Surgery 05/02/2024   Abdominal pain secondary to 1 Anorexia/weight loss secondary to #1 Hypertension Arthritis Benign endocervix polyp 2019 Family history of breast, endometrial, lung, and head neck cancer-negative genetic testing 02/17/2024  Disposition: Susan Frederick appears stable.  She has completed 4 cycles of paclitaxel /carboplatin .  She is scheduled for debulking surgery 05/02/2024.  She will return for a follow-up visit here 3 to 4 weeks after surgery.  We are available to see her sooner if needed.  Plan reviewed with Dr. Cloretta.    Olam Ned ANP/GNP-BC   04/27/2024  1:56 PM

## 2024-04-28 LAB — CA 125: Cancer Antigen (CA) 125: 33.2 U/mL (ref 0.0–38.1)

## 2024-05-01 ENCOUNTER — Telehealth: Payer: Self-pay | Admitting: *Deleted

## 2024-05-01 NOTE — Telephone Encounter (Signed)
 Telephone call to check on pre-operative status.  Patient compliant with pre-operative instructions and bowel prep instructions. Reinforced  Clear liquids only until 0415 on 9/23. Patient to arrive at 0515.  No questions or concerns voiced.  Instructed to call for any needs.

## 2024-05-02 ENCOUNTER — Inpatient Hospital Stay (HOSPITAL_COMMUNITY)
Admission: RE | Admit: 2024-05-02 | Discharge: 2024-05-03 | DRG: 740 | Disposition: A | Discharge status: Home / Self Care | Attending: Obstetrics & Gynecology | Admitting: Obstetrics & Gynecology

## 2024-05-02 ENCOUNTER — Encounter (HOSPITAL_COMMUNITY): Payer: Self-pay | Admitting: Psychiatry

## 2024-05-02 ENCOUNTER — Inpatient Hospital Stay (HOSPITAL_COMMUNITY): Payer: Self-pay | Admitting: Anesthesiology

## 2024-05-02 ENCOUNTER — Encounter (HOSPITAL_COMMUNITY)
Admission: RE | Disposition: A | Discharge status: Home / Self Care | Payer: Self-pay | Source: Home / Self Care | Attending: Psychiatry

## 2024-05-02 ENCOUNTER — Inpatient Hospital Stay (HOSPITAL_COMMUNITY): Payer: Self-pay | Admitting: Medical

## 2024-05-02 ENCOUNTER — Other Ambulatory Visit: Payer: Self-pay

## 2024-05-02 DIAGNOSIS — Z801 Family history of malignant neoplasm of trachea, bronchus and lung: Secondary | ICD-10-CM | POA: Diagnosis not present

## 2024-05-02 DIAGNOSIS — N838 Other noninflammatory disorders of ovary, fallopian tube and broad ligament: Secondary | ICD-10-CM | POA: Diagnosis present

## 2024-05-02 DIAGNOSIS — E66813 Obesity, class 3: Secondary | ICD-10-CM | POA: Diagnosis present

## 2024-05-02 DIAGNOSIS — C579 Malignant neoplasm of female genital organ, unspecified: Secondary | ICD-10-CM | POA: Diagnosis present

## 2024-05-02 DIAGNOSIS — Z87891 Personal history of nicotine dependence: Secondary | ICD-10-CM

## 2024-05-02 DIAGNOSIS — H5711 Ocular pain, right eye: Secondary | ICD-10-CM | POA: Diagnosis not present

## 2024-05-02 DIAGNOSIS — G4733 Obstructive sleep apnea (adult) (pediatric): Secondary | ICD-10-CM | POA: Diagnosis present

## 2024-05-02 DIAGNOSIS — K660 Peritoneal adhesions (postprocedural) (postinfection): Secondary | ICD-10-CM | POA: Diagnosis present

## 2024-05-02 DIAGNOSIS — C541 Malignant neoplasm of endometrium: Secondary | ICD-10-CM | POA: Diagnosis present

## 2024-05-02 DIAGNOSIS — Z6841 Body Mass Index (BMI) 40.0 and over, adult: Secondary | ICD-10-CM

## 2024-05-02 DIAGNOSIS — Z981 Arthrodesis status: Secondary | ICD-10-CM

## 2024-05-02 DIAGNOSIS — Z79899 Other long term (current) drug therapy: Secondary | ICD-10-CM | POA: Diagnosis not present

## 2024-05-02 DIAGNOSIS — Z9221 Personal history of antineoplastic chemotherapy: Secondary | ICD-10-CM

## 2024-05-02 DIAGNOSIS — Z8049 Family history of malignant neoplasm of other genital organs: Secondary | ICD-10-CM

## 2024-05-02 DIAGNOSIS — C5702 Malignant neoplasm of left fallopian tube: Secondary | ICD-10-CM | POA: Diagnosis present

## 2024-05-02 DIAGNOSIS — C786 Secondary malignant neoplasm of retroperitoneum and peritoneum: Secondary | ICD-10-CM | POA: Diagnosis present

## 2024-05-02 DIAGNOSIS — Z808 Family history of malignant neoplasm of other organs or systems: Secondary | ICD-10-CM

## 2024-05-02 DIAGNOSIS — I10 Essential (primary) hypertension: Secondary | ICD-10-CM | POA: Diagnosis present

## 2024-05-02 DIAGNOSIS — H538 Other visual disturbances: Secondary | ICD-10-CM | POA: Diagnosis not present

## 2024-05-02 DIAGNOSIS — Z803 Family history of malignant neoplasm of breast: Secondary | ICD-10-CM

## 2024-05-02 DIAGNOSIS — C482 Malignant neoplasm of peritoneum, unspecified: Secondary | ICD-10-CM | POA: Diagnosis present

## 2024-05-02 HISTORY — PX: CYSTOSCOPY: SHX5120

## 2024-05-02 HISTORY — PX: HYSTERECTOMY,ROBOT,W/OMENTECT/SALPINGO-OOPHORECT FOR MALIGNANT NEOPLASM: SHX7717

## 2024-05-02 LAB — ABO/RH: ABO/RH(D): O POS

## 2024-05-02 LAB — TYPE AND SCREEN
ABO/RH(D): O POS
Antibody Screen: NEGATIVE

## 2024-05-02 SURGERY — HYSTERECTOMY, TOTAL, ROBOT-ASSISTED, WITH OMENTECTOMY AND SALPINGO-OOPHORECTOMY FOR MALIGNANT NEOPLASM
Anesthesia: General | Site: Bladder

## 2024-05-02 MED ORDER — GLYCOPYRROLATE 0.2 MG/ML IJ SOLN
INTRAMUSCULAR | Status: AC
  Filled 2024-05-02: qty 1

## 2024-05-02 MED ORDER — SUCCINYLCHOLINE CHLORIDE 200 MG/10ML IV SOSY
PREFILLED_SYRINGE | INTRAVENOUS | Status: AC
  Filled 2024-05-02: qty 10

## 2024-05-02 MED ORDER — FENTANYL CITRATE PF 50 MCG/ML IJ SOSY
25.0000 ug | PREFILLED_SYRINGE | INTRAMUSCULAR | Status: DC | PRN
  Administered 2024-05-02 (×3): 50 ug via INTRAVENOUS

## 2024-05-02 MED ORDER — ACETAMINOPHEN 500 MG PO TABS
1000.0000 mg | ORAL_TABLET | Freq: Two times a day (BID) | ORAL | Status: DC
  Administered 2024-05-02 – 2024-05-03 (×2): 1000 mg via ORAL
  Filled 2024-05-02 (×2): qty 2

## 2024-05-02 MED ORDER — TRAMADOL HCL 50 MG PO TABS: 100.0000 mg | ORAL_TABLET | Freq: Two times a day (BID) | ORAL | Status: DC | PRN

## 2024-05-02 MED ORDER — SENNOSIDES-DOCUSATE SODIUM 8.6-50 MG PO TABS
2.0000 | ORAL_TABLET | Freq: Every day | ORAL | Status: DC
  Administered 2024-05-03: 2 via ORAL
  Filled 2024-05-02 (×2): qty 2

## 2024-05-02 MED ORDER — LACTATED RINGERS IV SOLN
INTRAVENOUS | Status: DC
Start: 2024-05-02 — End: 2024-05-02

## 2024-05-02 MED ORDER — BUPIVACAINE LIPOSOME 1.3 % IJ SUSP
INTRAMUSCULAR | Status: AC
  Filled 2024-05-02: qty 20

## 2024-05-02 MED ORDER — SUGAMMADEX SODIUM 200 MG/2ML IV SOLN
INTRAVENOUS | Status: AC
Start: 2024-05-02 — End: 2024-05-02
  Filled 2024-05-02: qty 2

## 2024-05-02 MED ORDER — SODIUM CHLORIDE (PF) 0.9 % IJ SOLN
INTRAMUSCULAR | Status: DC | PRN
  Administered 2024-05-02: 20 mL

## 2024-05-02 MED ORDER — FENTANYL CITRATE (PF) 100 MCG/2ML IJ SOLN
INTRAMUSCULAR | Status: AC
  Filled 2024-05-02: qty 2

## 2024-05-02 MED ORDER — KETOROLAC TROMETHAMINE 0.5 % OP SOLN
1.0000 [drp] | Freq: Four times a day (QID) | OPHTHALMIC | Status: DC
  Administered 2024-05-02: 1 [drp] via OPHTHALMIC
  Filled 2024-05-02: qty 5

## 2024-05-02 MED ORDER — BSS IO SOLN
15.0000 mL | Freq: Once | INTRAOCULAR | Status: DC
  Filled 2024-05-02: qty 15

## 2024-05-02 MED ORDER — MIDAZOLAM HCL 5 MG/5ML IJ SOLN
INTRAMUSCULAR | Status: DC | PRN
  Administered 2024-05-02: 1 mg via INTRAVENOUS

## 2024-05-02 MED ORDER — ONDANSETRON HCL 4 MG PO TABS: 4.0000 mg | ORAL_TABLET | Freq: Four times a day (QID) | ORAL | Status: DC | PRN

## 2024-05-02 MED ORDER — ROCURONIUM BROMIDE 10 MG/ML (PF) SYRINGE
PREFILLED_SYRINGE | INTRAVENOUS | Status: AC
  Filled 2024-05-02: qty 10

## 2024-05-02 MED ORDER — LIDOCAINE HCL (CARDIAC) PF 100 MG/5ML IV SOSY
PREFILLED_SYRINGE | INTRAVENOUS | Status: DC | PRN
  Administered 2024-05-02: 60 mg via INTRAVENOUS

## 2024-05-02 MED ORDER — EPHEDRINE SULFATE-NACL 50-0.9 MG/10ML-% IV SOSY
PREFILLED_SYRINGE | INTRAVENOUS | Status: DC | PRN
Start: 2024-05-02 — End: 2024-05-02
  Administered 2024-05-02 (×3): 5 mg via INTRAVENOUS

## 2024-05-02 MED ORDER — DEXAMETHASONE SODIUM PHOSPHATE 10 MG/ML IJ SOLN: 4.0000 mg | INTRAMUSCULAR | Status: DC

## 2024-05-02 MED ORDER — CEFAZOLIN SODIUM 1 G IJ SOLR
INTRAMUSCULAR | Status: AC
  Filled 2024-05-02: qty 20

## 2024-05-02 MED ORDER — EPHEDRINE 5 MG/ML INJ
INTRAVENOUS | Status: AC
  Filled 2024-05-02: qty 5

## 2024-05-02 MED ORDER — BUPIVACAINE HCL (PF) 0.25 % IJ SOLN
INTRAMUSCULAR | Status: AC
  Filled 2024-05-02: qty 30

## 2024-05-02 MED ORDER — SODIUM CHLORIDE (PF) 0.9 % IJ SOLN
INTRAMUSCULAR | Status: AC
  Filled 2024-05-02: qty 20

## 2024-05-02 MED ORDER — ONDANSETRON HCL 4 MG/2ML IJ SOLN
INTRAMUSCULAR | Status: AC
  Filled 2024-05-02: qty 2

## 2024-05-02 MED ORDER — ACETAMINOPHEN 500 MG PO TABS
1000.0000 mg | ORAL_TABLET | ORAL | Status: AC
  Administered 2024-05-02: 1000 mg via ORAL
  Filled 2024-05-02: qty 2

## 2024-05-02 MED ORDER — OXYCODONE HCL 5 MG PO TABS
5.0000 mg | ORAL_TABLET | ORAL | Status: DC | PRN
  Administered 2024-05-02 – 2024-05-03 (×3): 5 mg via ORAL
  Filled 2024-05-02 (×3): qty 1

## 2024-05-02 MED ORDER — GLYCOPYRROLATE 0.2 MG/ML IJ SOLN
INTRAMUSCULAR | Status: DC | PRN
  Administered 2024-05-02: .1 mg via INTRAVENOUS

## 2024-05-02 MED ORDER — POVIDONE-IODINE 10 % EX SWAB: 2.0000 | Freq: Once | CUTANEOUS | Status: DC

## 2024-05-02 MED ORDER — AMISULPRIDE (ANTIEMETIC) 5 MG/2ML IV SOLN: 10.0000 mg | Freq: Once | INTRAVENOUS | Status: DC | PRN

## 2024-05-02 MED ORDER — PROPOFOL 10 MG/ML IV BOLUS
INTRAVENOUS | Status: AC
  Filled 2024-05-02: qty 20

## 2024-05-02 MED ORDER — BUPIVACAINE LIPOSOME 1.3 % IJ SUSP
INTRAMUSCULAR | Status: DC | PRN
  Administered 2024-05-02: 20 mL

## 2024-05-02 MED ORDER — MIDAZOLAM HCL 2 MG/2ML IJ SOLN
INTRAMUSCULAR | Status: AC
  Filled 2024-05-02: qty 2

## 2024-05-02 MED ORDER — SUGAMMADEX SODIUM 200 MG/2ML IV SOLN
INTRAVENOUS | Status: AC
  Filled 2024-05-02: qty 2

## 2024-05-02 MED ORDER — CHLORHEXIDINE GLUCONATE 0.12 % MT SOLN
15.0000 mL | Freq: Once | OROMUCOSAL | Status: AC
  Administered 2024-05-02: 15 mL via OROMUCOSAL

## 2024-05-02 MED ORDER — IBUPROFEN 400 MG PO TABS
600.0000 mg | ORAL_TABLET | Freq: Four times a day (QID) | ORAL | Status: DC
  Administered 2024-05-03: 600 mg via ORAL
  Filled 2024-05-02: qty 1

## 2024-05-02 MED ORDER — ENOXAPARIN SODIUM 40 MG/0.4ML IJ SOSY: 40.0000 mg | PREFILLED_SYRINGE | INTRAMUSCULAR | Status: DC

## 2024-05-02 MED ORDER — LACTATED RINGERS IR SOLN
Status: DC | PRN
  Administered 2024-05-02: 1000 mL

## 2024-05-02 MED ORDER — ORAL CARE MOUTH RINSE: 15.0000 mL | Freq: Once | OROMUCOSAL | Status: AC

## 2024-05-02 MED ORDER — BUPIVACAINE HCL 0.25 % IJ SOLN
INTRAMUSCULAR | Status: DC | PRN
  Administered 2024-05-02: 30 mL

## 2024-05-02 MED ORDER — HYDROMORPHONE HCL 1 MG/ML IJ SOLN: 0.5000 mg | INTRAMUSCULAR | Status: DC | PRN

## 2024-05-02 MED ORDER — STERILE WATER FOR IRRIGATION IR SOLN
Status: DC | PRN
  Administered 2024-05-02: 1000 mL

## 2024-05-02 MED ORDER — METRONIDAZOLE 500 MG/100ML IV SOLN
500.0000 mg | INTRAVENOUS | Status: AC
  Administered 2024-05-02: 500 mg via INTRAVENOUS
  Filled 2024-05-02: qty 100

## 2024-05-02 MED ORDER — LIDOCAINE HCL (PF) 2 % IJ SOLN
INTRAMUSCULAR | Status: AC
  Filled 2024-05-02: qty 5

## 2024-05-02 MED ORDER — 0.9 % SODIUM CHLORIDE (POUR BTL) OPTIME
TOPICAL | Status: DC | PRN
  Administered 2024-05-02: 1000 mL

## 2024-05-02 MED ORDER — FENTANYL CITRATE (PF) 100 MCG/2ML IJ SOLN
INTRAMUSCULAR | Status: DC | PRN
  Administered 2024-05-02: 100 ug via INTRAVENOUS
  Administered 2024-05-02: 50 ug via INTRAVENOUS
  Administered 2024-05-02 (×2): 25 ug via INTRAVENOUS

## 2024-05-02 MED ORDER — ONDANSETRON HCL 4 MG/2ML IJ SOLN: 4.0000 mg | Freq: Four times a day (QID) | INTRAMUSCULAR | Status: DC | PRN

## 2024-05-02 MED ORDER — ONDANSETRON HCL 4 MG/2ML IJ SOLN: 4.0000 mg | Freq: Once | INTRAMUSCULAR | Status: DC | PRN

## 2024-05-02 MED ORDER — FENTANYL CITRATE PF 50 MCG/ML IJ SOSY
PREFILLED_SYRINGE | INTRAMUSCULAR | Status: AC
  Filled 2024-05-02: qty 1

## 2024-05-02 MED ORDER — SUGAMMADEX SODIUM 200 MG/2ML IV SOLN
INTRAVENOUS | Status: DC | PRN
  Administered 2024-05-02: 200 mg via INTRAVENOUS

## 2024-05-02 MED ORDER — PHENYLEPHRINE 80 MCG/ML (10ML) SYRINGE FOR IV PUSH (FOR BLOOD PRESSURE SUPPORT)
PREFILLED_SYRINGE | INTRAVENOUS | Status: AC
  Filled 2024-05-02: qty 10

## 2024-05-02 MED ORDER — HEPARIN SODIUM (PORCINE) 5000 UNIT/ML IJ SOLN
5000.0000 [IU] | INTRAMUSCULAR | Status: AC
  Administered 2024-05-02: 5000 [IU] via SUBCUTANEOUS
  Filled 2024-05-02: qty 1

## 2024-05-02 MED ORDER — PROPOFOL 10 MG/ML IV BOLUS
INTRAVENOUS | Status: DC | PRN
  Administered 2024-05-02: 200 mg via INTRAVENOUS

## 2024-05-02 MED ORDER — SODIUM CHLORIDE 0.45 % IV SOLN: INTRAVENOUS | Status: DC

## 2024-05-02 MED ORDER — FENTANYL CITRATE PF 50 MCG/ML IJ SOSY
PREFILLED_SYRINGE | INTRAMUSCULAR | Status: AC
  Filled 2024-05-02: qty 2

## 2024-05-02 MED ORDER — DEXAMETHASONE SODIUM PHOSPHATE 10 MG/ML IJ SOLN
INTRAMUSCULAR | Status: AC
  Filled 2024-05-02: qty 1

## 2024-05-02 MED ORDER — SERTRALINE HCL 50 MG PO TABS
50.0000 mg | ORAL_TABLET | Freq: Every day | ORAL | Status: DC
  Administered 2024-05-02: 50 mg via ORAL
  Filled 2024-05-02: qty 1

## 2024-05-02 MED ORDER — DEXAMETHASONE SODIUM PHOSPHATE 10 MG/ML IJ SOLN
INTRAMUSCULAR | Status: DC | PRN
  Administered 2024-05-02: 6 mg via INTRAVENOUS

## 2024-05-02 MED ORDER — ROCURONIUM BROMIDE 100 MG/10ML IV SOLN
INTRAVENOUS | Status: DC | PRN
  Administered 2024-05-02: 60 mg via INTRAVENOUS
  Administered 2024-05-02: 15 mg via INTRAVENOUS
  Administered 2024-05-02: 10 mg via INTRAVENOUS

## 2024-05-02 MED ORDER — CEFAZOLIN SODIUM-DEXTROSE 3-4 GM/150ML-% IV SOLN
3.0000 g | INTRAVENOUS | Status: AC
  Administered 2024-05-02: 3 g via INTRAVENOUS
  Administered 2024-05-02: 2 g via INTRAVENOUS
  Filled 2024-05-02: qty 150

## 2024-05-02 MED ORDER — ONDANSETRON HCL 4 MG/2ML IJ SOLN
INTRAMUSCULAR | Status: DC | PRN
  Administered 2024-05-02: 4 mg via INTRAVENOUS

## 2024-05-02 SURGICAL SUPPLY — 91 items
ATTRACTOMAT 16X20 MAGNETIC DRP (DRAPES) IMPLANT
BAG COUNTER SPONGE SURGICOUNT (BAG) IMPLANT
BLADE EXTENDED COATED 6.5IN (ELECTRODE) ×3 IMPLANT
BLADE SURG SZ10 CARB STEEL (BLADE) ×1 IMPLANT
CELLS DAT CNTRL 66122 CELL SVR (MISCELLANEOUS) IMPLANT
CHLORAPREP W/TINT 26 (MISCELLANEOUS) ×3 IMPLANT
CLIP TI LARGE 6 (CLIP) ×3 IMPLANT
CLIP TI MEDIUM 6 (CLIP) ×3 IMPLANT
CLIP TI MEDIUM LARGE 6 (CLIP) ×3 IMPLANT
CNTNR URN SCR LID CUP LEK RST (MISCELLANEOUS) IMPLANT
COVER BACK TABLE 60X90IN (DRAPES) ×1 IMPLANT
COVER SURGICAL LIGHT HANDLE (MISCELLANEOUS) ×3 IMPLANT
COVER TIP SHEARS 8 DVNC (MISCELLANEOUS) ×1 IMPLANT
DERMABOND ADVANCED .7 DNX12 (GAUZE/BANDAGES/DRESSINGS) ×1 IMPLANT
DRAPE ARM DVNC X/XI (DISPOSABLE) ×4 IMPLANT
DRAPE COLUMN DVNC XI (DISPOSABLE) ×1 IMPLANT
DRAPE INCISE IOBAN 66X45 STRL (DRAPES) IMPLANT
DRAPE SHEET LG 3/4 BI-LAMINATE (DRAPES) ×1 IMPLANT
DRAPE SURG IRRIG POUCH 19X23 (DRAPES) ×5 IMPLANT
DRAPE WARM FLUID 44X44 (DRAPES) ×3 IMPLANT
DRIVER NDL MEGA SUTCUT DVNCXI (INSTRUMENTS) IMPLANT
DRIVER NDLE MEGA SUTCUT DVNCXI (INSTRUMENTS) ×3 IMPLANT
DRSG OPSITE POSTOP 4X10 (GAUZE/BANDAGES/DRESSINGS) IMPLANT
DRSG OPSITE POSTOP 4X6 (GAUZE/BANDAGES/DRESSINGS) ×1 IMPLANT
DRSG OPSITE POSTOP 4X8 (GAUZE/BANDAGES/DRESSINGS) IMPLANT
ELECT PENCIL ROCKER SW 15FT (MISCELLANEOUS) ×1 IMPLANT
ELECT REM PT RETURN 15FT ADLT (MISCELLANEOUS) ×4 IMPLANT
FORCEPS BPLR FENES DVNC XI (FORCEP) ×1 IMPLANT
FORCEPS PROGRASP DVNC XI (FORCEP) ×1 IMPLANT
GAUZE 4X4 16PLY ~~LOC~~+RFID DBL (SPONGE) ×1 IMPLANT
GLOVE BIO SURGEON STRL SZ 6.5 (GLOVE) ×4 IMPLANT
GLOVE BIOGEL PI IND STRL 6.5 (GLOVE) ×5 IMPLANT
GLOVE BIOGEL PI MICRO STRL 6 (GLOVE) ×16 IMPLANT
GOWN STRL REUS W/ TWL LRG LVL3 (GOWN DISPOSABLE) ×10 IMPLANT
HEMOSTAT ARISTA ABSORB 3G PWDR (HEMOSTASIS) IMPLANT
IRRIGATION SUCT STRKRFLW 2 WTP (MISCELLANEOUS) ×1 IMPLANT
KIT BASIN OR (CUSTOM PROCEDURE TRAY) ×3 IMPLANT
KIT TURNOVER KIT A (KITS) ×4 IMPLANT
LIGASURE IMPACT 36 18CM CVD LR (INSTRUMENTS) ×1 IMPLANT
LOOP VESSEL MAXI BLUE (MISCELLANEOUS) IMPLANT
MANIPULATOR ADVINCU DEL 3.0 PL (MISCELLANEOUS) ×1 IMPLANT
NDL HYPO 21X1.5 SAFETY (NEEDLE) ×6 IMPLANT
NEEDLE HYPO 21X1.5 SAFETY (NEEDLE) ×3 IMPLANT
NS IRRIG 1000ML POUR BTL (IV SOLUTION) ×6 IMPLANT
OBTURATOR OPTICALSTD 8 DVNC (TROCAR) ×1 IMPLANT
PACK GENERAL/GYN (CUSTOM PROCEDURE TRAY) ×3 IMPLANT
PACK ROBOT GYN CUSTOM WL (TRAY / TRAY PROCEDURE) ×1 IMPLANT
PAD ARMBOARD POSITIONER FOAM (MISCELLANEOUS) ×1 IMPLANT
PAD POSITIONING PINK XL (MISCELLANEOUS) ×1 IMPLANT
RELOAD STAPLE 60 BLU REG PROX (ENDOMECHANICALS) IMPLANT
RELOAD STAPLE 75 3.8 BLU REG (ENDOMECHANICALS) IMPLANT
RETRACTOR WND ALEXIS 18 MED (MISCELLANEOUS) IMPLANT
RETRACTOR WND ALEXIS 25 LRG (MISCELLANEOUS) IMPLANT
RETRACTOR WOUND ALXS 19CM XSML (INSTRUMENTS) ×1 IMPLANT
SCISSORS MNPLR CVD DVNC XI (INSTRUMENTS) ×1 IMPLANT
SCRUB CHG 4% DYNA-HEX 4OZ (MISCELLANEOUS) ×2 IMPLANT
SEAL UNIV 5-12 XI (MISCELLANEOUS) ×4 IMPLANT
SET TRI-LUMEN FLTR TB AIRSEAL (TUBING) ×1 IMPLANT
SHEET LAVH (DRAPES) ×3 IMPLANT
SLEEVE SUCTION CATH 165 (SLEEVE) ×3 IMPLANT
SOL PREP POV-IOD 4OZ 10% (MISCELLANEOUS) ×3 IMPLANT
SPONGE T-LAP 18X18 ~~LOC~~+RFID (SPONGE) ×3 IMPLANT
STAPLER GUN LINEAR PROX 60 (STAPLE) IMPLANT
STAPLER PROXIMATE 75MM BLUE (STAPLE) IMPLANT
STAPLER SKIN PROX 35W (STAPLE) IMPLANT
SURGIFLO W/THROMBIN 8M KIT (HEMOSTASIS) IMPLANT
SUT MNCRL AB 4-0 PS2 18 (SUTURE) ×6 IMPLANT
SUT PDS AB 1 TP1 54 (SUTURE) ×8 IMPLANT
SUT PDS AB 1 TP1 96 (SUTURE) IMPLANT
SUT SILK 3 0 SH CR/8 (SUTURE) IMPLANT
SUT VIC AB 0 CT1 36 (SUTURE) ×12 IMPLANT
SUT VIC AB 2-0 CT1 36 (SUTURE) ×6 IMPLANT
SUT VIC AB 2-0 CT1 TAPERPNT 27 (SUTURE) ×6 IMPLANT
SUT VIC AB 2-0 CT2 27 (SUTURE) IMPLANT
SUT VIC AB 2-0 SH 18 (SUTURE) IMPLANT
SUT VIC AB 2-0 SH 27X BRD (SUTURE) ×1 IMPLANT
SUT VIC AB 3-0 CTX 36 (SUTURE) IMPLANT
SUT VIC AB 3-0 SH 18 (SUTURE) IMPLANT
SUT VIC AB 3-0 SH 27X BRD (SUTURE) ×3 IMPLANT
SUT VIC AB 3-0 SH 27XBRD (SUTURE) ×1 IMPLANT
SUT VIC AB 4-0 PS2 18 (SUTURE) ×2 IMPLANT
SUT VIC AB 4-0 PS2 27 (SUTURE) ×1 IMPLANT
SUT VICRYL 0 27 CT2 27 ABS (SUTURE) ×1 IMPLANT
SYR 30ML LL (SYRINGE) ×6 IMPLANT
SYR BULB IRRIG 60ML STRL (SYRINGE) ×1 IMPLANT
TOWEL OR 17X26 10 PK STRL BLUE (TOWEL DISPOSABLE) ×3 IMPLANT
TRAY FOLEY MTR SLVR 16FR STAT (SET/KITS/TRAYS/PACK) ×4 IMPLANT
TROCAR XCEL NON-BLD 5MMX100MML (ENDOMECHANICALS) ×1 IMPLANT
UNDERPAD 30X36 HEAVY ABSORB (UNDERPADS AND DIAPERS) ×5 IMPLANT
WATER STERILE IRR 1000ML POUR (IV SOLUTION) ×1 IMPLANT
YANKAUER SUCT BULB TIP 10FT TU (MISCELLANEOUS) ×1 IMPLANT

## 2024-05-02 NOTE — Transfer of Care (Signed)
 Immediate Anesthesia Transfer of Care Note  Patient: Susan Frederick  Procedure(s) Performed: ROBOTIC ASSISTED TOTAL LAPAROSCOPIC HYSTERECTOMY WITH BILATERAL SALPINGO-OOPHORECTOMY, MINILAPAROTOMY FOR OMENTECTOMY, PERITONEAL STRIPPING FOR DEBULKING (Bilateral: Abdomen) CYSTOSCOPY (Bladder)  Patient Location: PACU  Anesthesia Type:General  Level of Consciousness: awake, alert , oriented, and patient cooperative  Airway & Oxygen Therapy: Patient Spontanous Breathing and Patient connected to face mask oxygen  Post-op Assessment: Report given to RN and Post -op Vital signs reviewed and stable  Post vital signs: Reviewed and stable  Last Vitals:  Vitals Value Taken Time  BP 125/68 05/02/24 12:47  Temp    Pulse 57 05/02/24 12:54  Resp 19 05/02/24 12:55  SpO2 100 % 05/02/24 12:54  Vitals shown include unfiled device data.  Last Pain:  Vitals:   05/02/24 1252  TempSrc:   PainSc: 10-Worst pain ever         Complications: There were no known notable events for this encounter.

## 2024-05-02 NOTE — Consult Note (Signed)
 WOC consulted for preoperative marking. Patient was marked by ostomy RN 04/26/2024. WOC team will follow for postop ostomy needs.   Thank you,    Powell Bar MSN, RN-BC, [employer]

## 2024-05-02 NOTE — Interval H&P Note (Signed)
 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  The various methods of treatment have been discussed with the patient and family. After consideration of risks, benefits and other options for treatment, the patient has consented to  Procedure(s) with comments: HYSTERECTOMY, TOTAL, ROBOT-ASSISTED, WITH OMENTECTOMY AND SALPINGO-OOPHORECTOMY FOR MALIGNANT NEOPLASM (N/A) - ROBOTIC ASSISTED WITH DEBULKING LAPAROTOMY, EXPLORATORY (N/A) COLECTOMY, SIGMOID, OPEN (N/A) CREATION, ILEOSTOMY (N/A) as a surgical intervention.  The patient's history has been reviewed, patient examined, no change in status, stable for surgery.  I have reviewed the patient's chart and labs.  Questions were answered to the patient's satisfaction.     Johann Gascoigne

## 2024-05-02 NOTE — Anesthesia Procedure Notes (Signed)
 Procedure Name: Intubation Date/Time: 05/02/2024 7:51 AM  Performed by: Erick Fitz, CRNAPre-anesthesia Checklist: Patient identified, Emergency Drugs available, Suction available, Patient being monitored and Timeout performed Patient Re-evaluated:Patient Re-evaluated prior to induction Oxygen Delivery Method: Circle system utilized Preoxygenation: Pre-oxygenation with 100% oxygen Induction Type: IV induction Ventilation: Mask ventilation without difficulty Laryngoscope Size: Mac and 3 Grade View: Grade II Tube type: Oral Tube size: 7.0 mm Number of attempts: 1 Airway Equipment and Method: Stylet Placement Confirmation: ETT inserted through vocal cords under direct vision, positive ETCO2, CO2 detector and breath sounds checked- equal and bilateral Secured at: 23 cm Tube secured with: Tape (secured with pink Hy-tape) Dental Injury: Teeth and Oropharynx as per pre-operative assessment

## 2024-05-02 NOTE — Plan of Care (Signed)

## 2024-05-02 NOTE — Op Note (Addendum)
 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  Postoperative Diagnosis: Same  Procedures: Robotic-assisted total laparoscopic hysterectomy, bilateral salpingo-oophorectomy, radical dissection for tumor debulking with peritoneal stripping, mini laparotomy for omentectomy, cystoscopy  Surgeon: Hoy Masters, MD  Assistants: Olam Mill, MD and (an MD assistant was necessary for tissue manipulation, management of robotic instrumentation, retraction and positioning due to the complexity of the case and hospital policies)  Anesthesia: General  Estimated Blood Loss: 200 mL    Fluids: 1900 ml, crystalloid  Urine Output: 175 ml, clear yellow  Findings: On bimanual exam, small mobile uterus.  No palpable adnexal mass.  Suspected perforation with placement of uterine manipulator.  On laparoscopic entry, overall excellent response to therapy.  Areas of apparent treated disease on bilateral diaphragm.  Minimal apparent disease of omentum.  No evidence of disease on the bowel.  Miliary areas of mostly treated disease and possible small residual disease on small bowel mesentery.  In the pelvis, small normal-appearing uterus.  Posterior uterine wall perforation without bleeding.  Mildly dilated bilateral fallopian tubes.  No obvious adnexal mass.  Adhesions of the colon to the left pelvic sidewall and the left adnexa.  Bladder adherent to the anterior uterus.  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  Apparent treated disease along the posterior cul-de-sac peritoneum.  Cystoscopy with intact bladder and no evidence of injury.  Bilateral ureteral orifices identified with jets bilaterally.  Overall, nearly complete gross resection with possible few scattered areas of miliary disease (<70mm) on bowel mesentery and right diaphragm.  Specimens:  ID Type Source Tests Collected by Time Destination  1 :  uterus, cervix, bilateral tubes and ovaries Tissue PATH Gyn biopsy SURGICAL PATHOLOGY Masters Hoy, MD 05/02/2024 [phone]   2 : Bladder peritoneum Tissue Path Tissue SURGICAL PATHOLOGY Masters Hoy, MD 05/02/2024 1015   3 : Right inferior pericolic gutter peritoneum Tissue Path Tissue SURGICAL PATHOLOGY Masters Hoy, MD 05/02/2024 1016   4 : Left inferior pericolic gutter peritoneum Tissue Path Tissue SURGICAL PATHOLOGY Masters Hoy, MD 05/02/2024 1017   5 : omentum Tissue Path Tissue SURGICAL PATHOLOGY Masters Hoy, MD 05/02/2024 1125   6 : jejunal nodule Tissue Path Tissue SURGICAL PATHOLOGY Masters Hoy, MD 05/02/2024 1138     Complications:  None  Indications for Procedure: Susan Frederick is a 73 y.o. woman with high-grade serous carcinoma of likely GYN origin who is status post neoadjuvant chemotherapy and presents for interval debulking surgery.  Prior to the procedure, all risks, benefits, and alternatives were discussed and informed surgical consent was signed.  Procedure: Patient was taken to the operating room where general anesthesia was achieved.  She was positioned in dorsal lithotomy and prepped and draped.  A foley catheter was inserted into the bladder. The cervix was dilated and an Advincula uterine manipulator with a colpotomy ring was inserted into the uterus.  Perforation was suspected at this time.  A 12 mm incision was made in the left upper quadrant near Palmer's point.  The abdomen was entered with a 5 mm OptiView trocar under direct visualization.  The abdomen was insufflated, the patient placed in steep Trendelenburg, and additional trocars were placed as follows: an 8mm trocar superior to the umbilicus, two 8 mm robotic trocars in the right abdomen, and one 8 mm robotic trocar in the left abdomen.  The left upper quadrant trocar was removed and replaced with a 12 mm airseal trocar.  All trocars  were placed under direct visualization.  The bowels were moved into  the upper abdomen.  The DaVinci robotic surgical system was brought to the patient's bedside and docked.  Uterine perforation was noted through the posterior wall of the uterus without concern for active bleeding.  The right round ligament was transected and the retroperitoneum entered.  The right ureter was identified. The right infundibulopelvic ligament was isolated, cauterized, and transected. The posterior peritoneum was opened to the KOH ring, incorporating an area of the right posterior peritoneum near the ovarian fossa to stripped some of the posterior cul-de-sac peritoneum with possible residual small volume disease.  The anterior peritoneum was opened and the bladder flap was initiated.  Given likely residual miliary disease on the bladder serosa, a portion of the bladder peritoneum was resected to be incorporated with the uterine specimen with plan for additional peritoneal stripping following hysterectomy.  At this time, attention was turned to the left.  Effusions of the colon to the left pelvic sidewall and the left adnexa were lysed sharply.  The left round ligament was transected and the retroperitoneum entered.  The left ureter was identified.  The left infundibulopelvic ligament was isolated, cauterized, and transected.  The posterior peritoneum was opened to the KOH ring.  The anterior peritoneum was opened and additional dissection of the bladder from the anterior uterus was performed.  The bladder was noted to be somewhat adherent to the anterior uterus.  A portion of the bladder serosa was removed from this side as well and incorporated into the uterine specimen.  The left uterine artery was skeletonized, cauterized, and transected at the level of the KOH ring. Additional cautery was used in a C-shaped fashion to allow the remainder of the broad, cardinal, and uterosacral ligaments with the uterine vessels to be transected and fall away from the KOH ring. A similar procedure was performed  on the right side.  A colpotomy was made circumferentially following the contours of the KOH ring.  The uterine specimen was removed through the vagina.    The vaginal cuff was closed with a running stitch of 0 Vicryl suture.  The pelvis was irrigated and all operative sites were found to be hemostatic.  At this time, electrocautery was used to incise the remainder of the bladder peritoneum, incorporating all areas of concern for possible residual active disease.  Using combination of blunt dissection and short burst of electrocautery, the peritoneum was stripped off the bladder.  Two additional sections of peritoneal stripping were performed on the bilateral paracolic gutters at the inferior portion near the junction with the pelvic brim.  At this time, the pelvis was irrigated and again noted to be hemostatic.  The bladder was backfilled with 150 cc of saline.  The Foley catheter was removed.  Laparoscope was introduced into the bladder and noted to be without injury.  Bilateral ureteral orifices were identified with ureteral jets from each ureter.  The camera was removed and a new Foley catheter inserted into the bladder.  All instruments were removed and the robot was taken from the patient's bedside. The fascia at the 12 mm incision was closed with 0 Vicryl using a PMI device.  With the abdomen still insufflated, the supraumbilical robotic trocar was removed.  The skin was extended to a mini laparotomy incision of approximately 5 cm total.  The subcutaneous tissue was divided with electrocautery.  The fascial and peritoneal incision was extended with electrocautery.  The abdomen was desufflated and all trocars were  removed.  An Alexis retractor was placed.  The omentum was elevated out of the incision.  An area superior to the transverse colon was incised and the lesser sac was entered.  Careful dissection was done to remove the omentum from the length of the transverse colon with electrocautery.  The  omentum was adherent to the mesentery of the colon.  Careful dissection was performed with a combination of blunt dissection and sharp force electrocautery to mobilize the omentum off of the mesentery.  Pedicles were made in the omentum just caudad to the short gastrics. These were cauterized and transected with Ligasure cautery. All nodules were removed.  The abdomen was irrigated and hemostasis was noted at all pedicles.   At this time the surfaces and the abdomen and pelvis were palpated.  The small bowel was run in its entirety.  One 5mm nodule was removed with an Allis clamp from the jejunum.  The operative sites were again inspected and noted to be hemostatic.  The Alexis retractor was removed.  The mini laparotomy fascia was closed with a running stitch of #1 PDS.  Exparel  was injected in standard fashion.  The subcutaneous layer irrigated and made hemostatic.  This layer was then reapproximated with a running stitch of 2-0 Vicryl.  The skin was closed with 4-0 Vicryl in a deep dermal fashion followed by 4-0 Monocryl in a subcuticular fashion and surgical glue.  The skin at all laparoscopic incisions was closed with 4-0 Vicryl to reapproximate the subcutaneous tissue and 4-0 monocryl in a subcuticular fashion followed by surgical glue.  The vagina was inspected and noted to have bleeding from a superficial laceration at the posterior hymen. A figure of eight stitch with 3-0 vicryl was placed with hemostasis achieved.   Patient tolerated the procedure well. Sponge, lap, and instrument counts were correct.  Patient received 3 gm of Ancef  and 500 mg of metronidazole  prior to skin incision for routine perioperative antibiotic prophylaxis.  She was extubated and taken to the PACU in stable condition.  Hoy Masters, MD Gynecologic Oncology

## 2024-05-02 NOTE — Brief Op Note (Signed)
 05/02/2024  12:27 PM  PATIENT:  Susan Frederick  73 y.o. female  PRE-OPERATIVE DIAGNOSIS:  High grade serous carcinoma of Gyn origin  POST-OPERATIVE DIAGNOSIS:   High grade serous carcinoma of Gyn origin  PROCEDURE:  Procedure(s) with comments: ROBOTIC ASSISTED TOTAL LAPAROSCOPIC HYSTERECTOMY WITH BILATERAL SALPINGO-OOPHORECTOMY, MINILAPAROTOMY FOR OMENTECTOMY, PERITONEAL STRIPPING FOR DEBULKING (Bilateral) - ROBOTIC ASSISTED WITH DEBULKING CYSTOSCOPY (N/A)  SURGEON:  Surgeons and Role:    DEWAINE Eldonna Mays, MD - Primary    * Rogelio Planas, MD - Assisting   ANESTHESIA:   general  EBL:  200 mL   BLOOD ADMINISTERED:none  DRAINS: none   LOCAL MEDICATIONS USED:  OTHER EXPAREL , BUPIVICAINE  SPECIMEN:   ID Type Source Tests Collected by Time Destination  1 : uterus, cervix, bilateral tubes and ovaries Tissue PATH Gyn biopsy SURGICAL PATHOLOGY Eldonna Mays, MD 05/02/2024 [phone]   2 : Bladder peritoneum Tissue Path Tissue SURGICAL PATHOLOGY Eldonna Mays, MD 05/02/2024 1015   3 : Right inferior pericolic gutter peritoneum Tissue Path Tissue SURGICAL PATHOLOGY Eldonna Mays, MD 05/02/2024 1016   4 : Left inferior pericolic gutter peritoneum Tissue Path Tissue SURGICAL PATHOLOGY Eldonna Mays, MD 05/02/2024 1017   5 : omentum Tissue Path Tissue SURGICAL PATHOLOGY Eldonna Mays, MD 05/02/2024 1125   6 : jejunal nodule Tissue Path Tissue SURGICAL PATHOLOGY Eldonna Mays, MD 05/02/2024 1138      DISPOSITION OF SPECIMEN:  PATHOLOGY  COUNTS:  YES  TOURNIQUET:  * No tourniquets in log *  DICTATION: .Note written in EPIC  PLAN OF CARE: Admit for overnight observation  PATIENT DISPOSITION:  PACU - hemodynamically stable.   Delay start of Pharmacological VTE agent (>24hrs) due to surgical blood loss or risk of bleeding: no

## 2024-05-02 NOTE — Discharge Instructions (Signed)
 AFTER SURGERY INSTRUCTIONS   Return to work: 4-6 weeks if applicable   You may have a white honeycomb dressing over your larger incision if you have open surgery. This dressing can be removed 5 days after surgery and you do not need to reapply a new dressing. Once you remove the dressing, you will notice that you have the surgical glue (dermabond) on the incision and this will peel off on its own. You can get this dressing wet in the shower the days after surgery prior to removal on the 5th day.    You will need to be on a blood thinner after surgery to prevent blood clots for 2 weeks. This can be given in pill form or injections.    AVOID USE OF NSAIDS (IBUPROFEN , NAPROXEN) WHILE TAKING THE BLOOD THINNER.   PLAN ON STARTING ELIQUIS  (BLOOD THINNER) in the morning of May 04, 2024 (Thursday) since you received the blood thinner injection lovenox  before leaving the hospital on the 24th.  YOU WILL TAKE ELIQUIS  TWICE DAILY. THIS IS A BLOOD THINNER TO PREVENT BLOOD CLOTS  O YOU WILL NEED TO MONITOR FOR BLEEDING AND CALL THE OFFICE.  Activity: 1. Be up and out of the bed during the day.  Take a nap if needed.  You may walk up steps but be careful and use the hand rail.  Stair climbing will tire you more than you think, you may need to stop part way and rest.    2. No lifting or straining for 6 weeks over 10 pounds. No pushing, pulling, straining for 6 weeks.   3. No driving for 4-89 days when the following criteria have been met: Do not drive if you are taking narcotic pain medicine and make sure that your reaction time has returned.    4. You can shower as soon as the next day after surgery. Shower daily.  Use your regular soap and water  (not directly on the incision) and pat your incision(s) dry afterwards; don't rub.  No tub baths or submerging your body in water  until cleared by your surgeon. If you have the soap that was given to you by pre-surgical testing that was used before surgery, you  do not need to use it afterwards because this can irritate your incisions.    5. No sexual activity and nothing in the vagina for 12 weeks.   6. You may experience a small amount of clear drainage from your incisions, which is normal.  If the drainage persists, increases, or changes color please call the office.   7. Do not use creams, lotions, or ointments such as neosporin on your incisions after surgery until advised by your surgeon because they can cause removal of the dermabond glue on your incisions.     8. You may experience vaginal spotting after surgery or when the stitches at the top of the vagina begin to dissolve.  The spotting is normal but if you experience heavy bleeding, call our office.   9. Take Tylenol  first for pain if you are able to take these medication and only use narcotic pain medication for severe pain not relieved by the Tylenol .  Monitor your Tylenol  intake to a max of 4,000 mg in a 24 hour period.    Diet: 1. Low sodium Heart Healthy Diet is recommended but you are cleared to resume your normal (before surgery) diet after your procedure.   2. It is safe to use a laxative, such as Miralax  or Colace, if you  have difficulty moving your bowels before surgery. You have been prescribed Sennakot-S to take if constipated at bedtime every evening after surgery to keep bowel movements regular and to prevent constipation.     Wound Care: 1. Keep clean and dry.  Shower daily.   Reasons to call the Doctor: Fever - Oral temperature greater than 100.4 degrees Fahrenheit Foul-smelling vaginal discharge Difficulty urinating Nausea and vomiting Increased pain at the site of the incision that is unrelieved with pain medicine. Difficulty breathing with or without chest pain New calf pain especially if only on one side Sudden, continuing increased vaginal bleeding with or without clots.   Contacts: For questions or concerns you should contact:   Dr. Hoy Masters at  [phone]   Eleanor Epps, NP at [phone]   After Hours: call [phone] and have the GYN Oncologist paged/contacted (after 5 pm or on the weekends). You will speak with an after hours RN and let he or she know you have had surgery.   Messages sent via mychart are for non-urgent matters and are not responded to after hours so for urgent needs, please call the after hours number.

## 2024-05-03 ENCOUNTER — Other Ambulatory Visit (HOSPITAL_COMMUNITY): Payer: Self-pay

## 2024-05-03 ENCOUNTER — Encounter (HOSPITAL_COMMUNITY): Payer: Self-pay | Admitting: Psychiatry

## 2024-05-03 LAB — HEMOGLOBIN AND HEMATOCRIT, BLOOD
HCT: 29 % — ABNORMAL LOW (ref 36.0–46.0)
Hemoglobin: 9.1 g/dL — ABNORMAL LOW (ref 12.0–15.0)

## 2024-05-03 LAB — CBC
HCT: 26.6 % — ABNORMAL LOW (ref 36.0–46.0)
Hemoglobin: 8.7 g/dL — ABNORMAL LOW (ref 12.0–15.0)
MCH: 33 pg (ref 26.0–34.0)
MCHC: 32.7 g/dL (ref 30.0–36.0)
MCV: 100.8 fL — ABNORMAL HIGH (ref 80.0–100.0)
Platelets: 145 K/uL — ABNORMAL LOW (ref 150–400)
RBC: 2.64 MIL/uL — ABNORMAL LOW (ref 3.87–5.11)
RDW: 17.3 % — ABNORMAL HIGH (ref 11.5–15.5)
WBC: 8.5 K/uL (ref 4.0–10.5)
nRBC: 0 % (ref 0.0–0.2)

## 2024-05-03 LAB — BASIC METABOLIC PANEL WITH GFR
Anion gap: 12 (ref 5–15)
BUN: 10 mg/dL (ref 8–23)
CO2: 22 mmol/L (ref 22–32)
Calcium: 8.6 mg/dL — ABNORMAL LOW (ref 8.9–10.3)
Chloride: 103 mmol/L (ref 98–111)
Creatinine, Ser: 0.65 mg/dL (ref 0.44–1.00)
GFR, Estimated: >60 mL/min (ref >60)
Glucose, Bld: 112 mg/dL — ABNORMAL HIGH (ref 70–99)
Potassium: 3.5 mmol/L (ref 3.5–5.1)
Sodium: 137 mmol/L (ref 135–145)

## 2024-05-03 MED ORDER — METHOCARBAMOL 500 MG PO TABS
500.0000 mg | ORAL_TABLET | Freq: Once | ORAL | Status: AC
  Administered 2024-05-03: 500 mg via ORAL
  Filled 2024-05-03: qty 1

## 2024-05-03 MED ORDER — APIXABAN 2.5 MG PO TABS
2.5000 mg | ORAL_TABLET | Freq: Two times a day (BID) | ORAL | 0 refills | Status: DC
  Filled 2024-05-03: qty 28, 14d supply, fill #0

## 2024-05-03 MED ORDER — ENOXAPARIN SODIUM 40 MG/0.4ML IJ SOSY
40.0000 mg | PREFILLED_SYRINGE | INTRAMUSCULAR | Status: DC
  Administered 2024-05-03: 40 mg via SUBCUTANEOUS
  Filled 2024-05-03: qty 0.4

## 2024-05-03 NOTE — Consult Note (Signed)
 WOC Nurse ostomy follow up Pt did not receive an ostomy during surgery.  No further role for WOC team.  Please re-consult if further assistance is needed.  Thank-you,  Stephane Fought MSN, RN, CWOCN, CWCN-AP, CNS Contact Mon-Fri 0700-1500: [phone]

## 2024-05-03 NOTE — Anesthesia Postprocedure Evaluation (Signed)
 Anesthesia Post Note  Patient: Susan Frederick  Procedure(s) Performed: ROBOTIC ASSISTED TOTAL LAPAROSCOPIC HYSTERECTOMY WITH BILATERAL SALPINGO-OOPHORECTOMY, MINILAPAROTOMY FOR OMENTECTOMY, PERITONEAL STRIPPING FOR DEBULKING (Bilateral: Abdomen) CYSTOSCOPY (Bladder)     Patient location during evaluation: PACU Anesthesia Type: General Level of consciousness: awake Pain management: pain level controlled Vital Signs Assessment: post-procedure vital signs reviewed and stable Respiratory status: spontaneous breathing, nonlabored ventilation and respiratory function stable Cardiovascular status: blood pressure returned to baseline and stable Postop Assessment: no apparent nausea or vomiting Anesthetic complications: no   There were no known notable events for this encounter.  Last Vitals:  Vitals:   05/03/24 0147 05/03/24 0621  BP: (!) 156/47 (!) 137/50  Pulse: 70 73  Resp: 18 18  Temp: 36.9 C 37 C  SpO2: 98% 98%    Last Pain:  Vitals:   05/03/24 0634  TempSrc:   PainSc: 4                  [name] P [name]

## 2024-05-03 NOTE — Progress Notes (Signed)
 Discharge medication delivered to patient at bedside in a secure bag.

## 2024-05-03 NOTE — Progress Notes (Signed)
 Notified by RN of patient with continued right eye blurriness. No pain reported. No erythema visualized. Outpatient follow-up with an eye specialist encouraged if symptoms continue. Drops re-ordered.

## 2024-05-03 NOTE — Progress Notes (Signed)
   05/03/24 1036  TOC Brief Assessment  Insurance and Status Reviewed  Patient has primary care physician Yes  Home environment has been reviewed resides in a private residence  Prior level of function: Independent  Prior/Current Home Services No current home services  Social Drivers of Health Review SDOH reviewed no interventions necessary  Readmission risk has been reviewed Yes  Transition of care needs no transition of care needs at this time

## 2024-05-03 NOTE — Plan of Care (Signed)
 ?  Problem: Clinical Measurements: ?Goal: Ability to maintain clinical measurements within normal limits will improve ?Outcome: Progressing ?Goal: Will remain free from infection ?Outcome: Progressing ?Goal: Diagnostic test results will improve ?Outcome: Progressing ?  ?

## 2024-05-03 NOTE — Discharge Summary (Signed)
 Physician Discharge Summary  Patient ID: Susan Frederick MRN: [id number] DOB/AGE: 73-Aug-1952 73 y.o.  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    Hospital Course: On 05/02/2024, the patient underwent the following: Procedure(s): ROBOTIC ASSISTED TOTAL LAPAROSCOPIC HYSTERECTOMY WITH BILATERAL SALPINGO-OOPHORECTOMY, MINILAPAROTOMY FOR OMENTECTOMY, PERITONEAL STRIPPING FOR DEBULKING, CYSTOSCOPY. The postoperative course was uneventful.  She was discharged to home on postoperative day 1 tolerating a regular diet, ambulating, pain controlled, foley in place.   Consults: None  Significant Diagnostic Studies: Labs  Treatments: Surgery: see above  Discharge Exam (from am assessment): Blood pressure (!) 137/50, pulse 73, temperature 98.6 F (37 C), temperature source Oral, resp. rate 18, height 5' 6 (1.676 m), weight 289 lb (131.1 kg), SpO2 98%. General: alert, cooperative, and no distress Resp: clear to auscultation bilaterally Cardio: regular rate and rhythm, S1, S2 normal, no murmur, click, rub or gallop GI: incision: lap sites incisions to the abdomen along with mini lap incision with op site dressing in place with no drainage, incisions intact and abdomen obese, active bowel sounds, appropriately tender Extremities: L ankle slightly larger than R (per pt prev surgery)  Disposition:  Discharge disposition: 01-Home or Self Care       Discharge Instructions     Call MD for:  difficulty breathing, headache or visual disturbances   Complete by: As directed    Call MD for:  extreme fatigue   Complete by: As directed    Call MD for:  hives   Complete by: As directed    Call MD for:  persistant dizziness or  light-headedness   Complete by: As directed    Call MD for:  persistant nausea and vomiting   Complete by: As directed    Call MD for:  redness, tenderness, or signs of infection (pain, swelling, redness, odor or green/yellow discharge around incision site)   Complete by: As directed    Call MD for:  severe uncontrolled pain   Complete by: As directed    Call MD for:  temperature >100.4   Complete by: As directed    Diet - low sodium heart healthy   Complete by: As directed    Discharge wound care:   Complete by: As directed    You will have a white honeycomb dressing over your larger incision. This dressing can be removed 5 days after surgery and you do not need to reapply a new dressing. Once you remove the dressing, you will notice that you have the surgical glue (dermabond) on the incision and this will peel off on its own. You can get this dressing wet in the shower the days after surgery prior to removal on the 5th day.   Driving Restrictions   Complete by: As directed    No driving for 4-89 days.  Do not take narcotics and drive. You need to make sure your reaction time has returned.   Increase activity slowly   Complete by: As directed    Lifting restrictions   Complete by: As directed    No lifting greater than 10 lbs, pushing, pulling, straining for 6 weeks.   Sexual Activity Restrictions   Complete by: As directed    No sexual activity, nothing in the vagina, for 12 weeks.  Allergies as of 05/03/2024   No Known Allergies      Medication List     STOP taking these medications    bisacodyl  5 MG EC tablet Generic drug: bisacodyl    ibuprofen  200 MG tablet Commonly known as: ADVIL    metroNIDAZOLE  500 MG tablet Commonly known as: FLAGYL    senna 8.6 MG Tabs tablet Commonly known as: SENOKOT       TAKE these medications    Acetaminophen  500 MG capsule   Eliquis  2.5 MG Tabs tablet Generic drug: apixaban  Take 1 tablet (2.5 mg total) by mouth 2 (two)  times daily. Start taking on: May 04, 2024   lidocaine -prilocaine  cream Commonly known as: EMLA  Apply 1 Application topically as needed (apply to [facility] 1-2 hours prior to appointment).   lisinopril  20 MG tablet Commonly known as: ZESTRIL  Take 1 tablet (20 mg total) by mouth daily.   loratadine 10 MG tablet Commonly known as: CLARITIN   LORazepam  0.5 MG tablet Commonly known as: ATIVAN  Take 1 tablet (0.5 mg total) by mouth at bedtime as needed for sleep.   MIRALAX  PO Take 17 g by mouth daily as needed (constipation.). What changed: Another medication with the same name was removed. Continue taking this medication, and follow the directions you see here.   PreserVision AREDS 2 Caps Take 1 capsule by mouth 2 (two) times a week.   multivitamin with minerals tablet Take 1 tablet by mouth every evening.  Centrum Womens Silver 50+   OSTEO BI-FLEX ONE PER DAY PO Take 1 tablet by mouth once a week.   oxyCODONE  5 MG immediate release tablet Commonly known as: Oxy IR/ROXICODONE  Take 1 tablet (5 mg total) by mouth every 6 (six) hours as needed for severe pain (pain score 7-10). For AFTER surgery only, do not take and drive.   sertraline  50 MG tablet Commonly known as: ZOLOFT  Take 1 tablet (50 mg total) by mouth daily.   Stool Softener/Laxative 50-8.6 MG tablet Generic drug: senna-docusate Take 2 tablets by mouth at bedtime. For AFTER surgery, do not take if having diarrhea.               Discharge Care Instructions  (From admission, onward)           Start     Ordered   05/03/24 0000  Discharge wound care:       Comments: You will have a white honeycomb dressing over your larger incision. This dressing can be removed 5 days after surgery and you do not need to reapply a new dressing. Once you remove the dressing, you will notice that you have the surgical glue (dermabond) on the incision and this will peel off on its own. You can get this dressing wet in the  shower the days after surgery prior to removal on the 5th day.   05/03/24 1249            Follow-up Information     Eldonna Mays, MD Follow up on 05/22/2024.   Specialty: Gynecologic Oncology Why: at 1pm at the [facility] information: [address] [place] [address] [phone]         [facility] Dept Of [place]. [facility] Follow up on 05/05/2024.   Specialty: Gynecologic Oncology Why: at 11 am at the [facility] for foley removal and voiding trial. Contact information: [address] [place]  [phone]  Greater than thirty minutes were spend for face to face discharge instructions and discharge orders/summary in EPIC.   Signed: Yannet Rincon D Darcie Mellone 05/03/2024, 4:35 PM

## 2024-05-03 NOTE — Progress Notes (Signed)
 1 Day Post-Op Procedure(s) (LRB): ROBOTIC ASSISTED TOTAL LAPAROSCOPIC HYSTERECTOMY WITH BILATERAL SALPINGO-OOPHORECTOMY, MINILAPAROTOMY FOR OMENTECTOMY, PERITONEAL STRIPPING FOR DEBULKING (Bilateral) CYSTOSCOPY (N/A)  Subjective: Patient reports having severe right eye pain immediately after surgery. She states it has slightly improved but she still has soreness and blurred vision in this eye. She also feels she has a UTI. States she is having the sensations she has had in the past. She got up with assist with the walker and felt this went well. Concerned about getting out of bed at home. No flatus or BM. No nausea or emesis. Has been sipping on liquids. Ordering solid food for breakfast. Pain in abdomen at rest is manageable at a level 4-5. She has been using the oxycodone  as needed and this has been assisting with pain relief. Denies chest pain, dyspnea.   Objective: Vital signs in last 24 hours: Temp:  [97.5 F (36.4 C)-98.6 F (37 C)] 98.6 F (37 C) (09/24 0621) Pulse Rate:  [52-73] 73 (09/24 0621) Resp:  [11-18] 18 (09/24 0621) BP: (124-161)/(47-69) 137/50 (09/24 0621) SpO2:  [96 %-100 %] 98 % (09/24 0621) Last BM Date : 05/01/24  Intake/Output from previous day: 09/23 0701 - 09/24 0700 In: 3718.7 [P.O.:920; I.V.:2548.7; IV Piggyback:250] Out: 2225 [Urine:2025; Blood:200]  Physical Examination: General: alert, cooperative, and no distress Resp: clear to auscultation bilaterally Cardio: regular rate and rhythm, S1, S2 normal, no murmur, click, rub or gallop GI: incision: lap sites incisions to the abdomen along with mini lap incision with op site dressing in place with no drainage, incisions intact and abdomen obese, active bowel sounds, appropriately tender Extremities: L ankle slightly larger than R (per pt prev surgery)  Labs: WBC/Hgb/Hct/Plts:  8.5/8.7/26.6/145 (09/24 0503) BUN/Cr/glu/ALT/AST/amyl/lip:  10/0.65/--/--/--/--/-- (09/24 0503)  Assessment: 73 y.o. s/p  Procedure(s): ROBOTIC ASSISTED TOTAL LAPAROSCOPIC HYSTERECTOMY WITH BILATERAL SALPINGO-OOPHORECTOMY, MINILAPAROTOMY FOR OMENTECTOMY, PERITONEAL STRIPPING FOR DEBULKING CYSTOSCOPY: stable Pain:  Pain is well-controlled on PRN medications.  Heme: Hgb 8.7 and Hct 26.6 this am. Plan for repeat H&H to assess for stability.  ID: WBC 8.5- given intra-op abx and decadron . No evidence of infection at this time.   CV: BP and HR stable. Continue to monitor with ordered vital signs.  GI:  Tolerating po: Yes. Antiemetics ordered if needed.  GU: Foley in place. Creatinine 0.65 this am. Adequate urine output recorded.    FEN: No critical values on am labs.  Prophylaxis: SCDs. Lovenox  ordered-on hold pending repeat H&H  Plan: Repeat H&H this am RN to contact anesthesia about R eye discomfort Encourage increasing ambulation If meeting milestones later today, plan for discharge home Will arrange for voiding trial/foley removal in the office for this Friday   LOS: 1 day    Tijuan Dantes D Jackson Coffield 05/03/2024, 7:40 AM

## 2024-05-04 ENCOUNTER — Encounter: Payer: Self-pay | Admitting: Psychiatry

## 2024-05-04 ENCOUNTER — Telehealth: Payer: Self-pay

## 2024-05-04 ENCOUNTER — Telehealth: Payer: Self-pay | Admitting: *Deleted

## 2024-05-04 NOTE — Telephone Encounter (Signed)
 Spoke with Ms. Marina this morning. She states she is eating, drinking and her foley catheter is draining. Pt called the office this morning and sent a picture of her foley catheter bag with a urine meter attached with concerns that the urimeter was not draining into the foley bag for her to empty the bag well. Office staff assisted patient over the phone to trouble shoot emptying the bag. Advised patient that she doesn't have to empty the bag unless it becomes full, and once the urimeter is full it will drain automatically into the bag but if this bothers her, once the urine makes it into the first section of the urine meter the blue twist top at the bottom can be opened and the urine can be drained into the toilet. Pt also encouraged to drink more fluids as her urine looked amber colored and the bag has not been full, just the front urimeter.  Pt verbalized understanding.   She has not had a BM yet and is not passing gas. Encouraged ambulation as much as possible and to try hot liquids and over the counter gas-x. She is taking senokot as prescribed and encouraged her to drink plenty of water  with the senokot-s.  She denies fever or chills. Incisions are dry and intact. She rates her pain 4/10. Her pain is controlled with oxycodone .     Instructed to call office with any fever, chills, purulent drainage, uncontrolled pain or any other questions or concerns. Patient verbalizes understanding.   Pt aware of her appt. tomorrow for void trial and  post op appointments as well as the office number [phone] and after hours number [phone] to call if she has any questions or concerns.

## 2024-05-04 NOTE — Telephone Encounter (Signed)
 S/P  Robotic-assisted total laparoscopic hysterectomy, bilateral salpingo-oophorectomy, radical dissection for tumor debulking with peritoneal stripping, mini laparotomy for omentectomy, cystoscopy on 9/23 with Dr.Newton.   Pt reports she was discharged yesterday afternoon, she has a foley catheter and states she is having a hard time getting it to drain from the plastic canister into the bag itself. She doesn't feel there is urine backed up in her bladder, no pain in that area.    Pt will send a picture of the type of catheter through Mychart. I tried walking her through the process, making sure she keeps it below the waist for gravity purposes. She was frustrated through the process stating this just isn't working She will continue to work drain it until she gets a post op call from [place] this morning.

## 2024-05-05 ENCOUNTER — Ambulatory Visit

## 2024-05-05 ENCOUNTER — Ambulatory Visit: Payer: Self-pay | Admitting: *Deleted

## 2024-05-05 ENCOUNTER — Inpatient Hospital Stay

## 2024-05-05 VITALS — HR 89 | Temp 98.3°F | Resp 18 | Wt 288.0 lb

## 2024-05-05 DIAGNOSIS — C569 Malignant neoplasm of unspecified ovary: Secondary | ICD-10-CM

## 2024-05-05 DIAGNOSIS — R82998 Other abnormal findings in urine: Secondary | ICD-10-CM

## 2024-05-05 DIAGNOSIS — R34 Anuria and oliguria: Secondary | ICD-10-CM

## 2024-05-05 DIAGNOSIS — R39198 Other difficulties with micturition: Secondary | ICD-10-CM | POA: Diagnosis not present

## 2024-05-05 DIAGNOSIS — C772 Secondary and unspecified malignant neoplasm of intra-abdominal lymph nodes: Secondary | ICD-10-CM | POA: Diagnosis not present

## 2024-05-05 DIAGNOSIS — C579 Malignant neoplasm of female genital organ, unspecified: Secondary | ICD-10-CM | POA: Diagnosis present

## 2024-05-05 DIAGNOSIS — R971 Elevated cancer antigen 125 [CA 125]: Secondary | ICD-10-CM | POA: Diagnosis not present

## 2024-05-05 DIAGNOSIS — Z9221 Personal history of antineoplastic chemotherapy: Secondary | ICD-10-CM | POA: Diagnosis not present

## 2024-05-05 DIAGNOSIS — C786 Secondary malignant neoplasm of retroperitoneum and peritoneum: Secondary | ICD-10-CM | POA: Diagnosis not present

## 2024-05-05 LAB — CBC (CANCER CENTER ONLY)
HCT: 27.4 % — ABNORMAL LOW (ref 36.0–46.0)
Hemoglobin: 9.1 g/dL — ABNORMAL LOW (ref 12.0–15.0)
MCH: 32.6 pg (ref 26.0–34.0)
MCHC: 33.2 g/dL (ref 30.0–36.0)
MCV: 98.2 fL (ref 80.0–100.0)
Platelet Count: 192 K/uL (ref 150–400)
RBC: 2.79 MIL/uL — ABNORMAL LOW (ref 3.87–5.11)
RDW: 16.8 % — ABNORMAL HIGH (ref 11.5–15.5)
WBC Count: 8.7 K/uL (ref 4.0–10.5)
nRBC: 0 % (ref 0.0–0.2)

## 2024-05-05 LAB — BASIC METABOLIC PANEL WITH GFR
Anion gap: 9 (ref 5–15)
BUN: 9 mg/dL (ref 8–23)
CO2: 26 mmol/L (ref 22–32)
Calcium: 9 mg/dL (ref 8.9–10.3)
Chloride: 101 mmol/L (ref 98–111)
Creatinine, Ser: 0.78 mg/dL (ref 0.44–1.00)
GFR, Estimated: >60 mL/min (ref >60)
Glucose, Bld: 127 mg/dL — ABNORMAL HIGH (ref 70–99)
Potassium: 3.7 mmol/L (ref 3.5–5.1)
Sodium: 136 mmol/L (ref 135–145)

## 2024-05-05 NOTE — Progress Notes (Signed)
 Patient voided 100 ml within 10 minutes of back filling urine with 210 cc of normal saline. Patient denies any burning or pain on urination.  Prior to void trail, foley was clamped off and a sample of urine was sent for culture.   Patient again voided an additional 100 cc of clear, yellow urine at 1315. Pt again denies pain or burning on urination.  Labs were drawn prior to patient leaving the clinic accompanied by her sister. Pt aware to call the after hours line if she develops any fever, chills, pain on urination, frequency and or urgency. Pt is aware of all post op activity restrictions and instructed to call with any questions or concerns.

## 2024-05-05 NOTE — Telephone Encounter (Signed)
 Spoke with Susan Frederick who states she is feeling well and has napped this afternoon.   Relayed message that patient's labs are stable and kidney function is within normal range. We will let you know when the urine culture results return.   Patient states she has voided since she has been home and states it was about 1 oz and she has drank 8 oz of water . Pt reports that she has been napping in a recliner and denies pelvic pain or pressure. Pt is aware to continue to monitor her urine out put and encouraged to empty her bladder every 2-3 hours, encouraged to drink plenty of fluids at least 6 -8 oz every 2-3 hour. Advised to call the after hours number [phone] and have the MD on call paged if she does not have increased urine output. Pt verbalized understanding and thanked the office for calling.

## 2024-05-05 NOTE — Patient Instructions (Addendum)
 We have sent a sample of your urine for culture today to test for a urinary infection. We will contact you with the results.   We have also checked a CBC and Bmet to check your kidney function.   Please call for any new symptoms such as increased pain, fever, difficulty emptying your bladder if foley removed, heavy bleeding.  You will need to void on a regular basis and call for decreased output, if unable to empty your bladder, voiding little amounts.   Plan to follow up as scheduled with Dr. Eldonna or sooner if needed.

## 2024-05-05 NOTE — Progress Notes (Signed)
 210 cc of normal saline backfilled in the bladder.

## 2024-05-05 NOTE — Telephone Encounter (Signed)
-----   Message from Eleanor JONETTA Epps sent at 05/05/2024  2:22 PM EDT ----- Please let her know her labs from today are stable. Kidney function is within normal range. We will let her know when the urine culture results return. ----- Message ----- From: Interface, Lab In [place] Sent: 05/05/2024   1:30 PM EDT To: Eleanor JONETTA Epps, NP

## 2024-05-06 LAB — URINE CULTURE: Culture: NO GROWTH

## 2024-05-08 ENCOUNTER — Encounter: Payer: Self-pay | Admitting: *Deleted

## 2024-05-08 NOTE — Telephone Encounter (Signed)
-----   Message from Eleanor JONETTA Epps sent at 05/05/2024  2:22 PM EDT ----- Please let her know her labs from today are stable. Kidney function is within normal range. We will let her know when the urine culture results return. ----- Message ----- From: Interface, Lab In [place] Sent: 05/05/2024   1:30 PM EDT To: Eleanor JONETTA Epps, NP

## 2024-05-08 NOTE — Telephone Encounter (Signed)
-----   Message from Eleanor JONETTA Epps sent at 05/08/2024  9:41 AM EDT ----- No evidence of UTI on culture. ----- Message ----- From: Rebecka, Lab In [place] Sent: 05/05/2024   1:30 PM EDT To: Eleanor JONETTA Epps, NP

## 2024-05-08 NOTE — Telephone Encounter (Signed)
 Attempted to reach Susan Frederick to relay results of urine culture. Left voicemail requesting call back.

## 2024-05-08 NOTE — Telephone Encounter (Signed)
 Spoke with Susan Frederick to relay that urine culture is negative for infection.  Pt states she has been voiding without difficulty, measured 700 ml on Friday evening and averaged 1300 ml each  on Saturday and Sunday.   Patient is doing well and reminded of her post op visit with Dr. Eldonna on 05/22/24.   Pt is asking if it's ok to received a flu and covid vaccine? Or wait until after her post-op visit with Dr. Eldonna? Advised patient her message will be relayed to providers. Pt thanked the office for calling.

## 2024-05-16 ENCOUNTER — Encounter: Payer: Self-pay | Admitting: Oncology

## 2024-05-19 ENCOUNTER — Telehealth: Payer: Self-pay

## 2024-05-19 NOTE — Telephone Encounter (Signed)
 Per Eleanor Epps NP, symptoms likely coming from COVID vaccine. Pt aware to monitor and if things get worse over the weekend, she is to seek care at urgent care. Pt voiced an understanding

## 2024-05-19 NOTE — Telephone Encounter (Signed)
 S/P  Robotic-assisted total laparoscopic hysterectomy, bilateral salpingo-oophorectomy, radical dissection for tumor debulking with peritoneal stripping, mini laparotomy for omentectomy, cystoscopy 9/23 with Dr.Newton  Susan Frederick called office with a concern of having a low grade fever today. She states an hour ago her temp was 101, she took Tylenol  and right before call it was 100.7. Reports having chills right after lunch and just wanted to crawl in bed under the covers. No coughing but does clear her throat a lot. Reports runny nose but not consistent.No sore throat. Slight headache. No bleeding, No UTI S&S, no N/V. Incisions dry/intact.   She did receive the COVID vaccine yesterday. Post op appointment with Dr.Newton is Monday 10/13.

## 2024-05-22 ENCOUNTER — Encounter: Payer: Self-pay | Admitting: Nurse Practitioner

## 2024-05-22 ENCOUNTER — Other Ambulatory Visit: Payer: Self-pay | Admitting: Oncology

## 2024-05-22 ENCOUNTER — Telehealth: Payer: Self-pay

## 2024-05-22 ENCOUNTER — Inpatient Hospital Stay: Attending: Physician Assistant | Admitting: Psychiatry

## 2024-05-22 ENCOUNTER — Encounter: Payer: Self-pay | Admitting: Psychiatry

## 2024-05-22 VITALS — BP 132/70 | HR 84 | Temp 97.7°F | Resp 20 | Wt 279.4 lb

## 2024-05-22 DIAGNOSIS — G620 Drug-induced polyneuropathy: Secondary | ICD-10-CM | POA: Insufficient documentation

## 2024-05-22 DIAGNOSIS — C5702 Malignant neoplasm of left fallopian tube: Secondary | ICD-10-CM | POA: Diagnosis present

## 2024-05-22 DIAGNOSIS — C569 Malignant neoplasm of unspecified ovary: Secondary | ICD-10-CM

## 2024-05-22 DIAGNOSIS — R3915 Urgency of urination: Secondary | ICD-10-CM | POA: Insufficient documentation

## 2024-05-22 DIAGNOSIS — Z90710 Acquired absence of both cervix and uterus: Secondary | ICD-10-CM | POA: Insufficient documentation

## 2024-05-22 DIAGNOSIS — Z5111 Encounter for antineoplastic chemotherapy: Secondary | ICD-10-CM | POA: Diagnosis present

## 2024-05-22 DIAGNOSIS — C541 Malignant neoplasm of endometrium: Secondary | ICD-10-CM | POA: Insufficient documentation

## 2024-05-22 DIAGNOSIS — Z90722 Acquired absence of ovaries, bilateral: Secondary | ICD-10-CM | POA: Insufficient documentation

## 2024-05-22 DIAGNOSIS — Z9079 Acquired absence of other genital organ(s): Secondary | ICD-10-CM | POA: Diagnosis not present

## 2024-05-22 DIAGNOSIS — Z7189 Other specified counseling: Secondary | ICD-10-CM

## 2024-05-22 DIAGNOSIS — C5701 Malignant neoplasm of right fallopian tube: Secondary | ICD-10-CM

## 2024-05-22 NOTE — Telephone Encounter (Signed)
 I reached out to Susan Frederick for a follow up call from Friday 10/10. Pt states she is feeling much better, no fever over the weekend. She will see us  today at her appointment with Dr. Eldonna

## 2024-05-22 NOTE — Progress Notes (Signed)
 Gynecologic Oncology Multi-Disciplinary Disposition Conference Note  Date of the Conference: 05/22/2024  Patient Name: Susan Frederick  Primary GYN Oncologist: Dr. 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 Disposition is to adjuvant chemotherapy followed by observation.   This Multidisciplinary conference took place involving physicians from Gynecologic Oncology, Medical Oncology, Radiation Oncology, Pathology, Radiology along with the Gynecologic Oncology Nurse Practitioner and Gynecologic Oncology Nurse Navigator.  Comprehensive assessment of the patient's malignancy, staging, need for surgery, chemotherapy, radiation therapy, and need for further testing were reviewed. Supportive measures, both inpatient and following discharge were also discussed. The recommended plan of care is documented. Greater than 35 minutes were spent correlating and coordinating this patient's care.

## 2024-05-22 NOTE — Progress Notes (Signed)
 Gynecologic Oncology Return Clinic Visit  Date of Service: 05/22/2024 Referring Provider: Johnston Police, PA-C  Medical Oncologist: Arley Hof, MD  Assessment & Plan: Susan Frederick is a 74 y.o. 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  Postop: - Pt recovering well from surgery and healing appropriately postoperatively - Ongoing postoperative expectations and precautions reviewed. Continue with no lifting >10lbs through 6 weeks postoperatively  Fallopian tube carcinoma: - Intraoperative findings and pathology results reviewed. - Recommend additional 2-3 cycles of adjuvant chemotherapy (would favor 3 if able to tolerate).  - Germline testing: negative - NGS ([employer]): HRD neg, MSS, TMB low      >> Mutations - CCNE1, KRAS, PIK3CA, TP53, SF3B1 - Signs/symptoms of recurrence reviewed. - CA125 is a marker for her - Recommend q72mo surveillance initially after completion of adjuvant therapy. - Okay to resume adjuvant treatment at 4 weeks postop or when scheduled by her medical oncologist.   Endometrial cancer: - Pathology results reviewed in detail - No high-intermediate risk factors. - Follow-up p53, MMR. - Recommend observation. - Signs/symptoms of recurrence reviewed. - Surveillance driven by FT carcinoma as above.   RTC after completion of adjuvant therapy.  Hoy Masters, MD Gynecologic Oncology   Medical Decision Making I personally spent  TOTAL 60 minutes face-to-face and non-face-to-face in the care of this patient, which includes all pre, intra, and post visit time on the date of service. The discussion of fallopian tube carcinoma management is beyond the scope of routine postoperative  care.   ----------------------- Reason for Visit: Postop/Treatment counseling  Treatment History: Oncology History  Fallopian tube carcinoma (HCC)  01/27/2024 Initial Diagnosis   Gynecologic cancer (HCC)   01/27/2024 Cancer Staging   Staging form: Ovary, Fallopian Tube, and Primary Peritoneal Carcinoma, AJCC 8th Edition - Clinical: FIGO Stage IV (cTX, cNX, cM1) - Signed by Hof Arley NOVAK, MD on 01/27/2024 Sites of metastasis: Distant lymph nodes, NOS   02/02/2024 -  Chemotherapy   Patient is on Treatment Plan : OVARIAN Carboplatin  (AUC 6) + Paclitaxel  (175) q21d X 6 Cycles     05/02/2024 Surgery   Procedures: Robotic-assisted total laparoscopic hysterectomy, bilateral salpingo-oophorectomy, radical dissection for tumor debulking with peritoneal stripping, mini laparotomy for omentectomy, cystoscopy   Findings: On bimanual exam, small mobile uterus.  No palpable adnexal mass.  Suspected perforation with placement of uterine manipulator.  On laparoscopic entry, overall excellent response to therapy.  Areas of apparent treated disease on bilateral diaphragm.  Minimal apparent disease of omentum.  No evidence of disease on the bowel.  Miliary areas of mostly treated disease and possible small residual disease on small bowel mesentery.  In the pelvis, small normal-appearing uterus.  Posterior uterine wall perforation without bleeding.  Mildly dilated bilateral fallopian tubes.  No obvious adnexal mass.  Adhesions of the colon to the left pelvic sidewall and the left adnexa.  Bladder adherent to the anterior uterus.  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  Apparent treated disease along the posterior cul-de-sac peritoneum.  Cystoscopy with intact bladder and no evidence of injury.  Bilateral ureteral orifices identified with jets bilaterally.  Overall, nearly complete gross resection with possible few scattered areas of miliary disease (<64mm) on bowel mesentery and right diaphragm.     05/02/2024 Pathology  Results   A. UTERUS, CERVIX, BILATERAL TUBES AND OVARIES:  - Two separate tumors: i) Endometrioid carcinoma, FIGO grade 1, approximately 4.4 cm, confined to an endometrial polyp ii) High-grade serous carcinoma, involving left fallopian tube - Endometrial carcinoma shows no evidence of myometrial invasion - Focal tumor deposit is seen on the surface of right fallopian tube - Bilateral ovaries show evidence of psammomatous calcifications but carcinoma is not identified - No evidence of lymphovascular invasion - Benign unremarkable cervix - See oncology table - See comment  B. BLADDER PERITONEUM: - Involved by high-grade serous carcinoma with associated psammomatous calcifications  C. RIGHT INFERIOR PERICOLIC GUTTER PERITONEUM: - Involved by high-grade serous carcinoma with associated psammomatous calcifications  D. LEFT INFERIOR PERICOLIC GUTTER PERITONEUM: - Involved by high-grade serous carcinoma with associated psammomatous calcifications  E. OMENTUM: - Involved by high-grade serous carcinoma with associated psammomatous calcifications  F. JEJUNAL NODULE: - Psammomatous calcifications identified      Interval History: Pt reports that she is recovering well from surgery. She is not needing anything for pain. She is eating and drinking well. She is voiding without issue and having regular bowel movements. No bleeding.   Past Medical/Surgical History: Past Medical History:  Diagnosis Date   Arthritis    Cancer (HCC)    GYY,METS WITH OMENTUM   DDD (degenerative disc disease), lumbar    Diverticulosis of colon    Hematuria    Hypertension    Mixed stress and urge urinary incontinence    Obesity    OSA on CPAP    Pre-diabetes     Past Surgical History:  Procedure Laterality Date   ANKLE FUSION WITH GASTROC SLIDE Left 2010   COLONOSCOPY     CYSTOSCOPY N/A 05/02/2024   Procedure: CYSTOSCOPY;  Surgeon: Eldonna Mays, MD;  Location: [facility];   Service: Gynecology;  Laterality: N/A;   HAND TENDON SURGERY     right   HYSTERECTOMY,ROBOT,W/OMENTECT/SALPINGO-OOPHORECT FOR MALIGNANT NEOPLASM Bilateral 05/02/2024   Procedure: ROBOTIC ASSISTED TOTAL LAPAROSCOPIC HYSTERECTOMY WITH BILATERAL SALPINGO-OOPHORECTOMY, MINILAPAROTOMY FOR OMENTECTOMY, PERITONEAL STRIPPING FOR DEBULKING;  Surgeon: Eldonna Mays, MD;  Location: [facility];  Service: Gynecology;  Laterality: Bilateral;  ROBOTIC ASSISTED WITH DEBULKING   IR IMAGING GUIDED PORT INSERTION  01/18/2024   KNEE ARTHROSCOPY     left   NOSE SURGERY     deviated septum   POLYPECTOMY     ROOT CANAL  2025   x2   TOE SURGERY     right foot/ little toe   WISDOM TOOTH EXTRACTION  08/11/1967    Family History  Problem Relation Age of Onset   Endometrial cancer Sister 62       paternal half sister   Skin cancer Sister        basal/squamous   Lung cancer Maternal Aunt    Breast cancer Maternal Grandmother 57 - 66   Throat cancer Maternal Grandfather 40 - 48   Ovarian cancer Neg Hx    Colon cancer Neg Hx     Social History   Socioeconomic History   Marital status: Single    Spouse name: Not on file   Number of children: Not on file   Years of education: Not on file   Highest education level: Not on file  Occupational History   Not on file  Tobacco Use   Smoking status: Former    Current packs/day: 0.00    Average packs/day: 1 pack/day for 17.0 years (17.0 ttl pk-yrs)    Types: Cigarettes  Start date: 22    Quit date: 9    Years since quitting: 39.8   Smokeless tobacco: Never   Tobacco comments:    Smoked for 18 years, quit in 1986.   Vaping Use   Vaping status: Never Used  Substance and Sexual Activity   Alcohol use: Not Currently    Comment: socially   Drug use: No   Sexual activity: Never  Other Topics Concern   Not on file  Social History Narrative   Not on file   Social Drivers of Health   Financial Resource Strain: Low Risk  (12/18/2023)   Received  from [facility]   Overall Financial Resource Strain (CARDIA)    Difficulty of Paying Living Expenses: Not hard at all  Food Insecurity: No Food Insecurity (05/02/2024)   Hunger Vital Sign    Worried About Running Out of Food in the Last Year: Never true    Ran Out of Food in the Last Year: Never true  Transportation Needs: No Transportation Needs (05/02/2024)   PRAPARE - Administrator, Civil Service (Medical): No    Lack of Transportation (Non-Medical): No  Physical Activity: Inactive (12/18/2023)   Received from [facility]   Exercise Vital Sign    On average, how many days per week do you engage in moderate to strenuous exercise (like a brisk walk)?: 0 days    On average, how many minutes do you engage in exercise at this level?: 60 min  Stress: No Stress Concern Present (12/18/2023)   Received from [facility] - Occupational Stress Questionnaire    Feeling of Stress : Not at all  Social Connections: Moderately Integrated (05/02/2024)   Social Connection and Isolation Panel    Frequency of Communication with Friends and Family: More than three times a week    Frequency of Social Gatherings with Friends and Family: More than three times a week    Attends Religious Services: More than 4 times per year    Active Member of [employer] or Organizations: Yes    Attends Engineer, structural: More than 4 times per year    Marital Status: Separated    Current Medications:  Current Outpatient Medications:    Acetaminophen  500 MG capsule, , Disp: , Rfl:    apixaban  (ELIQUIS ) 2.5 MG TABS tablet, Take 1 tablet (2.5 mg total) by mouth 2 (two) times daily. (Patient not taking: Reported on 05/18/2024), Disp: 28 tablet, Rfl: 0   Boswellia-Glucosamine-Vit D (OSTEO BI-FLEX ONE PER DAY PO), Take 1 tablet by mouth once a week. (Patient taking differently: Take 1 tablet by mouth once a week. As needed for knee pain), Disp: , Rfl:     lidocaine -prilocaine  (EMLA ) cream, Apply 1 Application topically as needed (apply to Port 1-2 hours prior to appointment)., Disp: 30 g, Rfl: 2   lisinopril  (ZESTRIL ) 20 MG tablet, Take 1 tablet (20 mg total) by mouth daily., Disp: 90 tablet, Rfl: 3   loratadine (CLARITIN) 10 MG tablet, , Disp: , Rfl:    LORazepam  (ATIVAN ) 0.5 MG tablet, Take 1 tablet (0.5 mg total) by mouth at bedtime as needed for sleep., Disp: 30 tablet, Rfl: 0   Multiple Vitamins-Minerals (MULTIVITAMIN WITH MINERALS) tablet, Take 1 tablet by mouth every evening.  Centrum Womens Silver 50+, Disp: , Rfl:    Multiple Vitamins-Minerals (PRESERVISION AREDS 2) CAPS, Take 1 capsule by mouth 2 (two) times a week., Disp: , Rfl:  oxyCODONE  (OXY IR/ROXICODONE ) 5 MG immediate release tablet, Take 1 tablet (5 mg total) by mouth every 6 (six) hours as needed for severe pain (pain score 7-10). For AFTER surgery only, do not take and drive. (Patient not taking: Reported on 05/18/2024), Disp: 15 tablet, Rfl: 0   Polyethylene Glycol 3350  (MIRALAX  PO), Take 17 g by mouth daily as needed (constipation.)., Disp: , Rfl:    senna-docusate (SENOKOT-S) 8.6-50 MG tablet, Take 2 tablets by mouth at bedtime. For AFTER surgery, do not take if having diarrhea., Disp: 30 tablet, Rfl: 0   sertraline  (ZOLOFT ) 50 MG tablet, Take 1 tablet (50 mg total) by mouth daily., Disp: 90 tablet, Rfl: 1  Review of Symptoms: Complete 10-system review is positive for: Shortness of breath, constipation, joint pain, headache occasional, pelvic pain, feet numbness, fatigue, occasional abdominal pain, cough, problem walking  Physical Exam: BP 132/70 Comment: manual recheck  Pulse 84   Temp 97.7 F (36.5 C) (Oral)   Resp 20   Wt 279 lb 6.4 oz (126.7 kg)   SpO2 96%   BMI 45.10 kg/m  General: Alert, oriented, no acute distress. HEENT: Normocephalic, atraumatic. Neck symmetric without masses. Sclera anicteric.  Chest: Normal work of breathing. Clear to auscultation  bilaterally.   Cardiovascular: Regular rate and rhythm, no murmurs. Abdomen: Soft, nontender.  Normoactive bowel sounds.  No masses appreciated.  Well-healing incisions with glue. Extremities: Grossly normal range of motion.  Warm, well perfused.  No edema bilaterally. Skin: No rashes or lesions noted. GU: Normal appearing external genitalia without erythema, excoriation, or lesions.  Speculum exam reveals intact well healing cuff.  Bimanual exam reveals smoooth intact cuff. Exam chaperoned by Kimberly [place]az[place], CMA   Laboratory & Radiologic Studies: Surgical pathology (05/02/24): A. UTERUS, CERVIX, BILATERAL TUBES AND OVARIES:  - Two separate tumors: i) Endometrioid carcinoma, FIGO grade 1, approximately 4.4 cm, confined to an endometrial polyp ii) High-grade serous carcinoma, involving left fallopian tube - Endometrial carcinoma shows no evidence of myometrial invasion - Focal tumor deposit is seen on the surface of right fallopian tube - Bilateral ovaries show evidence of psammomatous calcifications but carcinoma is not identified - No evidence of lymphovascular invasion - Benign unremarkable cervix - See oncology table - See comment  B. BLADDER PERITONEUM: - Involved by high-grade serous carcinoma with associated psammomatous calcifications  C. RIGHT INFERIOR PERICOLIC GUTTER PERITONEUM: - Involved by high-grade serous carcinoma with associated psammomatous calcifications  D. LEFT INFERIOR PERICOLIC GUTTER PERITONEUM: - Involved by high-grade serous carcinoma with associated psammomatous calcifications  E. OMENTUM: - Involved by high-grade serous carcinoma with associated psammomatous calcifications  F. JEJUNAL NODULE: - Psammomatous calcifications identified  COMMENT:  Dr. LeGolvan reviewed the case and concurs with the above diagnosis. Findings from the case were discussed with Dr. Eldonna on 05/10/2024.   ONCOLOGY TABLE:  1.  Endometrioid Carcinoma  UTERUS,  CARCINOMA OR CARCINOSARCOMA: Resection  Procedure: Total hysterectomy and bilateral salpingo-oophorectomy Histologic Type: Endometrioid carcinoma confined to endometrial polyp Histologic Grade: FIGO grade 1 Myometrial Invasion: No evidence of myometrial invasion Uterine Serosa Involvement: Not identified Cervical stromal Involvement: Not identified Extent of involvement of other tissue/organs: Not identified Peritoneal/Ascitic Fluid: Not applicable Lymphovascular Invasion: Not identified Regional Lymph Nodes: Not applicable (no lymph nodes submitted or found)  Distant Metastasis:      Distant Site(s) Involved: Not identified Pathologic Stage Classification (pTNM, AJCC 8th Edition): pT1a, pN not assigned Ancillary Studies: MMR testing will be ordered Representative Tumor Block: A7  (v4.2.0.1)   2.  High-grade serous carcinoma  OVARY or FALLOPIAN TUBE or PRIMARY PERITONEUM: Resection  Procedure: Total hysterectomy and bilateral salpingo-oophorectomy Specimen Integrity: Intact Tumor Site: Left fallopian tube Tumor Size: Approximately 0.4 cm on HE glass slide Histologic Type: Serous carcinoma Histologic Grade: High-grade Ovarian Surface Involvement: Bilateral ovarian surfaces show psammomatous calcifications Fallopian Tube Surface Involvement: Present Implants: Present - omentum, pericolic gutter, bladder peritoneum Lymphatic and/or Vascular Invasion: Not identified Other Tissue/ Organ Involvement: Not applicable Largest Extrapelvic Peritoneal Focus: Microscopic - 0.6 cm (on HE glass slide) Peritoneal/Ascitic Fluid Involvement: Not applicable Chemotherapy Response Score (CRS): CRS2 (moderate response) Regional Lymph Nodes: Not applicable (no lymph nodes submitted or found) Distant Metastasis:      Distant Site(s) Involved: Not applicable Pathologic Stage Classification (pTNM, AJCC 8th Edition): ypT3a, pN not assigned Ancillary Studies: Can be performed upon  request Representative Tumor Block: A13  (v1.3.0.1)

## 2024-05-22 NOTE — Progress Notes (Signed)
 Scanned document reviewed

## 2024-05-22 NOTE — Patient Instructions (Signed)
 It was a pleasure to see you in clinic today. - healing well from surgery - Resume chemo with Dr. Cloretta - Return visit planned for after completion of chemo  Thank you very much for allowing me to provide care for you today.  I appreciate your confidence in choosing our Gynecologic Oncology team at [facility].  If you have any questions about your visit today please call our office or send us  a MyChart message and we will get back to you as soon as possible.

## 2024-05-23 ENCOUNTER — Other Ambulatory Visit: Payer: Self-pay

## 2024-05-23 DIAGNOSIS — C541 Malignant neoplasm of endometrium: Secondary | ICD-10-CM

## 2024-05-23 LAB — SURGICAL PATHOLOGY

## 2024-05-24 ENCOUNTER — Other Ambulatory Visit: Payer: Self-pay | Admitting: Oncology

## 2024-05-24 DIAGNOSIS — C5700 Malignant neoplasm of unspecified fallopian tube: Secondary | ICD-10-CM

## 2024-05-25 ENCOUNTER — Other Ambulatory Visit: Payer: Self-pay

## 2024-05-30 ENCOUNTER — Inpatient Hospital Stay (HOSPITAL_BASED_OUTPATIENT_CLINIC_OR_DEPARTMENT_OTHER): Admitting: Nurse Practitioner

## 2024-05-30 ENCOUNTER — Inpatient Hospital Stay

## 2024-05-30 ENCOUNTER — Encounter: Payer: Self-pay | Admitting: Nurse Practitioner

## 2024-05-30 VITALS — BP 148/65 | HR 73 | Temp 97.8°F | Resp 18 | Ht 66.0 in | Wt 280.0 lb

## 2024-05-30 DIAGNOSIS — C5700 Malignant neoplasm of unspecified fallopian tube: Secondary | ICD-10-CM

## 2024-05-30 DIAGNOSIS — C541 Malignant neoplasm of endometrium: Secondary | ICD-10-CM

## 2024-05-30 DIAGNOSIS — R3915 Urgency of urination: Secondary | ICD-10-CM | POA: Diagnosis not present

## 2024-05-30 DIAGNOSIS — Z5111 Encounter for antineoplastic chemotherapy: Secondary | ICD-10-CM | POA: Diagnosis not present

## 2024-05-30 LAB — CMP (CANCER CENTER ONLY)
ALT: 14 U/L (ref 0–44)
AST: 17 U/L (ref 15–41)
Albumin: 4.1 g/dL (ref 3.5–5.0)
Alkaline Phosphatase: 114 U/L (ref 38–126)
Anion gap: 12 (ref 5–15)
BUN: 15 mg/dL (ref 8–23)
CO2: 26 mmol/L (ref 22–32)
Calcium: 10 mg/dL (ref 8.9–10.3)
Chloride: 101 mmol/L (ref 98–111)
Creatinine: 0.63 mg/dL (ref 0.44–1.00)
GFR, Estimated: >60 mL/min (ref >60)
Glucose, Bld: 125 mg/dL — ABNORMAL HIGH (ref 70–99)
Potassium: 3.9 mmol/L (ref 3.5–5.1)
Sodium: 139 mmol/L (ref 135–145)
Total Bilirubin: 0.4 mg/dL (ref 0.0–1.2)
Total Protein: 7.2 g/dL (ref 6.5–8.1)

## 2024-05-30 LAB — URINALYSIS, COMPLETE (UACMP) WITH MICROSCOPIC
Bilirubin Urine: NEGATIVE
Glucose, UA: NEGATIVE mg/dL
Hgb urine dipstick: NEGATIVE
Ketones, ur: NEGATIVE mg/dL
Nitrite: NEGATIVE
Protein, ur: NEGATIVE mg/dL
Specific Gravity, Urine: 1.024 (ref 1.005–1.030)
pH: 5 (ref 5.0–8.0)

## 2024-05-30 LAB — CBC WITH DIFFERENTIAL (CANCER CENTER ONLY)
Abs Immature Granulocytes: 0.01 K/uL (ref 0.00–0.07)
Basophils Absolute: 0 K/uL (ref 0.0–0.1)
Basophils Relative: 1 %
Eosinophils Absolute: 0.1 K/uL (ref 0.0–0.5)
Eosinophils Relative: 2 %
HCT: 31.4 % — ABNORMAL LOW (ref 36.0–46.0)
Hemoglobin: 10.7 g/dL — ABNORMAL LOW (ref 12.0–15.0)
Immature Granulocytes: 0 %
Lymphocytes Relative: 21 %
Lymphs Abs: 1.2 K/uL (ref 0.7–4.0)
MCH: 33.4 pg (ref 26.0–34.0)
MCHC: 34.1 g/dL (ref 30.0–36.0)
MCV: 98.1 fL (ref 80.0–100.0)
Monocytes Absolute: 0.3 K/uL (ref 0.1–1.0)
Monocytes Relative: 6 %
Neutro Abs: 3.8 K/uL (ref 1.7–7.7)
Neutrophils Relative %: 70 %
Platelet Count: 159 K/uL (ref 150–400)
RBC: 3.2 MIL/uL — ABNORMAL LOW (ref 3.87–5.11)
RDW: 12.8 % (ref 11.5–15.5)
WBC Count: 5.4 K/uL (ref 4.0–10.5)
nRBC: 0 % (ref 0.0–0.2)

## 2024-05-30 NOTE — Progress Notes (Signed)
 [facility] OFFICE PROGRESS NOTE   Diagnosis: Fallopian tube carcinoma  INTERVAL HISTORY:   [name] returns as scheduled.  She underwent debulking surgery 05/02/2024.  She feels overall she is recovering well.  She has intermittent low abdominal discomfort.  Bowels moving with the aid of MiraLAX  and Senokot.  She complains of urinary urgency.  She indicates this is a longstanding issue but may be slightly worse.  She has fairly consistent numbness/tingling/cold sensation in the toes.  She thinks symptoms are stable to slightly worse over the past month.  None in the fingertips.  More recent bilateral shin pain.  She wonders if the pain is neuropathy.  She feels her balance is off.  Objective:  Vital signs in last 24 hours:  Blood pressure (!) 148/65, pulse 73, temperature 97.8 F (36.6 C), temperature source Temporal, resp. rate 18, height 5' 6 (1.676 m), weight 280 lb (127 kg), SpO2 100%.    HEENT: No thrush or ulcers. Resp: Lungs clear bilaterally. Cardio: Regular rate and rhythm. GI: Healed surgical incisions.  Mild tenderness mid to low abdomen. Vascular: No leg edema. Neuro: Vibratory sense moderately decreased over the fingertips per tuning fork exam. Skin: No rash. Port-A-Cath without erythema.  Lab Results:  Lab Results  Component Value Date   WBC 5.4 05/30/2024   HGB 10.7 (L) 05/30/2024   HCT 31.4 (L) 05/30/2024   MCV 98.1 05/30/2024   PLT 159 05/30/2024   NEUTROABS 3.8 05/30/2024    Imaging:  No results found.  Medications: I have reviewed the patient's current medications.  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)-fallopian tube 12/27/2023: CT abdomen/pelvis-extensive abnormal diffuse stranding and nodularity of the omentum and mesentery, small volume ascites, small periumbilical hernia with soft tissue nodularity, enlarged aortocaval retroperitoneal node 01/05/2024: PET-hypermetabolic lymph nodes in the  mediastinum, bilateral internal mammary, and right pericardial areas, diffuse carcinomatosis, small hypermetabolic retroperitoneal nodes, no primary lesion identified 01/18/2024: CT-guided biopsy of left peritoneal tumor-metastatic high-grade serous carcinoma, PAX8 and WT1 positive Elevated CA125 Cycle 1 paclitaxel /carboplatin  02/02/2024, Udenyca  Cycle 2 paclitaxel /carboplatin  02/23/2024, Udenyca  Cycle 3 paclitaxel /carboplatin  03/15/2024, chemotherapy dose reduced due to thrombocytopenia and arthralgias; Udenyca  03/28/2024 CTs: No enlarged mediastinal nodes, no ascites, mild diffuse peritoneal thickening and nodularity-significantly improved, low-attenuation anterior liver lesion unchanged, new 3 mm left lower lobe nodule Cycle 4 paclitaxel /carboplatin  04/05/2024, Udenyca  05/02/2024 hysterectomy, bilateral salpingo-oophorectomy, radical dissection for tumor debulking with peritoneal stripping, mini laparotomy for omentectomy, cystoscopy (overall nearly complete gross resection with possible few scattered areas of miliary disease on the bowel mesentery and right diaphragm); pathology with 2 separate tumors: Endometrioid carcinoma, FIGO grade 1, 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; high-grade serous carcinoma involving left fallopian tube; endometrial carcinoma shows no evidence of myometrial invasion; focal tumor deposits seen on surface of the right fallopian tube; bilateral ovaries show evidence of psammomatous calcifications but no carcinoma identified; no evidence of lymphovascular invasion; benign unremarkable cervix; bladder peritoneum/right inferior paracolic gutter peritoneum/left inferior paracolic gutter peritoneum/omentum involved by high-grade serous carcinoma with associated psammomatous calcifications; jejunal nodule psammomatous calcifications Cycle 5 carboplatin  05/31/2024, Taxol  held due to neuropathy   05/02/2024 endometrial carcinoma, FIGO grade 1, approximately 4.4 cm, confined  to an endometrial polyp, no evidence of myometrial invasion  Abdominal pain secondary to 1 Anorexia/weight loss secondary to #1 Hypertension Arthritis Benign endocervix polyp 2019 Family history of breast, endometrial, lung, and head neck cancer-negative genetic testing 02/17/2024  Disposition: Susan Frederick appears stable.  Dr. Cloretta reviewed the pathology report  from the debulking surgery with her at today's visit.  She understands the recommendation is for 2-3 additional cycles of chemotherapy.  She seems to have recovered adequately  to resume treatment.  Plan to proceed tomorrow as scheduled, will hold paclitaxel  due to neuropathy.  Also hold udenyca .  CBC and chemistry panel reviewed.  Labs adequate for treatment.  Referral placed to physical therapy due to gait/balance issues.  She will return for follow-up and the next cycle of chemotherapy in 3 weeks.  We are available to see her sooner if needed.  Patient seen with Dr. Cloretta.    Susan Frederick ANP/GNP-BC   05/30/2024  12:27 PM  Addendum 4:28 PM-Susan Frederick sent a MyChart message after the visit regarding her hemoglobin.  The anemia is likely due to a combination of blood loss from surgery and an effect of chemotherapy.  We will continue to monitor.  LT   This was a shared visit with Susan Frederick.  Susan Frederick has recovered from the cytoreductive surgery.  We reviewed the pathology report with Susan Frederick.  The operative findings and pathology are consistent with a good response to chemotherapy.  The plan is to begin adjuvant chemotherapy tomorrow.  Paclitaxel  will be placed on hold due to neuropathy. We reviewed the preoperative PET and restaging CTs with Susan Frederick.  The significance of the small hypermetabolic chest nodes is unclear.  We will plan for a restaging chest CT in 3-6 months.  Arvella Cloretta, MD

## 2024-05-30 NOTE — Telephone Encounter (Signed)
 Forwarded note to NP

## 2024-05-31 ENCOUNTER — Other Ambulatory Visit: Payer: Self-pay

## 2024-05-31 ENCOUNTER — Inpatient Hospital Stay

## 2024-05-31 VITALS — BP 134/41 | HR 60 | Temp 97.7°F | Resp 18

## 2024-05-31 DIAGNOSIS — C5700 Malignant neoplasm of unspecified fallopian tube: Secondary | ICD-10-CM

## 2024-05-31 DIAGNOSIS — Z5111 Encounter for antineoplastic chemotherapy: Secondary | ICD-10-CM | POA: Diagnosis not present

## 2024-05-31 DIAGNOSIS — C482 Malignant neoplasm of peritoneum, unspecified: Secondary | ICD-10-CM

## 2024-05-31 LAB — URINE CULTURE

## 2024-05-31 MED ORDER — SODIUM CHLORIDE 0.9 % IV SOLN: INTRAVENOUS | Status: DC

## 2024-05-31 MED ORDER — DEXAMETHASONE SOD PHOSPHATE PF 10 MG/ML IJ SOLN
10.0000 mg | Freq: Once | INTRAMUSCULAR | Status: AC
  Administered 2024-05-31: 10 mg via INTRAVENOUS

## 2024-05-31 MED ORDER — PALONOSETRON HCL INJECTION 0.25 MG/5ML
0.2500 mg | Freq: Once | INTRAVENOUS | Status: AC
  Administered 2024-05-31: 0.25 mg via INTRAVENOUS
  Filled 2024-05-31: qty 5

## 2024-05-31 MED ORDER — SODIUM CHLORIDE 0.9 % IV SOLN
500.8000 mg | Freq: Once | INTRAVENOUS | Status: AC
  Administered 2024-05-31: 500 mg via INTRAVENOUS
  Filled 2024-05-31: qty 50

## 2024-05-31 MED ORDER — SODIUM CHLORIDE 0.9 % IV SOLN
150.0000 mg | Freq: Once | INTRAVENOUS | Status: AC
  Administered 2024-05-31: 150 mg via INTRAVENOUS
  Filled 2024-05-31: qty 150

## 2024-05-31 NOTE — Patient Instructions (Signed)
 [facility] - A DEPT OF [facility]  Discharge Instructions: Thank you for choosing [facility] to provide your oncology and hematology care.   If you have a lab appointment with the Cancer Center, please go directly to the Cancer Center and check in at the registration area.   Wear comfortable clothing and clothing appropriate for easy access to any Portacath or PICC line.   We strive to give you quality time with your provider. You may need to reschedule your appointment if you arrive late (15 or more minutes).  Arriving late affects you and other patients whose appointments are after yours.  Also, if you miss three or more appointments without notifying the office, you may be dismissed from the clinic at the provider's discretion.      For prescription refill requests, have your pharmacy contact our office and allow 72 hours for refills to be completed.    Today you received the following chemotherapy and/or immunotherapy agents: carboplatin .      To help prevent nausea and vomiting after your treatment, we encourage you to take your nausea medication as directed.  BELOW ARE SYMPTOMS THAT SHOULD BE REPORTED IMMEDIATELY: *FEVER GREATER THAN 100.4 F (38 C) OR HIGHER *CHILLS OR SWEATING *NAUSEA AND VOMITING THAT IS NOT CONTROLLED WITH YOUR NAUSEA MEDICATION *UNUSUAL SHORTNESS OF BREATH *UNUSUAL BRUISING OR BLEEDING *URINARY PROBLEMS (pain or burning when urinating, or frequent urination) *BOWEL PROBLEMS (unusual diarrhea, constipation, pain near the anus) TENDERNESS IN MOUTH AND THROAT WITH OR WITHOUT PRESENCE OF ULCERS (sore throat, sores in mouth, or a toothache) UNUSUAL RASH, SWELLING OR PAIN  UNUSUAL VAGINAL DISCHARGE OR ITCHING   Items with * indicate a potential emergency and should be followed up as soon as possible or go to the Emergency Department if any problems should occur.  Please show the CHEMOTHERAPY ALERT CARD or  IMMUNOTHERAPY ALERT CARD at check-in to the Emergency Department and triage nurse.  Should you have questions after your visit or need to cancel or reschedule your appointment, please contact [facility] - A DEPT OF [facility]  Dept: [phone]  and follow the prompts.  Office hours are 8:00 a.m. to 4:30 p.m. Monday - Friday. Please note that voicemails left after 4:00 p.m. may not be returned until the following business day.  We are closed weekends and major holidays. You have access to a nurse at all times for urgent questions. Please call the main number to the clinic Dept: [phone] and follow the prompts.   For any non-urgent questions, you may also contact your provider using MyChart. We now offer e-Visits for anyone 43 and older to request care online for non-urgent symptoms. For details visit [email or web address].   Also download the MyChart app! Go to the app store, search MyChart, open the app, select [facility], and log in with your MyChart username and password.

## 2024-05-31 NOTE — Progress Notes (Addendum)
 Patient seen by Olam Ned NP today  Vitals are within treatment parameters:Yes   Labs are within treatment parameters: Yes   Treatment plan has been signed: Yes   Per physician team, Patient is ready for treatment. Please note the following modifications: will hold paclitaxel  due to neuropathy.  Also hold udenyca . Chemo is on 05/31/2024

## 2024-06-01 LAB — CA 125: Cancer Antigen (CA) 125: 21.7 U/mL (ref 0.0–38.1)

## 2024-06-02 ENCOUNTER — Inpatient Hospital Stay

## 2024-06-05 ENCOUNTER — Encounter: Payer: Self-pay | Admitting: Nurse Practitioner

## 2024-06-07 ENCOUNTER — Encounter: Payer: Self-pay | Admitting: Physical Therapy

## 2024-06-07 ENCOUNTER — Other Ambulatory Visit: Payer: Self-pay

## 2024-06-07 ENCOUNTER — Ambulatory Visit: Attending: Nurse Practitioner | Admitting: Physical Therapy

## 2024-06-07 DIAGNOSIS — C482 Malignant neoplasm of peritoneum, unspecified: Secondary | ICD-10-CM | POA: Diagnosis not present

## 2024-06-07 DIAGNOSIS — R2689 Other abnormalities of gait and mobility: Secondary | ICD-10-CM | POA: Diagnosis present

## 2024-06-07 DIAGNOSIS — M6281 Muscle weakness (generalized): Secondary | ICD-10-CM | POA: Insufficient documentation

## 2024-06-07 DIAGNOSIS — R2681 Unsteadiness on feet: Secondary | ICD-10-CM | POA: Diagnosis present

## 2024-06-07 NOTE — Therapy (Signed)
 OUTPATIENT PHYSICAL THERAPY NEURO EVALUATION   Patient Name: Susan Frederick MRN: [id number] DOB:[birth year], 73 y.o., female Today's Date: 06/08/2024   PCP: Samie Frederick, PA-C REFERRING PROVIDER: Debby Olam POUR, NP   END OF SESSION:  PT End of Session - 06/08/24 1231     Visit Number 1    Number of Visits 17    Date for Recertification  08/04/24    Authorization Type [employer]-auth submitted    PT Start Time 1450    PT Stop Time 1530    PT Time Calculation (min) 40 min    Equipment Utilized During Treatment Gait belt    Activity Tolerance Patient tolerated treatment well    Behavior During Therapy WFL for tasks assessed/performed          Past Medical History:  Diagnosis Date   Arthritis    Cancer (HCC)    GYY,METS WITH OMENTUM   DDD (degenerative disc disease), lumbar    Diverticulosis of colon    Hematuria    Hypertension    Mixed stress and urge urinary incontinence    Obesity    OSA on CPAP    Pre-diabetes    Past Surgical History:  Procedure Laterality Date   ANKLE FUSION WITH GASTROC SLIDE Left 2010   COLONOSCOPY     CYSTOSCOPY N/A 05/02/2024   Procedure: CYSTOSCOPY;  Surgeon: Eldonna Mays, MD;  Location: [facility];  Service: Gynecology;  Laterality: N/A;   HAND TENDON SURGERY     right   HYSTERECTOMY,ROBOT,W/OMENTECT/SALPINGO-OOPHORECT FOR MALIGNANT NEOPLASM Bilateral 05/02/2024   Procedure: ROBOTIC ASSISTED TOTAL LAPAROSCOPIC HYSTERECTOMY WITH BILATERAL SALPINGO-OOPHORECTOMY, MINILAPAROTOMY FOR OMENTECTOMY, PERITONEAL STRIPPING FOR DEBULKING;  Surgeon: Eldonna Mays, MD;  Location: [facility];  Service: Gynecology;  Laterality: Bilateral;  ROBOTIC ASSISTED WITH DEBULKING   IR IMAGING GUIDED PORT INSERTION  01/18/2024   KNEE ARTHROSCOPY     left   NOSE SURGERY     deviated septum   POLYPECTOMY     ROOT CANAL  2025   x2   TOE SURGERY     right foot/ little toe   WISDOM TOOTH EXTRACTION  08/11/1967   Patient Active Problem List   Diagnosis Date  Noted   Endometrial cancer (HCC) 05/22/2024   Gynecologic malignancy (HCC) 05/02/2024   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  Have been getting more balance issues-not sure if it's neuropathy, arthritis, deconditioning.  Is to have several additional chemo treatments (every 3 weeks). Pt accompanied by: self  PERTINENT HISTORY: cancer (see PMH) s/p hysterectomy 05/02/2024 ;neuropathy from chemo, OA bilat knees  PAIN:  Are you having pain? Discomfort in mid section  PRECAUTIONS: Fall and Other: no lifting >10#, no lifting/straining pushing and pulling for 6 weeks post surgery (05/02/2024)  RED FLAGS: None   WEIGHT BEARING RESTRICTIONS: No  FALLS: Has patient fallen in last 6 months? No  LIVING ENVIRONMENT: Lives with: lives alone Lives in: House/apartment Stairs: 1 step, then 1 step to enter Has following equipment at home: Single point cane, Walker - 2 wheeled, and walking sticks, were sister's equipment  PLOF: Independentshort walks in the neighborhood, enjoyed water  aerobics  PATIENT GOALS: To address the issues with neuropathy  OBJECTIVE:  Note: Objective measures were completed at Evaluation  unless otherwise noted.  DIAGNOSTIC FINDINGS: NA for this PT episode  COGNITION: Overall cognitive status: Within functional limits for tasks assessed   SENSATION: Light touch: Impaired  and distally Reports that balls of feet forward feel cold  POSTURE: rounded shoulders  LOWER EXTREMITY ROM:   AROM WFL  LOWER EXTREMITY MMT:    MMT Right Eval Left Eval  Hip flexion 4 4  Hip extension    Hip abduction 4 4  Hip adduction 4 4  Hip internal rotation    Hip external rotation    Knee flexion 4 4  Knee extension 4 4  Ankle dorsiflexion 3+ 3+  Ankle plantarflexion    Ankle inversion    Ankle eversion    (Blank rows = not tested)  TRANSFERS: Sit to stand: Modified independence  Assistive device utilized: BUE support     Stand to sit: Modified independence  Assistive device utilized: BUE support      GAIT: Findings: Gait Characteristics: step through pattern and wide BOS, Distance walked: 50 ft, Assistive device utilized:None, and Level of assistance: Modified independence and SBA  FUNCTIONAL TESTS: Timed up and go (TUG): 9.5 sec 10 meter walk test: 16.72 sec (1.96 ft/sec) DGI:  11/24 (<19/24 indicates increased fall risk)  [facility] PT Assessment - 06/07/24 1521       Standardized Balance Assessment   Standardized Balance Assessment Dynamic Gait Index      Dynamic Gait Index   Level Surface Mild Impairment   9.5 sec   Change in Gait Speed Mild Impairment    Gait with Horizontal Head Turns Moderate Impairment   12.5 sec   Gait with Vertical Head Turns Moderate Impairment   12.41   Gait and Pivot Turn Normal    Step Over Obstacle Severe Impairment    Step Around Obstacles Moderate Impairment    Steps Moderate Impairment   pre-morbid based on knee pain   Total Score 11          M-CTSIB  Condition 1: Firm Surface, EO  30 Sec, Mild Sway  Condition 2: Firm Surface, EC 30 Sec, Mild and Moderate Sway  Condition 3: Foam Surface, EO 30 Sec, Mild and Moderate Sway   Condition 4: Foam Surface, EC 30 Sec, Moderate and Severe Sway  TREATMENT DATE: 06/07/2024    PATIENT EDUCATION: Education details: Eval results, POC Person educated: Patient Education method: Explanation Education comprehension: verbalized understanding  HOME EXERCISE PROGRAM: TBD  GOALS: Goals reviewed with patient? Yes  SHORT TERM GOALS: Target date: 07/07/2024  Pt will be independent with HEP for improved strength, balance, gait. Baseline: Goal status: INITIAL  2.  Pt will improve gait velocity to at least 2.62 ft/sec for improved gait efficiency and safety. Baseline:  Goal status: INITIAL  3.  MCTSIB Condition 4 to improve to 30 sec mod or less sway for improved balance. Baseline:  Goal status: INITIAL   LONG TERM GOALS: Target date: 08/04/2024  Pt will be independent with HEP for improved balance, gait, strength. Baseline:  Goal status: INITIAL  2.  Pt will improve DGI score to at least 20/24 to decrease fall risk. Baseline: 11/24 Goal status: INITIAL  3.  Pt will verbalize understanding of fall prevention in home environment. Baseline:  Goal status: INITIAL  ASSESSMENT:  CLINICAL IMPRESSION: Patient is a 73 y.o. female who was seen today for physical therapy evaluation and treatment for balance and gait issues with hx of Primary peritoneal carcinomatosis.  She has undergone several rounds of chemo and notes neuropathy with sensation changes on the tops of her feet.  She does note a general decrease in activity level and exercise in the past year, prior to her cancer diagnosis and surgery.  She underwent hysterectomy 05/02/2024 and has several more rounds of chemo to complete.  She presents today with decreased strength, decreased balance, decreased steadiness with gait and mobility.  She is at increased fall risk per DGI score of  11/24 and has decreased balance on with EC on Condition 4 of MCTSIB.  She will benefit from skilled PT to address the above stated deficits to decrease fall risk and improve overall functional mobility.    OBJECTIVE IMPAIRMENTS: Abnormal gait, decreased balance, decreased mobility, difficulty walking, decreased strength, and impaired sensation.   ACTIVITY LIMITATIONS: transfers and locomotion level  PARTICIPATION LIMITATIONS: community activity and fitness  PERSONAL FACTORS: 3+ comorbidities: see above are also affecting patient's functional outcome.   REHAB POTENTIAL: Good  CLINICAL DECISION MAKING: Stable/uncomplicated  EVALUATION COMPLEXITY: Low  PLAN:  PT FREQUENCY: 2x/week  PT DURATION: 8 weeks plus eval  PLANNED INTERVENTIONS: 97750- Physical Performance Testing, 97110-Therapeutic exercises, 97530- Therapeutic activity, 97112- Neuromuscular re-education, 97535- Self Care, 02859- Manual therapy, [phone]- Gait training, Patient/Family education, and Balance training  PLAN FOR NEXT SESSION: Initiate HEP for balance, ankle and hip strategy work, strength, tourist information centre manager   STARLET GREIG ORN., PT 06/08/2024, 12:32 PM  [facility] [address] [place], [place] [address] Phone # [phone] Fax # [phone]  Referring diagnosis? C48.2 (ICD-10-CM) - Primary peritoneal carcinomatosis (HCC) ; gait/balance noted in MD notes Treatment diagnosis? (if different than referring diagnosis) R26.81, M62.81, R 26.89 What was this (referring dx) caused by? [x]  Surgery []  Fall []  Ongoing issue []  Arthritis [x]  Other: __gait/balance issues/neuropathy following chemotherapy__________  Laterality: []  Rt []  Lt [x]  Both  Check all possible CPT codes:  *CHOOSE 10 OR LESS*    See Planned Interventions listed in the Plan section of the Evaluation.

## 2024-06-08 ENCOUNTER — Other Ambulatory Visit: Payer: Self-pay

## 2024-06-08 ENCOUNTER — Telehealth: Payer: Self-pay | Admitting: *Deleted

## 2024-06-08 NOTE — Telephone Encounter (Signed)
 Notified patient that UA collected on 10/27 by PCP does not appear highly suspicious for UTI since nitrates were negative. She admits a past H/O hematuria, but none visual. She denies having a ureteral stent. Suggested she reach out to PCP to inquire if they wish to pursue further work up on urine.

## 2024-06-09 ENCOUNTER — Other Ambulatory Visit (HOSPITAL_BASED_OUTPATIENT_CLINIC_OR_DEPARTMENT_OTHER): Payer: Self-pay

## 2024-06-09 MED ORDER — CEPHALEXIN 500 MG PO CAPS
500.0000 mg | ORAL_CAPSULE | Freq: Three times a day (TID) | ORAL | 0 refills | Status: DC
  Filled 2024-06-09: qty 21, 7d supply, fill #0

## 2024-06-14 ENCOUNTER — Other Ambulatory Visit: Payer: Self-pay | Admitting: Oncology

## 2024-06-15 ENCOUNTER — Other Ambulatory Visit: Payer: Self-pay | Admitting: Oncology

## 2024-06-16 NOTE — Therapy (Signed)
 OUTPATIENT PHYSICAL THERAPY NEURO TREATMENT   Patient Name: Susan Frederick MRN: [id number] DOB:[birth year], 73 y.o., female Today's Date: 06/19/2024   PCP: Samie Frederick, PA-C REFERRING PROVIDER: Debby Olam POUR, NP   END OF SESSION:  PT End of Session - 06/19/24 1013     Visit Number 2    Number of Visits 17    Date for Recertification  08/04/24    Authorization Type [employer]    Authorization Time Period approved 17 PT visits from 06/07/2024 - 08/04/2024    Authorization - Visit Number 2    Authorization - Number of Visits 17    PT Start Time 0933    PT Stop Time 1014    PT Time Calculation (min) 41 min    Equipment Utilized During Treatment --    Activity Tolerance Patient tolerated treatment well    Behavior During Therapy WFL for tasks assessed/performed           Past Medical History:  Diagnosis Date   Arthritis    Cancer (HCC)    GYY,METS WITH OMENTUM   DDD (degenerative disc disease), lumbar    Diverticulosis of colon    Hematuria    Hypertension    Mixed stress and urge urinary incontinence    Obesity    OSA on CPAP    Pre-diabetes    Past Surgical History:  Procedure Laterality Date   ANKLE FUSION WITH GASTROC SLIDE Left 2010   COLONOSCOPY     CYSTOSCOPY N/A 05/02/2024   Procedure: CYSTOSCOPY;  Surgeon: Eldonna Mays, MD;  Location: [facility];  Service: Gynecology;  Laterality: N/A;   HAND TENDON SURGERY     right   HYSTERECTOMY,ROBOT,W/OMENTECT/SALPINGO-OOPHORECT FOR MALIGNANT NEOPLASM Bilateral 05/02/2024   Procedure: ROBOTIC ASSISTED TOTAL LAPAROSCOPIC HYSTERECTOMY WITH BILATERAL SALPINGO-OOPHORECTOMY, MINILAPAROTOMY FOR OMENTECTOMY, PERITONEAL STRIPPING FOR DEBULKING;  Surgeon: Eldonna Mays, MD;  Location: [facility];  Service: Gynecology;  Laterality: Bilateral;  ROBOTIC ASSISTED WITH DEBULKING   IR IMAGING GUIDED PORT INSERTION  01/18/2024   KNEE ARTHROSCOPY     left   NOSE SURGERY     deviated septum   POLYPECTOMY     ROOT CANAL  2025    x2   TOE SURGERY     right foot/ little toe   WISDOM TOOTH EXTRACTION  08/11/1967   Patient Active Problem List   Diagnosis Date Noted   Endometrial cancer (HCC) 05/22/2024   Gynecologic malignancy (HCC) 05/02/2024   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   Pt accompanied by: self  PERTINENT HISTORY: cancer (see PMH) s/p hysterectomy 05/02/2024 ;neuropathy from chemo, OA bilat knees  PAIN:  Are you having pain? Yes: NPRS scale: 5/010 Pain location: B knees  Pain description: sore Aggravating factors: in the AM, weather Relieving factors: stretching   PRECAUTIONS: Fall and Other: no lifting >10#, no lifting/straining pushing and pulling for 6 weeks post surgery (05/02/2024)  RED FLAGS: None   WEIGHT BEARING RESTRICTIONS: No  FALLS: Has patient fallen in last 6 months? No  LIVING ENVIRONMENT: Lives with: lives alone Lives in: House/apartment Stairs: 1 step, then 1 step to enter Has following equipment at home: Single point cane, Walker - 2 wheeled, and walking sticks, were sister's equipment  PLOF: Independentshort walks in the neighborhood, enjoyed water  aerobics  PATIENT GOALS: To address the issues with  neuropathy  OBJECTIVE:      TODAY'S TREATMENT: 06/19/24 Activity Comments  Nustep L4 x 6 min UEs/LEs  Dynamic warm up for knees   standing heel/toe raise  Runner's stretch 30 each  standing march 3# 2x20   sidestepping over hurdles   At TM rail for safety. Cues to avoid hip compensation with DF.  More limited L knee flexion evident d/t stiffness. Weaned to fingertip support with stepping over hurdles.   Pt reported increased knee soreness after standing activities, thus provided short sit break    romberg EO/EC romberg + head turns/nods Romberg on foam standing on foam EC Mild sway with EC. Good stability with head movements.      HOME EXERCISE PROGRAM Last updated: 06/19/24 Access Code: [email or web address] URL: [email or web address] Date: 06/19/2024 Prepared by: [facility] - [facility]  Program Notes perform standing activities at counter for safety  Exercises - Heel Toe Raises with Counter Support  - 1 x daily - 5 x weekly - 2 sets - 10 reps - Standing Gastroc Stretch at Counter  - 1 x daily - 5 x weekly - 2 sets - 30 sec hold - Marching with Resistance  - 1 x daily - 5 x weekly - 2 sets - 20 reps   PATIENT EDUCATION: Education details: HEP update with edu for safety Person educated: Patient Education method: Explanation, Demonstration, Tactile cues, Verbal cues, and Handouts Education comprehension: verbalized understanding and returned demonstration    Note: Objective measures were completed at Evaluation unless otherwise noted.  DIAGNOSTIC FINDINGS: NA for this PT episode  COGNITION: Overall cognitive status: Within functional limits for tasks assessed   SENSATION: Light touch: Impaired  and distally Reports that balls of feet forward feel cold  POSTURE: rounded shoulders  LOWER EXTREMITY ROM:   AROM WFL  LOWER EXTREMITY MMT:    MMT Right Eval Left Eval  Hip flexion 4 4  Hip extension    Hip abduction 4 4  Hip  adduction 4 4  Hip internal rotation    Hip external rotation    Knee flexion 4 4  Knee extension 4 4  Ankle dorsiflexion 3+ 3+  Ankle plantarflexion    Ankle inversion    Ankle eversion    (Blank rows = not tested)  TRANSFERS: Sit to stand: Modified independence  Assistive device utilized: BUE support     Stand to sit: Modified independence  Assistive device utilized: BUE support      GAIT: Findings: Gait Characteristics: step through pattern and wide BOS, Distance  walked: 50 ft, Assistive device utilized:None, and Level of assistance: Modified independence and SBA  FUNCTIONAL TESTS: Timed up and go (TUG): 9.5 sec 10 meter walk test: 16.72 sec (1.96 ft/sec) DGI:  11/24 (<19/24 indicates increased fall risk)    M-CTSIB  Condition 1: Firm Surface, EO  30 Sec, Mild Sway  Condition 2: Firm Surface, EC 30 Sec, Mild and Moderate Sway  Condition 3: Foam Surface, EO 30 Sec, Mild and Moderate Sway  Condition 4: Foam Surface, EC 30 Sec, Moderate and Severe Sway                                                                                                                                   TREATMENT DATE: 06/07/2024    PATIENT EDUCATION: Education details: Eval results, POC Person educated: Patient Education method: Explanation Education comprehension: verbalized understanding  HOME EXERCISE PROGRAM: TBD  GOALS: Goals reviewed with patient? Yes  SHORT TERM GOALS: Target date: 07/07/2024  Pt will be independent with HEP for improved strength, balance, gait. Baseline: Goal status: INITIAL  2.  Pt will improve gait velocity to at least 2.62 ft/sec for improved gait efficiency and safety. Baseline:  Goal status: INITIAL  3.  MCTSIB Condition 4 to improve to 30 sec mod or less sway for improved balance. Baseline:  Goal status: INITIAL   LONG TERM GOALS: Target date: 08/04/2024  Pt will be independent with HEP for improved balance, gait, strength. Baseline:  Goal  status: INITIAL  2.  Pt will improve DGI score to at least 20/24 to decrease fall risk. Baseline: 11/24 Goal status: INITIAL  3.  Pt will verbalize understanding of fall prevention in home environment. Baseline:  Goal status: INITIAL  ASSESSMENT:  CLINICAL IMPRESSION: Patient arrived to session with report of slightly increased B knee pain. Session focused on trialing strengthening and dynamic and static balance exercises to initiate HEP. Patient benefited from demo and cueing for proper form and required short rest break to alleviate knee pain after period of standing. Patient tolerated session well and without complaints upon leaving.   OBJECTIVE IMPAIRMENTS: Abnormal gait, decreased balance, decreased mobility, difficulty walking, decreased strength, and impaired sensation.   ACTIVITY LIMITATIONS: transfers and locomotion level  PARTICIPATION LIMITATIONS: community activity and fitness  PERSONAL FACTORS: 3+ comorbidities: see above are also affecting patient's functional outcome.   REHAB POTENTIAL: Good  CLINICAL DECISION MAKING: Stable/uncomplicated  EVALUATION COMPLEXITY: Low  PLAN:  PT FREQUENCY: 2x/week  PT DURATION: 8 weeks plus eval  PLANNED INTERVENTIONS: 97750- Physical Performance Testing, 97110-Therapeutic exercises, 97530- Therapeutic activity, 97112- Neuromuscular re-education, 97535- Self Care, 02859- Manual therapy, [phone]- Gait training, Patient/Family education, and Balance training  PLAN FOR NEXT SESSION: review HEP for balance, ankle and hip strategy work, strength, multi-sensory balance  Louana Terrilyn Christians, PT, DPT 06/19/24 10:16 AM  [facility] [address] [place], [place] [address] Phone # [phone])  [phone] Fax # [phone]

## 2024-06-17 ENCOUNTER — Other Ambulatory Visit (HOSPITAL_BASED_OUTPATIENT_CLINIC_OR_DEPARTMENT_OTHER): Payer: Self-pay

## 2024-06-17 ENCOUNTER — Encounter (HOSPITAL_BASED_OUTPATIENT_CLINIC_OR_DEPARTMENT_OTHER): Payer: Self-pay

## 2024-06-19 ENCOUNTER — Telehealth: Payer: Self-pay | Admitting: Nurse Practitioner

## 2024-06-19 ENCOUNTER — Other Ambulatory Visit (HOSPITAL_BASED_OUTPATIENT_CLINIC_OR_DEPARTMENT_OTHER): Payer: Self-pay

## 2024-06-19 ENCOUNTER — Ambulatory Visit: Attending: Nurse Practitioner | Admitting: Physical Therapy

## 2024-06-19 ENCOUNTER — Encounter: Payer: Self-pay | Admitting: Physical Therapy

## 2024-06-19 DIAGNOSIS — R2689 Other abnormalities of gait and mobility: Secondary | ICD-10-CM | POA: Diagnosis present

## 2024-06-19 DIAGNOSIS — M6281 Muscle weakness (generalized): Secondary | ICD-10-CM | POA: Insufficient documentation

## 2024-06-19 DIAGNOSIS — R2681 Unsteadiness on feet: Secondary | ICD-10-CM | POA: Diagnosis present

## 2024-06-19 MED ORDER — SERTRALINE HCL 50 MG PO TABS
50.0000 mg | ORAL_TABLET | Freq: Every day | ORAL | 1 refills | Status: AC
  Filled 2024-06-19: qty 90, 90d supply, fill #0
  Filled 2024-09-13: qty 90, 90d supply, fill #1

## 2024-06-19 NOTE — Telephone Encounter (Signed)
 PT wants to cancel, wants to call back and reschedule.

## 2024-06-20 ENCOUNTER — Inpatient Hospital Stay

## 2024-06-20 ENCOUNTER — Inpatient Hospital Stay: Admitting: Nurse Practitioner

## 2024-06-20 ENCOUNTER — Encounter: Payer: Self-pay | Admitting: Nurse Practitioner

## 2024-06-20 ENCOUNTER — Other Ambulatory Visit (HOSPITAL_BASED_OUTPATIENT_CLINIC_OR_DEPARTMENT_OTHER): Payer: Self-pay

## 2024-06-20 ENCOUNTER — Inpatient Hospital Stay: Attending: Physician Assistant

## 2024-06-20 ENCOUNTER — Inpatient Hospital Stay (HOSPITAL_BASED_OUTPATIENT_CLINIC_OR_DEPARTMENT_OTHER): Admitting: Nurse Practitioner

## 2024-06-20 VITALS — BP 159/58 | HR 74 | Temp 97.9°F | Resp 18 | Ht 66.0 in | Wt 291.0 lb

## 2024-06-20 DIAGNOSIS — Z803 Family history of malignant neoplasm of breast: Secondary | ICD-10-CM | POA: Insufficient documentation

## 2024-06-20 DIAGNOSIS — Z8049 Family history of malignant neoplasm of other genital organs: Secondary | ICD-10-CM | POA: Insufficient documentation

## 2024-06-20 DIAGNOSIS — Z90722 Acquired absence of ovaries, bilateral: Secondary | ICD-10-CM | POA: Diagnosis not present

## 2024-06-20 DIAGNOSIS — R971 Elevated cancer antigen 125 [CA 125]: Secondary | ICD-10-CM | POA: Insufficient documentation

## 2024-06-20 DIAGNOSIS — K769 Liver disease, unspecified: Secondary | ICD-10-CM | POA: Diagnosis not present

## 2024-06-20 DIAGNOSIS — R634 Abnormal weight loss: Secondary | ICD-10-CM | POA: Diagnosis not present

## 2024-06-20 DIAGNOSIS — Z90710 Acquired absence of both cervix and uterus: Secondary | ICD-10-CM | POA: Diagnosis not present

## 2024-06-20 DIAGNOSIS — G629 Polyneuropathy, unspecified: Secondary | ICD-10-CM | POA: Diagnosis not present

## 2024-06-20 DIAGNOSIS — Z801 Family history of malignant neoplasm of trachea, bronchus and lung: Secondary | ICD-10-CM | POA: Diagnosis not present

## 2024-06-20 DIAGNOSIS — C541 Malignant neoplasm of endometrium: Secondary | ICD-10-CM | POA: Insufficient documentation

## 2024-06-20 DIAGNOSIS — D696 Thrombocytopenia, unspecified: Secondary | ICD-10-CM | POA: Diagnosis not present

## 2024-06-20 DIAGNOSIS — C5702 Malignant neoplasm of left fallopian tube: Secondary | ICD-10-CM | POA: Insufficient documentation

## 2024-06-20 DIAGNOSIS — M1990 Unspecified osteoarthritis, unspecified site: Secondary | ICD-10-CM | POA: Diagnosis not present

## 2024-06-20 DIAGNOSIS — C5700 Malignant neoplasm of unspecified fallopian tube: Secondary | ICD-10-CM | POA: Diagnosis not present

## 2024-06-20 DIAGNOSIS — N390 Urinary tract infection, site not specified: Secondary | ICD-10-CM | POA: Insufficient documentation

## 2024-06-20 DIAGNOSIS — C786 Secondary malignant neoplasm of retroperitoneum and peritoneum: Secondary | ICD-10-CM | POA: Insufficient documentation

## 2024-06-20 DIAGNOSIS — I10 Essential (primary) hypertension: Secondary | ICD-10-CM | POA: Diagnosis not present

## 2024-06-20 DIAGNOSIS — Z5111 Encounter for antineoplastic chemotherapy: Secondary | ICD-10-CM | POA: Insufficient documentation

## 2024-06-20 DIAGNOSIS — R630 Anorexia: Secondary | ICD-10-CM | POA: Diagnosis not present

## 2024-06-20 DIAGNOSIS — G893 Neoplasm related pain (acute) (chronic): Secondary | ICD-10-CM | POA: Diagnosis not present

## 2024-06-20 LAB — CBC WITH DIFFERENTIAL (CANCER CENTER ONLY)
Abs Immature Granulocytes: 0.01 K/uL (ref 0.00–0.07)
Basophils Absolute: 0 K/uL (ref 0.0–0.1)
Basophils Relative: 1 %
Eosinophils Absolute: 0.1 K/uL (ref 0.0–0.5)
Eosinophils Relative: 1 %
HCT: 28.9 % — ABNORMAL LOW (ref 36.0–46.0)
Hemoglobin: 10 g/dL — ABNORMAL LOW (ref 12.0–15.0)
Immature Granulocytes: 0 %
Lymphocytes Relative: 32 %
Lymphs Abs: 1.3 K/uL (ref 0.7–4.0)
MCH: 33.6 pg (ref 26.0–34.0)
MCHC: 34.6 g/dL (ref 30.0–36.0)
MCV: 97 fL (ref 80.0–100.0)
Monocytes Absolute: 0.3 K/uL (ref 0.1–1.0)
Monocytes Relative: 6 %
Neutro Abs: 2.5 K/uL (ref 1.7–7.7)
Neutrophils Relative %: 60 %
Platelet Count: 93 K/uL — ABNORMAL LOW (ref 150–400)
RBC: 2.98 MIL/uL — ABNORMAL LOW (ref 3.87–5.11)
RDW: 12.9 % (ref 11.5–15.5)
WBC Count: 4.2 K/uL (ref 4.0–10.5)
nRBC: 0 % (ref 0.0–0.2)

## 2024-06-20 LAB — CMP (CANCER CENTER ONLY)
ALT: 16 U/L (ref 0–44)
AST: 16 U/L (ref 15–41)
Albumin: 4.1 g/dL (ref 3.5–5.0)
Alkaline Phosphatase: 125 U/L (ref 38–126)
Anion gap: 10 (ref 5–15)
BUN: 15 mg/dL (ref 8–23)
CO2: 26 mmol/L (ref 22–32)
Calcium: 9.5 mg/dL (ref 8.9–10.3)
Chloride: 103 mmol/L (ref 98–111)
Creatinine: 0.59 mg/dL (ref 0.44–1.00)
GFR, Estimated: >60 mL/min (ref >60)
Glucose, Bld: 115 mg/dL — ABNORMAL HIGH (ref 70–99)
Potassium: 3.8 mmol/L (ref 3.5–5.1)
Sodium: 139 mmol/L (ref 135–145)
Total Bilirubin: 0.4 mg/dL (ref 0.0–1.2)
Total Protein: 6.7 g/dL (ref 6.5–8.1)

## 2024-06-20 MED ORDER — LISINOPRIL 20 MG PO TABS
20.0000 mg | ORAL_TABLET | Freq: Every day | ORAL | 3 refills | Status: AC
  Filled 2024-06-20: qty 90, 90d supply, fill #0
  Filled 2024-09-13: qty 90, 90d supply, fill #1

## 2024-06-20 NOTE — Progress Notes (Signed)
 Patient seen by Olam Ned NP today  Vitals are within treatment parameters:Yes   Labs are within treatment parameters: No (Please specify and give further instructions.) Platelets 93  Treatment plan has been signed: Yes   Per physician team, Patient is ready for treatment. Please note the following modifications: Taxol  hold and Carboplatin  dose reduced due to thrombocytopenia . Chemo is schedule on 06/21/2024

## 2024-06-20 NOTE — Patient Instructions (Signed)

## 2024-06-20 NOTE — Progress Notes (Signed)
 [facility] OFFICE PROGRESS NOTE   Diagnosis: Fallopian tube carcinoma  INTERVAL HISTORY:   Susan Frederick returns as scheduled.  She completed a cycle of carboplatin  05/31/2024.  She denies nausea/vomiting.  No mouth sores.  No diarrhea.  Feet continue to feel cold.  She has numbness in the toes.  No symptoms in the hands.  Balance is still an issue.  She is unsure if this is due to neuropathy or arthritis involving the knees.  She recently completed an antibiotic for a UTI.  Objective:  Vital signs in last 24 hours:  Blood pressure (!) 159/58, pulse 74, temperature 97.9 F (36.6 C), temperature source Temporal, resp. rate 18, height 5' 6 (1.676 m), weight 291 lb (132 kg), SpO2 100%.    HEENT: No thrush or ulcers. Resp: Lungs clear bilaterally. Cardio: Regular rate and rhythm. GI: No hepatosplenomegaly.  Nontender. Vascular: No leg edema. Neuro: She had difficulty with the tuning fork exam for vibratory sense. Skin: No rash. Port-A-Cath without erythema.  Lab Results:  Lab Results  Component Value Date   WBC 4.2 06/20/2024   HGB 10.0 (L) 06/20/2024   HCT 28.9 (L) 06/20/2024   MCV 97.0 06/20/2024   PLT 93 (L) 06/20/2024   NEUTROABS 2.5 06/20/2024    Imaging:  No results found.  Medications: I have reviewed the patient's current medications.  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)-fallopian tube 12/27/2023: CT abdomen/pelvis-extensive abnormal diffuse stranding and nodularity of the omentum and mesentery, small volume ascites, small periumbilical hernia with soft tissue nodularity, enlarged aortocaval retroperitoneal node 01/05/2024: PET-hypermetabolic lymph nodes in the mediastinum, bilateral internal mammary, and right pericardial areas, diffuse carcinomatosis, small hypermetabolic retroperitoneal nodes, no primary lesion identified 01/18/2024: CT-guided biopsy of left peritoneal tumor-metastatic high-grade serous carcinoma, PAX8  and WT1 positive Elevated CA125 Cycle 1 paclitaxel /carboplatin  02/02/2024, Udenyca  Cycle 2 paclitaxel /carboplatin  02/23/2024, Udenyca  Cycle 3 paclitaxel /carboplatin  03/15/2024, chemotherapy dose reduced due to thrombocytopenia and arthralgias; Udenyca  03/28/2024 CTs: No enlarged mediastinal nodes, no ascites, mild diffuse peritoneal thickening and nodularity-significantly improved, low-attenuation anterior liver lesion unchanged, new 3 mm left lower lobe nodule Cycle 4 paclitaxel /carboplatin  04/05/2024, Udenyca  05/02/2024 hysterectomy, bilateral salpingo-oophorectomy, radical dissection for tumor debulking with peritoneal stripping, mini laparotomy for omentectomy, cystoscopy (overall nearly complete gross resection with possible few scattered areas of miliary disease on the bowel mesentery and right diaphragm); pathology with 2 separate tumors: Endometrioid carcinoma, FIGO grade 1, 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; high-grade serous carcinoma involving left fallopian tube; endometrial carcinoma shows no evidence of myometrial invasion; focal tumor deposits seen on surface of the right fallopian tube; bilateral ovaries show evidence of psammomatous calcifications but no carcinoma identified; no evidence of lymphovascular invasion; benign unremarkable cervix; bladder peritoneum/right inferior paracolic gutter peritoneum/left inferior paracolic gutter peritoneum/omentum involved by high-grade serous carcinoma with associated psammomatous calcifications; jejunal nodule psammomatous calcifications Cycle 5 carboplatin  05/31/2024, Taxol  held due to neuropathy Cycle 6 carboplatin  06/21/2024, carboplatin  dose reduced due to thrombocytopenia, Taxol  held due to neuropathy   05/02/2024 endometrial carcinoma, FIGO grade 1, approximately 4.4 cm, confined to an endometrial polyp, no evidence of myometrial invasion  Abdominal pain secondary to 1 Anorexia/weight loss secondary to  #1 Hypertension Arthritis Benign endocervix polyp 2019 Family history of breast, endometrial, lung, and head neck cancer-negative genetic testing 02/17/2024  Disposition: Susan Frederick appears stable.  She completed a cycle of carboplatin  05/31/2024.  Taxol  was held due to neuropathy.  She has persistent neuropathy symptoms.  We decided to proceed with carboplatin   alone 06/21/2024, continue to hold Taxol .  She agrees with this plan.  Carboplatin  will be dose reduced due to thrombocytopenia.  She understands to contact the office with bleeding.  Check nadir CBC.  She will return for follow-up and the next cycle of chemotherapy in 3 weeks.  We are available to see her sooner if needed.  Plan reviewed with Dr. Cloretta.    Olam Ned ANP/GNP-BC   06/20/2024  2:00 PM

## 2024-06-21 ENCOUNTER — Other Ambulatory Visit: Payer: Self-pay | Admitting: Oncology

## 2024-06-21 ENCOUNTER — Encounter: Payer: Self-pay | Admitting: Nurse Practitioner

## 2024-06-21 ENCOUNTER — Inpatient Hospital Stay

## 2024-06-21 ENCOUNTER — Encounter: Payer: Self-pay | Admitting: Psychiatry

## 2024-06-21 VITALS — BP 129/43 | HR 57 | Temp 97.9°F | Resp 18

## 2024-06-21 DIAGNOSIS — C5700 Malignant neoplasm of unspecified fallopian tube: Secondary | ICD-10-CM

## 2024-06-21 DIAGNOSIS — Z5111 Encounter for antineoplastic chemotherapy: Secondary | ICD-10-CM | POA: Diagnosis not present

## 2024-06-21 MED ORDER — SODIUM CHLORIDE 0.9 % IV SOLN
150.0000 mg | Freq: Once | INTRAVENOUS | Status: AC
  Administered 2024-06-21: 150 mg via INTRAVENOUS
  Filled 2024-06-21: qty 150

## 2024-06-21 MED ORDER — SODIUM CHLORIDE 0.9 % IV SOLN: INTRAVENOUS | Status: DC

## 2024-06-21 MED ORDER — SODIUM CHLORIDE 0.9 % IV SOLN
375.6000 mg | Freq: Once | INTRAVENOUS | Status: AC
  Administered 2024-06-21: 380 mg via INTRAVENOUS
  Filled 2024-06-21: qty 38

## 2024-06-21 MED ORDER — PALONOSETRON HCL INJECTION 0.25 MG/5ML
0.2500 mg | Freq: Once | INTRAVENOUS | Status: AC
  Administered 2024-06-21: 0.25 mg via INTRAVENOUS
  Filled 2024-06-21: qty 5

## 2024-06-21 MED ORDER — DEXAMETHASONE SOD PHOSPHATE PF 10 MG/ML IJ SOLN
10.0000 mg | Freq: Once | INTRAMUSCULAR | Status: AC
  Administered 2024-06-21: 10 mg via INTRAVENOUS

## 2024-06-21 NOTE — Patient Instructions (Signed)
 [facility] - A DEPT OF [facility]  Discharge Instructions: Thank you for choosing [facility] to provide your oncology and hematology care.   If you have a lab appointment with the Cancer Center, please go directly to the Cancer Center and check in at the registration area.   Wear comfortable clothing and clothing appropriate for easy access to any Portacath or PICC line.   We strive to give you quality time with your provider. You may need to reschedule your appointment if you arrive late (15 or more minutes).  Arriving late affects you and other patients whose appointments are after yours.  Also, if you miss three or more appointments without notifying the office, you may be dismissed from the clinic at the provider's discretion.      For prescription refill requests, have your pharmacy contact our office and allow 72 hours for refills to be completed.    Today you received the following chemotherapy and/or immunotherapy agents: carboplatin .      To help prevent nausea and vomiting after your treatment, we encourage you to take your nausea medication as directed.  BELOW ARE SYMPTOMS THAT SHOULD BE REPORTED IMMEDIATELY: *FEVER GREATER THAN 100.4 F (38 C) OR HIGHER *CHILLS OR SWEATING *NAUSEA AND VOMITING THAT IS NOT CONTROLLED WITH YOUR NAUSEA MEDICATION *UNUSUAL SHORTNESS OF BREATH *UNUSUAL BRUISING OR BLEEDING *URINARY PROBLEMS (pain or burning when urinating, or frequent urination) *BOWEL PROBLEMS (unusual diarrhea, constipation, pain near the anus) TENDERNESS IN MOUTH AND THROAT WITH OR WITHOUT PRESENCE OF ULCERS (sore throat, sores in mouth, or a toothache) UNUSUAL RASH, SWELLING OR PAIN  UNUSUAL VAGINAL DISCHARGE OR ITCHING   Items with * indicate a potential emergency and should be followed up as soon as possible or go to the Emergency Department if any problems should occur.  Please show the CHEMOTHERAPY ALERT CARD or  IMMUNOTHERAPY ALERT CARD at check-in to the Emergency Department and triage nurse.  Should you have questions after your visit or need to cancel or reschedule your appointment, please contact [facility] - A DEPT OF [facility]  Dept: [phone]  and follow the prompts.  Office hours are 8:00 a.m. to 4:30 p.m. Monday - Friday. Please note that voicemails left after 4:00 p.m. may not be returned until the following business day.  We are closed weekends and major holidays. You have access to a nurse at all times for urgent questions. Please call the main number to the clinic Dept: [phone] and follow the prompts.   For any non-urgent questions, you may also contact your provider using MyChart. We now offer e-Visits for anyone 43 and older to request care online for non-urgent symptoms. For details visit [email or web address].   Also download the MyChart app! Go to the app store, search MyChart, open the app, select [facility], and log in with your MyChart username and password.

## 2024-06-22 ENCOUNTER — Ambulatory Visit

## 2024-06-22 ENCOUNTER — Telehealth: Payer: Self-pay

## 2024-06-22 DIAGNOSIS — R2681 Unsteadiness on feet: Secondary | ICD-10-CM | POA: Diagnosis not present

## 2024-06-22 DIAGNOSIS — M6281 Muscle weakness (generalized): Secondary | ICD-10-CM

## 2024-06-22 DIAGNOSIS — R2689 Other abnormalities of gait and mobility: Secondary | ICD-10-CM

## 2024-06-22 NOTE — Therapy (Signed)
 OUTPATIENT PHYSICAL THERAPY NEURO TREATMENT   Patient Name: Susan Frederick MRN: [id number] DOB:Sep 11, 1950, 73 y.o., female Today's Date: 06/22/2024   PCP: Samie Frederick, PA-C REFERRING PROVIDER: Debby Olam POUR, NP   END OF SESSION:  PT End of Session - 06/22/24 1150     Visit Number 3    Number of Visits 17    Date for Recertification  08/04/24    Authorization Type [employer]    Authorization Time Period approved 17 PT visits from 06/07/2024 - 08/04/2024    Authorization - Visit Number 3    Authorization - Number of Visits 17    PT Start Time 1150    PT Stop Time 1230    PT Time Calculation (min) 40 min    Activity Tolerance Patient tolerated treatment well    Behavior During Therapy WFL for tasks assessed/performed           Past Medical History:  Diagnosis Date   Arthritis    Cancer (HCC)    GYY,METS WITH OMENTUM   DDD (degenerative disc disease), lumbar    Diverticulosis of colon    Hematuria    Hypertension    Mixed stress and urge urinary incontinence    Obesity    OSA on CPAP    Pre-diabetes    Past Surgical History:  Procedure Laterality Date   ANKLE FUSION WITH GASTROC SLIDE Left 2010   COLONOSCOPY     CYSTOSCOPY N/A 05/02/2024   Procedure: CYSTOSCOPY;  Surgeon: Eldonna Mays, MD;  Location: [facility];  Service: Gynecology;  Laterality: N/A;   HAND TENDON SURGERY     right   HYSTERECTOMY,ROBOT,W/OMENTECT/SALPINGO-OOPHORECT FOR MALIGNANT NEOPLASM Bilateral 05/02/2024   Procedure: ROBOTIC ASSISTED TOTAL LAPAROSCOPIC HYSTERECTOMY WITH BILATERAL SALPINGO-OOPHORECTOMY, MINILAPAROTOMY FOR OMENTECTOMY, PERITONEAL STRIPPING FOR DEBULKING;  Surgeon: Eldonna Mays, MD;  Location: [facility];  Service: Gynecology;  Laterality: Bilateral;  ROBOTIC ASSISTED WITH DEBULKING   IR IMAGING GUIDED PORT INSERTION  01/18/2024   KNEE ARTHROSCOPY     left   NOSE SURGERY     deviated septum   POLYPECTOMY     ROOT CANAL  2025   x2   TOE SURGERY     right foot/ little  toe   WISDOM TOOTH EXTRACTION  08/11/1967   Patient Active Problem List   Diagnosis Date Noted   Endometrial cancer (HCC) 05/22/2024   Gynecologic malignancy (HCC) 05/02/2024   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   Genetic testing 03/02/2024   Fallopian tube carcinoma (HCC) 01/27/2024   History of colonic polyps 01/28/2011   Diverticulosis of colon 01/28/2011    ONSET DATE: 05/31/2024 (MD referral)  REFERRING DIAG: C48.2 (ICD-10-CM) - Primary peritoneal carcinomatosis (HCC)   THERAPY DIAG:  Unsteadiness on feet  Muscle weakness (generalized)  Other abnormalities of gait and mobility  Rationale for Evaluation and Treatment: Rehabilitation  SUBJECTIVE:  SUBJECTIVE STATEMENT: Did ok after last session. Had chemo infusion yesterday  Pt accompanied by: self  PERTINENT HISTORY: cancer (see PMH) s/p hysterectomy 05/02/2024 ;neuropathy from chemo, OA bilat knees  PAIN:  Are you having pain? Yes: NPRS scale: 5/010 Pain location: B knees  Pain description: sore Aggravating factors: in the AM, weather Relieving factors: stretching   PRECAUTIONS: Fall and Other: no lifting >10#, no lifting/straining pushing and pulling for 6 weeks post surgery (05/02/2024)  RED FLAGS: None   WEIGHT BEARING RESTRICTIONS: No  FALLS: Has patient fallen in last 6 months? No  LIVING ENVIRONMENT: Lives with: lives alone Lives in: House/apartment Stairs: 1 step, then 1 step to enter Has following equipment at home: Single point cane, Walker - 2 wheeled, and walking sticks, were sister's equipment  PLOF: Independentshort walks in the neighborhood, enjoyed water  aerobics  PATIENT GOALS: To address the issues with neuropathy  OBJECTIVE:    TODAY'S TREATMENT: 06/22/24 Activity Comments   NU-step level 5 x 6 min To improve endurance/circulation  Corner balance   Multisensory balance   Dynamic balance             TODAY'S TREATMENT: 06/19/24 Activity Comments  Nustep L4 x 6 min UEs/LEs  Dynamic warm up for knees   standing heel/toe raise  Runner's stretch 30 each  standing march 3# 2x20   sidestepping over hurdles   At TM rail for safety. Cues to avoid hip compensation with DF.  More limited L knee flexion evident d/t stiffness. Weaned to fingertip support with stepping over hurdles.   Pt reported increased knee soreness after standing activities, thus provided short sit break    romberg EO/EC romberg + head turns/nods Romberg on foam standing on foam EC Mild sway with EC. Good stability with head movements.      HOME EXERCISE PROGRAM Last updated: 06/19/24 Access Code: [email or web address] URL: [email or web address] Date: 06/19/2024 Prepared by: [facility] - Outpatient  Rehab - [facility]  Program Notes perform standing activities at counter for safety  Exercises - Heel Toe Raises with Counter Support  - 1 x daily - 5 x weekly - 2 sets - 10 reps - Standing Gastroc Stretch at Counter  - 1 x daily - 5 x weekly - 2 sets - 30 sec hold - Marching with Resistance  - 1 x daily - 5 x weekly - 2 sets - 20 reps - Corner Balance Feet Together With Eyes Closed  - 1 x daily - 7 x weekly - 3 sets - 30 sec hold - Corner Balance Feet Together: Eyes Closed With Head Turns  - 1 x daily - 7 x weekly - 3 sets - 30 sec hold - Semi-Tandem Corner Balance With Eyes Open  - 1 x daily - 7 x weekly - 3 sets - 30 sec hold   PATIENT EDUCATION: Education details: HEP update with edu for safety Person educated: Patient Education method: Explanation, Demonstration, Tactile cues, Verbal cues, and Handouts Education comprehension: verbalized understanding and returned demonstration    Note: Objective measures were completed at Evaluation unless otherwise  noted.  DIAGNOSTIC FINDINGS: NA for this PT episode  COGNITION: Overall cognitive status: Within functional limits for tasks assessed   SENSATION: Light touch: Impaired  and distally Reports that balls of feet forward feel cold  POSTURE: rounded shoulders  LOWER EXTREMITY ROM:   AROM WFL  LOWER EXTREMITY MMT:    MMT Right Eval Left Eval  Hip flexion 4 4  Hip extension  Hip abduction 4 4  Hip adduction 4 4  Hip internal rotation    Hip external rotation    Knee flexion 4 4  Knee extension 4 4  Ankle dorsiflexion 3+ 3+  Ankle plantarflexion    Ankle inversion    Ankle eversion    (Blank rows = not tested)  TRANSFERS: Sit to stand: Modified independence  Assistive device utilized: BUE support     Stand to sit: Modified independence  Assistive device utilized: BUE support      GAIT: Findings: Gait Characteristics: step through pattern and wide BOS, Distance walked: 50 ft, Assistive device utilized:None, and Level of assistance: Modified independence and SBA  FUNCTIONAL TESTS: Timed up and go (TUG): 9.5 sec 10 meter walk test: 16.72 sec (1.96 ft/sec) DGI:  11/24 (<19/24 indicates increased fall risk)    M-CTSIB  Condition 1: Firm Surface, EO  30 Sec, Mild Sway  Condition 2: Firm Surface, EC 30 Sec, Mild and Moderate Sway  Condition 3: Foam Surface, EO 30 Sec, Mild and Moderate Sway  Condition 4: Foam Surface, EC 30 Sec, Moderate and Severe Sway                                                                                                                                   TREATMENT DATE: 06/07/2024    PATIENT EDUCATION: Education details: Eval results, POC Person educated: Patient Education method: Explanation Education comprehension: verbalized understanding  HOME EXERCISE PROGRAM: TBD  GOALS: Goals reviewed with patient? Yes  SHORT TERM GOALS: Target date: 07/07/2024  Pt will be independent with HEP for improved strength, balance,  gait. Baseline: Goal status: INITIAL  2.  Pt will improve gait velocity to at least 2.62 ft/sec for improved gait efficiency and safety. Baseline:  Goal status: INITIAL  3.  MCTSIB Condition 4 to improve to 30 sec mod or less sway for improved balance. Baseline:  Goal status: INITIAL   LONG TERM GOALS: Target date: 08/04/2024  Pt will be independent with HEP for improved balance, gait, strength. Baseline:  Goal status: INITIAL  2.  Pt will improve DGI score to at least 20/24 to decrease fall risk. Baseline: 11/24 Goal status: INITIAL  3.  Pt will verbalize understanding of fall prevention in home environment. Baseline:  Goal status: INITIAL  ASSESSMENT:  CLINICAL IMPRESSION: NU-step for general activity tolerance. HEP review with good recall.  Progressed HEP development for static multisensory balance to improve postural awareness/stability for safety during ADL.  Dynamic balance activities to improve single limb support and spatial awareness for negotiating obstacles.  Demo Fair- dynamic balance requiring UE support.  Continued sessions to progress POC details to improve mobility and reduce risk for falls.    OBJECTIVE IMPAIRMENTS: Abnormal gait, decreased balance, decreased mobility, difficulty walking, decreased strength, and impaired sensation.   ACTIVITY LIMITATIONS: transfers and locomotion level  PARTICIPATION LIMITATIONS: community activity and fitness  PERSONAL FACTORS: 3+ comorbidities: see  above are also affecting patient's functional outcome.   REHAB POTENTIAL: Good  CLINICAL DECISION MAKING: Stable/uncomplicated  EVALUATION COMPLEXITY: Low  PLAN:  PT FREQUENCY: 2x/week  PT DURATION: 8 weeks plus eval  PLANNED INTERVENTIONS: 97750- Physical Performance Testing, 97110-Therapeutic exercises, 97530- Therapeutic activity, [id number]- Neuromuscular re-education, 97535- Self Care, [id number]- Manual therapy, [phone]- Gait training, Patient/Family education, and Balance  training  PLAN FOR NEXT SESSION: review HEP for balance, ankle and hip strategy work, strength, multi-sensory balance  12:34 PM, 06/22/24 M. Kelly Jimmi Sidener, PT, DPT Physical Therapist- [place] Office Number: [phone]

## 2024-06-22 NOTE — Telephone Encounter (Signed)
-----   Message from Susan Frederick sent at 06/22/2024  8:10 AM EST ----- Please let her know the urine culture is positive and we are sending it to her PCP.  Since she just completed an antibiotic she should call her PCP for further instructions.

## 2024-06-22 NOTE — Telephone Encounter (Signed)
 Patient gave verbal understanding and route the result to the patient's pcp.

## 2024-06-23 ENCOUNTER — Inpatient Hospital Stay

## 2024-06-23 ENCOUNTER — Telehealth: Payer: Self-pay | Admitting: *Deleted

## 2024-06-23 LAB — URINE CULTURE: Culture: 30000 — AB

## 2024-06-23 NOTE — Telephone Encounter (Signed)
 PCP office (Dr. Samie) requests 11/11 note and urine culture to be faxed to [phone]. Routed office note, all 11/11 labs and note that we did not start her on antibiotic

## 2024-06-26 ENCOUNTER — Encounter: Payer: Self-pay | Admitting: Physical Therapy

## 2024-06-26 ENCOUNTER — Telehealth: Payer: Self-pay | Admitting: Psychiatry

## 2024-06-26 ENCOUNTER — Ambulatory Visit: Admitting: Physical Therapy

## 2024-06-26 DIAGNOSIS — M6281 Muscle weakness (generalized): Secondary | ICD-10-CM

## 2024-06-26 DIAGNOSIS — R2681 Unsteadiness on feet: Secondary | ICD-10-CM

## 2024-06-26 DIAGNOSIS — R2689 Other abnormalities of gait and mobility: Secondary | ICD-10-CM

## 2024-06-26 NOTE — Telephone Encounter (Signed)
 Call patient regarding her chemotherapy questions. All questions answered.

## 2024-06-26 NOTE — Therapy (Signed)
 OUTPATIENT PHYSICAL THERAPY NEURO TREATMENT   Patient Name: Susan Frederick MRN: [id number] DOB:1951-03-26, 73 y.o., female Today's Date: 06/26/2024   PCP: Samie Frederick, PA-C REFERRING PROVIDER: Debby Olam POUR, NP   END OF SESSION:  PT End of Session - 06/26/24 1231     Visit Number 4    Number of Visits 17    Date for Recertification  08/04/24    Authorization Type [employer]    Authorization Time Period approved 17 PT visits from 06/07/2024 - 08/04/2024    Authorization - Visit Number 4    Authorization - Number of Visits 17    PT Start Time 1234    PT Stop Time 1316    PT Time Calculation (min) 42 min    Activity Tolerance Patient tolerated treatment well    Behavior During Therapy WFL for tasks assessed/performed            Past Medical History:  Diagnosis Date   Arthritis    Cancer (HCC)    GYY,METS WITH OMENTUM   DDD (degenerative disc disease), lumbar    Diverticulosis of colon    Hematuria    Hypertension    Mixed stress and urge urinary incontinence    Obesity    OSA on CPAP    Pre-diabetes    Past Surgical History:  Procedure Laterality Date   ANKLE FUSION WITH GASTROC SLIDE Left 2010   COLONOSCOPY     CYSTOSCOPY N/A 05/02/2024   Procedure: CYSTOSCOPY;  Surgeon: Eldonna Mays, MD;  Location: [facility];  Service: Gynecology;  Laterality: N/A;   HAND TENDON SURGERY     right   HYSTERECTOMY,ROBOT,W/OMENTECT/SALPINGO-OOPHORECT FOR MALIGNANT NEOPLASM Bilateral 05/02/2024   Procedure: ROBOTIC ASSISTED TOTAL LAPAROSCOPIC HYSTERECTOMY WITH BILATERAL SALPINGO-OOPHORECTOMY, MINILAPAROTOMY FOR OMENTECTOMY, PERITONEAL STRIPPING FOR DEBULKING;  Surgeon: Eldonna Mays, MD;  Location: [facility];  Service: Gynecology;  Laterality: Bilateral;  ROBOTIC ASSISTED WITH DEBULKING   IR IMAGING GUIDED PORT INSERTION  01/18/2024   KNEE ARTHROSCOPY     left   NOSE SURGERY     deviated septum   POLYPECTOMY     ROOT CANAL  2025   x2   TOE SURGERY     right foot/  little toe   WISDOM TOOTH EXTRACTION  08/11/1967   Patient Active Problem List   Diagnosis Date Noted   Endometrial cancer (HCC) 05/22/2024   Gynecologic malignancy (HCC) 05/02/2024   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  Feel so-so.    Pt accompanied by: self  PERTINENT HISTORY: cancer (see PMH) s/p hysterectomy 05/02/2024 ;neuropathy from chemo, OA bilat knees  PAIN:  Are you having pain? Yes: NPRS scale: 2-3/10, up to 5/10 Pain location: B knees  Pain description: sore Aggravating factors: in the AM, weather Relieving factors: stretching   PRECAUTIONS: Fall and Other: no lifting >10#, no lifting/straining pushing and pulling for 6 weeks post surgery (05/02/2024)  RED FLAGS: None   WEIGHT BEARING RESTRICTIONS: No  FALLS: Has patient fallen in last 6 months? No  LIVING ENVIRONMENT: Lives with: lives alone Lives in: House/apartment Stairs: 1 step, then 1 step to enter Has following equipment at home: Single point cane, Walker - 2 wheeled, and walking sticks, were sister's equipment  PLOF: Independentshort walks in the neighborhood, enjoyed water  aerobics  PATIENT GOALS: To address the issues with neuropathy  OBJECTIVE:    TODAY'S  TREATMENT: 06/26/2024 Activity Comments  NuStep, Level 4, 4 extremities x 6 minutes Dynamic warm up HR 82, O2 98%  Review HEP for corner balance ex -progressed to EC partial tandem in corner, 1 rep x 30 sec    Increased sway EC  Forward/back walking Sidestepping + resistance at thighs Monster walks fwd/back 2 reps  Red band  LAQ 10 reps, 3 sec hold Hamstrings x 10, red band   Step taps to 6 step, then hover above step for SLS Intermittent UE support  Step ups to 4 step Intermittent UE support   Access Code: [email or web address] URL: [email or web address] Date: 06/26/2024 Prepared by: [facility] - Outpatient  Rehab - [facility]  Program Notes perform standing activities at counter for safety  Exercises - Heel Toe Raises with Counter Support  - 1 x daily - 5 x weekly - 2 sets - 10 reps - Standing Gastroc Stretch at Counter  - 1 x daily - 5 x weekly - 2 sets - 30 sec hold - Marching with Resistance  - 1 x daily - 5 x weekly - 2 sets - 20 reps - Corner Balance Feet Together With Eyes Closed  - 1 x daily - 7 x weekly - 3 sets - 30 sec hold - Corner Balance Feet Together: Eyes Closed With Head Turns  - 1 x daily - 7 x weekly - 3 sets - 30 sec hold - Semi-Tandem Corner Balance With Eyes Open  - 1 x daily - 7 x weekly - 3 sets - 30 sec hold - Forward Step Touch  - 1 x daily - 7 x weekly - 3 sets - 10 reps    HOME EXERCISE PROGRAM Last updated: 06/19/24 Access Code: [email or web address] URL: [email or web address] Date: 06/19/2024 Prepared by: [facility] - Outpatient  Rehab - [facility]  Program Notes perform standing activities at counter for safety  Exercises - Heel Toe Raises with Counter Support  - 1 x daily - 5 x weekly - 2 sets - 10 reps - Standing Gastroc Stretch at Counter  - 1 x daily - 5 x weekly - 2 sets - 30 sec hold - Marching with Resistance  - 1 x daily - 5 x weekly - 2 sets - 20 reps - Corner Balance Feet Together With Eyes Closed  - 1 x daily  - 7 x weekly - 3 sets - 30 sec hold - Corner Balance Feet Together: Eyes Closed With Head Turns  - 1 x daily - 7  x weekly - 3 sets - 30 sec hold - Semi-Tandem Corner Balance With Eyes Open  - 1 x daily - 7 x weekly - 3 sets - 30 sec hold   PATIENT EDUCATION: Education details: HEP update with edu for safety Person educated: Patient Education method: Explanation, Demonstration, Tactile cues, Verbal cues, and Handouts Education comprehension: verbalized understanding and returned demonstration    Note: Objective measures were completed at Evaluation unless otherwise noted.  DIAGNOSTIC FINDINGS: NA for this PT episode  COGNITION: Overall cognitive status: Within functional limits for tasks assessed   SENSATION: Light touch: Impaired  and distally Reports that balls of feet forward feel cold  POSTURE: rounded shoulders  LOWER EXTREMITY ROM:   AROM WFL  LOWER EXTREMITY MMT:    MMT Right Eval Left Eval  Hip flexion 4 4  Hip extension    Hip abduction 4 4  Hip adduction 4 4  Hip internal rotation    Hip external rotation    Knee flexion 4 4  Knee extension 4 4  Ankle dorsiflexion 3+ 3+  Ankle plantarflexion    Ankle inversion    Ankle eversion    (Blank rows = not tested)  TRANSFERS: Sit to stand: Modified independence  Assistive device utilized: BUE support     Stand to sit: Modified independence  Assistive device utilized: BUE support      GAIT: Findings: Gait Characteristics: step through pattern and wide BOS, Distance walked: 50 ft, Assistive device utilized:None, and Level of assistance: Modified independence and SBA  FUNCTIONAL TESTS: Timed up and go (TUG): 9.5 sec 10 meter walk test: 16.72 sec (1.96 ft/sec) DGI:  11/24 (<19/24 indicates increased fall risk)    M-CTSIB  Condition 1: Firm Surface, EO  30 Sec, Mild Sway  Condition 2: Firm Surface, EC 30 Sec, Mild and Moderate Sway  Condition 3: Foam Surface, EO 30 Sec, Mild and Moderate Sway  Condition 4:  Foam Surface, EC 30 Sec, Moderate and Severe Sway                                                                                                                                   TREATMENT DATE: 06/07/2024    PATIENT EDUCATION: Education details: Eval results, POC Person educated: Patient Education method: Explanation Education comprehension: verbalized understanding  HOME EXERCISE PROGRAM: TBD  GOALS: Goals reviewed with patient? Yes  SHORT TERM GOALS: Target date: 07/07/2024  Pt will be independent with HEP for improved strength, balance, gait. Baseline: Goal status: IN PROGRESS  2.  Pt will improve gait velocity to at least 2.62 ft/sec for improved gait efficiency and safety. Baseline:  Goal status: IN PROGRESS  3.  MCTSIB Condition 4 to improve to 30 sec mod or less sway for improved balance. Baseline:  Goal status: IN PROGRESS   LONG TERM GOALS: Target date: 08/04/2024  Pt will be independent with HEP for improved balance, gait, strength. Baseline:  Goal status: IN PROGRESS  2.  Pt will improve DGI score to at least 20/24 to decrease fall risk. Baseline: 11/24 Goal status: IN PROGRESS  3.  Pt will verbalize understanding of fall prevention in home environment. Baseline:  Goal status: IN PROGRESS  ASSESSMENT:  CLINICAL IMPRESSION: Pt presents today with no new complaints. Skilled PT session focused on balance and strength exercises. Pt needs intermittent UE support for balance exercises and has more difficulty with SLS activities static and dynamic.  Reviewed HEP and was able to progress to [facility] partial tandem stance and SLS step taps.  Pt will continue to benefit from skilled PT towards goals for improved functional mobility and decreased fall risk.  OBJECTIVE IMPAIRMENTS: Abnormal gait, decreased balance, decreased mobility, difficulty walking, decreased strength, and impaired sensation.   ACTIVITY LIMITATIONS: transfers and locomotion  level  PARTICIPATION LIMITATIONS: community activity and fitness  PERSONAL FACTORS: 3+ comorbidities: see above are also affecting patient's functional outcome.   REHAB POTENTIAL: Good  CLINICAL DECISION MAKING: Stable/uncomplicated  EVALUATION COMPLEXITY: Low  PLAN:  PT FREQUENCY: 2x/week  PT DURATION: 8 weeks plus eval  PLANNED INTERVENTIONS: 97750- Physical Performance Testing, 97110-Therapeutic exercises, 97530- Therapeutic activity, 97112- Neuromuscular re-education, 97535- Self Care, 02859- Manual therapy, [phone]- Gait training, Patient/Family education, and Balance training  PLAN FOR NEXT SESSION: review updates to HEP for balance, ankle and hip strategy work, strength, multi-sensory balance  Greig Anon, PT 06/26/24 1:21 PM Phone: [phone] Fax: [phone]  [facility] at [facility] [address], [address] [place], [place] [address] Phone # [phone] Fax # [phone]

## 2024-06-28 ENCOUNTER — Encounter: Payer: Self-pay | Admitting: Oncology

## 2024-06-28 ENCOUNTER — Other Ambulatory Visit (HOSPITAL_BASED_OUTPATIENT_CLINIC_OR_DEPARTMENT_OTHER): Payer: Self-pay

## 2024-06-28 MED ORDER — NITROFURANTOIN MONOHYD MACRO 100 MG PO CAPS
100.0000 mg | ORAL_CAPSULE | Freq: Every evening | ORAL | 6 refills | Status: AC
  Filled 2024-06-28: qty 30, 30d supply, fill #0
  Filled 2024-07-24: qty 30, 30d supply, fill #1
  Filled 2024-09-01: qty 30, 30d supply, fill #2

## 2024-06-29 ENCOUNTER — Ambulatory Visit

## 2024-06-29 DIAGNOSIS — R2681 Unsteadiness on feet: Secondary | ICD-10-CM

## 2024-06-29 DIAGNOSIS — R2689 Other abnormalities of gait and mobility: Secondary | ICD-10-CM

## 2024-06-29 DIAGNOSIS — M6281 Muscle weakness (generalized): Secondary | ICD-10-CM

## 2024-06-29 NOTE — Therapy (Signed)
 OUTPATIENT PHYSICAL THERAPY NEURO TREATMENT   Patient Name: Susan Frederick MRN: [id number] DOB:03-Dec-1950, 73 y.o., female Today's Date: 06/29/2024   PCP: Samie Frederick, PA-C REFERRING PROVIDER: Debby Olam POUR, NP   END OF SESSION:  PT End of Session - 06/29/24 1312     Visit Number 5    Number of Visits 17    Date for Recertification  08/04/24    Authorization Type [employer]    Authorization Time Period approved 17 PT visits from 06/07/2024 - 08/04/2024    Authorization - Visit Number 5    Authorization - Number of Visits 17    PT Start Time 1315    PT Stop Time 1400    PT Time Calculation (min) 45 min    Activity Tolerance Patient tolerated treatment well    Behavior During Therapy WFL for tasks assessed/performed            Past Medical History:  Diagnosis Date   Arthritis    Cancer (HCC)    GYY,METS WITH OMENTUM   DDD (degenerative disc disease), lumbar    Diverticulosis of colon    Hematuria    Hypertension    Mixed stress and urge urinary incontinence    Obesity    OSA on CPAP    Pre-diabetes    Past Surgical History:  Procedure Laterality Date   ANKLE FUSION WITH GASTROC SLIDE Left 2010   COLONOSCOPY     CYSTOSCOPY N/A 05/02/2024   Procedure: CYSTOSCOPY;  Surgeon: Eldonna Mays, MD;  Location: [facility];  Service: Gynecology;  Laterality: N/A;   HAND TENDON SURGERY     right   HYSTERECTOMY,ROBOT,W/OMENTECT/SALPINGO-OOPHORECT FOR MALIGNANT NEOPLASM Bilateral 05/02/2024   Procedure: ROBOTIC ASSISTED TOTAL LAPAROSCOPIC HYSTERECTOMY WITH BILATERAL SALPINGO-OOPHORECTOMY, MINILAPAROTOMY FOR OMENTECTOMY, PERITONEAL STRIPPING FOR DEBULKING;  Surgeon: Eldonna Mays, MD;  Location: [facility];  Service: Gynecology;  Laterality: Bilateral;  ROBOTIC ASSISTED WITH DEBULKING   IR IMAGING GUIDED PORT INSERTION  01/18/2024   KNEE ARTHROSCOPY     left   NOSE SURGERY     deviated septum   POLYPECTOMY     ROOT CANAL  2025   x2   TOE SURGERY     right foot/  little toe   WISDOM TOOTH EXTRACTION  08/11/1967   Patient Active Problem List   Diagnosis Date Noted   Endometrial cancer (HCC) 05/22/2024   Gynecologic malignancy (HCC) 05/02/2024   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 The marching with the band causes some lasting joint discomfort Pt accompanied by: self  PERTINENT HISTORY: cancer (see PMH) s/p hysterectomy 05/02/2024 ;neuropathy from chemo, OA bilat knees  PAIN:  Are you having pain? Yes: NPRS scale: 2-3/10, up to 5/10 Pain location: B knees  Pain description: sore Aggravating factors: in the AM, weather Relieving factors: stretching   PRECAUTIONS: Fall and Other: no lifting >10#, no lifting/straining pushing and pulling for 6 weeks post surgery (05/02/2024)  RED FLAGS: None   WEIGHT BEARING RESTRICTIONS: No  FALLS: Has patient fallen in last 6 months? No  LIVING ENVIRONMENT: Lives with: lives alone Lives in: House/apartment Stairs: 1 step, then 1 step to enter Has following equipment at home: Single point cane, Walker - 2 wheeled, and walking sticks, were sister's equipment  PLOF: Independentshort walks in the neighborhood, enjoyed water  aerobics  PATIENT GOALS: To address the issues with neuropathy  OBJECTIVE:   TODAY'S TREATMENT:  06/29/24 Activity Comments  Sidestepping x 1.5 min [place] knees  Forwards/backwards monster x 1.5 min [place] knees  Heel-toe lift 2x10   LE lift-offs 1x10 4   Stepping over hurdles: forwards/lateral BUE support to single UE  Static multisensory balance   NU-step level 3 x 8 min       TODAY'S TREATMENT: 06/26/2024 Activity Comments  NuStep, Level 4, 4 extremities x 6 minutes Dynamic warm up HR 82, O2 98%  Review HEP for corner balance ex -progressed to EC partial tandem in corner, 1 rep x 30 sec    Increased sway EC  Forward/back walking Sidestepping + resistance at thighs Monster walks fwd/back 2 reps  Red band  LAQ 10 reps, 3 sec hold Hamstrings x 10, red band   Step taps to 6 step, then hover above step for SLS Intermittent UE support  Step ups to 4 step Intermittent UE support       HOME EXERCISE PROGRAM Last updated: 06/19/24 Access Code: [email or web address] URL: [email or web address] Date: 06/19/2024 Prepared by: [facility] - Outpatient  Rehab - [facility]  Program Notes perform standing activities at counter for safety  Exercises - Heel Toe Raises with Counter Support  - 1 x daily - 5 x weekly - 2 sets - 10 reps - Standing Gastroc Stretch at Counter  - 1 x daily - 5 x weekly - 2 sets - 30 sec hold - Corner Balance Feet Together With Eyes Closed  - 1 x daily - 7 x weekly - 3 sets - 30 sec hold - Corner Balance Feet Together: Eyes Closed With Head Turns  - 1 x daily - 7 x weekly - 3 sets - 30 sec hold - Semi-Tandem Corner Balance With Eyes Open  - 1 x daily - 7 x weekly - 3 sets - 30 sec hold - Side Stepping with Resistance at Thighs and Counter Support  - 7 x weekly - 1-3 sets - 1-2 min round hold - Standing Foot Lift on Box (BKA)  - 1 x daily - 7 x weekly - 1-3 sets - 10 reps   PATIENT EDUCATION: Education details: HEP update with edu for safety Person educated: Patient Education method: Explanation, Demonstration, Tactile cues, Verbal  cues, and Handouts Education comprehension: verbalized understanding and returned demonstration    Note: Objective measures were completed at Evaluation unless otherwise noted.  DIAGNOSTIC FINDINGS: NA for this PT episode  COGNITION: Overall cognitive status: Within functional limits for tasks assessed   SENSATION: Light touch: Impaired  and distally Reports that balls of feet forward feel cold  POSTURE: rounded shoulders  LOWER EXTREMITY ROM:   AROM WFL  LOWER EXTREMITY MMT:    MMT Right Eval Left Eval  Hip flexion 4 4  Hip extension    Hip abduction 4 4  Hip adduction 4 4  Hip internal rotation    Hip external rotation    Knee flexion 4 4  Knee extension 4 4  Ankle dorsiflexion 3+ 3+  Ankle plantarflexion    Ankle inversion    Ankle eversion    (Blank rows = not tested)  TRANSFERS: Sit to stand: Modified independence  Assistive device utilized: BUE support     Stand to sit: Modified independence  Assistive device utilized: BUE support      GAIT: Findings: Gait Characteristics: step through pattern and wide BOS, Distance walked: 50 ft, Assistive device utilized:None, and Level of assistance: Modified independence and SBA  FUNCTIONAL TESTS: Timed up and go (TUG): 9.5 sec 10 meter walk test: 16.72 sec (1.96 ft/sec) DGI:  11/24 (<19/24 indicates increased fall risk)    M-CTSIB  Condition 1: Firm Surface, EO  30 Sec, Mild Sway  Condition 2: Firm Surface, EC 30 Sec, Mild and Moderate Sway  Condition 3: Foam Surface, EO 30 Sec, Mild and Moderate Sway  Condition 4: Foam Surface, EC 30 Sec, Moderate and Severe Sway                                                                                                                                   TREATMENT DATE: 06/07/2024    PATIENT EDUCATION: Education details: Eval results, POC Person educated: Patient Education method: Explanation Education comprehension: verbalized understanding  HOME EXERCISE  PROGRAM: TBD  GOALS: Goals reviewed with patient? Yes  SHORT TERM GOALS: Target date: 07/07/2024  Pt will be independent with HEP for improved strength, balance, gait. Baseline: Goal status: IN PROGRESS  2.  Pt will improve gait velocity to at least 2.62 ft/sec for improved gait efficiency and safety. Baseline:  Goal status: IN PROGRESS  3.  MCTSIB Condition 4 to improve to 30 sec mod or less sway for improved balance. Baseline:  Goal status: IN PROGRESS   LONG TERM GOALS: Target date: 08/04/2024  Pt will be independent with HEP for improved balance, gait, strength. Baseline:  Goal status: IN PROGRESS  2.  Pt will improve DGI score to at least 20/24 to decrease fall risk. Baseline: 11/24 Goal status: IN PROGRESS  3.  Pt will verbalize understanding of fall prevention in home environment. Baseline:  Goal status: IN PROGRESS  ASSESSMENT:  CLINICAL IMPRESSION: Reports some lingering left knee pain from performing resisted marching. Modified HEP to progress hip strength and single limb stance/balance.  Reinforced with dynamic and static balance activities to facilitate single limb support and height for negotiating obstacles.  Limited in some activity by left knee joint discomfort instability with activities accommodated  to meet this need. Improved balance under multisensory conditions with mild-moderate sway standing on foam eyes closed w/ head movements.   NU-step for cardiovascular exercise to improve actiivty tolerance and ankle strength exercises for improve stability/power OBJECTIVE IMPAIRMENTS: Abnormal gait, decreased balance, decreased mobility, difficulty walking, decreased strength, and impaired sensation.   ACTIVITY LIMITATIONS: transfers and locomotion level  PARTICIPATION LIMITATIONS: community activity and fitness  PERSONAL FACTORS: 3+ comorbidities: see above are also affecting patient's functional outcome.   REHAB POTENTIAL: Good  CLINICAL DECISION  MAKING: Stable/uncomplicated  EVALUATION COMPLEXITY: Low  PLAN:  PT FREQUENCY: 2x/week  PT DURATION: 8 weeks plus eval  PLANNED INTERVENTIONS: 97750- Physical Performance Testing, 97110-Therapeutic exercises, 97530- Therapeutic activity, [id number]- Neuromuscular re-education, 97535- Self Care, 02859- Manual therapy, [id number]- Gait training, Patient/Family education, and Balance training  PLAN FOR NEXT SESSION: review updates to HEP for balance, ankle and hip strategy work, strength, multi-sensory balance  2:03 PM, 06/29/24 M. Kelly Rien Marland, PT, DPT Physical Therapist- [place] Office Number: [phone]

## 2024-07-04 ENCOUNTER — Ambulatory Visit

## 2024-07-04 DIAGNOSIS — M6281 Muscle weakness (generalized): Secondary | ICD-10-CM

## 2024-07-04 DIAGNOSIS — R2689 Other abnormalities of gait and mobility: Secondary | ICD-10-CM

## 2024-07-04 DIAGNOSIS — R2681 Unsteadiness on feet: Secondary | ICD-10-CM | POA: Diagnosis not present

## 2024-07-04 NOTE — Therapy (Signed)
 OUTPATIENT PHYSICAL THERAPY NEURO TREATMENT   Patient Name: Susan Frederick MRN: [id number] DOB:1950/09/18, 73 y.o., female Today's Date: 07/04/2024   PCP: Samie Frederick, PA-C REFERRING PROVIDER: Debby Olam POUR, NP   END OF SESSION:  PT End of Session - 07/04/24 1532     Visit Number 6    Number of Visits 17    Date for Recertification  08/04/24    Authorization Type [employer]    Authorization Time Period approved 17 PT visits from 06/07/2024 - 08/04/2024    Authorization - Visit Number 6    Authorization - Number of Visits 17    PT Start Time 1530    PT Stop Time 1615    PT Time Calculation (min) 45 min    Activity Tolerance Patient tolerated treatment well    Behavior During Therapy WFL for tasks assessed/performed            Past Medical History:  Diagnosis Date   Arthritis    Cancer (HCC)    GYY,METS WITH OMENTUM   DDD (degenerative disc disease), lumbar    Diverticulosis of colon    Hematuria    Hypertension    Mixed stress and urge urinary incontinence    Obesity    OSA on CPAP    Pre-diabetes    Past Surgical History:  Procedure Laterality Date   ANKLE FUSION WITH GASTROC SLIDE Left 2010   COLONOSCOPY     CYSTOSCOPY N/A 05/02/2024   Procedure: CYSTOSCOPY;  Surgeon: Eldonna Mays, MD;  Location: [facility];  Service: Gynecology;  Laterality: N/A;   HAND TENDON SURGERY     right   HYSTERECTOMY,ROBOT,W/OMENTECT/SALPINGO-OOPHORECT FOR MALIGNANT NEOPLASM Bilateral 05/02/2024   Procedure: ROBOTIC ASSISTED TOTAL LAPAROSCOPIC HYSTERECTOMY WITH BILATERAL SALPINGO-OOPHORECTOMY, MINILAPAROTOMY FOR OMENTECTOMY, PERITONEAL STRIPPING FOR DEBULKING;  Surgeon: Eldonna Mays, MD;  Location: [facility];  Service: Gynecology;  Laterality: Bilateral;  ROBOTIC ASSISTED WITH DEBULKING   IR IMAGING GUIDED PORT INSERTION  01/18/2024   KNEE ARTHROSCOPY     left   NOSE SURGERY     deviated septum   POLYPECTOMY     ROOT CANAL  2025   x2   TOE SURGERY     right foot/  little toe   WISDOM TOOTH EXTRACTION  08/11/1967   Patient Active Problem List   Diagnosis Date Noted   Endometrial cancer (HCC) 05/22/2024   Gynecologic malignancy (HCC) 05/02/2024   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 1/10 knee pain with sitting 5-8/10 w/ sit to stand and walking Pt accompanied by: self  PERTINENT HISTORY: cancer (see PMH) s/p hysterectomy 05/02/2024 ;neuropathy from chemo, OA bilat knees  PAIN:  Are you having pain? Yes: NPRS scale: 2-3/10, up to 5/10 Pain location: B knees  Pain description: sore Aggravating factors: in the AM, weather Relieving factors: stretching   PRECAUTIONS: Fall and Other: no lifting >10#, no lifting/straining pushing and pulling for 6 weeks post surgery (05/02/2024)  RED FLAGS: None   WEIGHT BEARING RESTRICTIONS: No  FALLS: Has patient fallen in last 6 months? No  LIVING ENVIRONMENT: Lives with: lives alone Lives in: House/apartment Stairs: 1 step, then 1 step to enter Has following equipment at home: Single point cane, Walker - 2 wheeled, and walking sticks, were sister's equipment  PLOF: Independentshort walks in the neighborhood, enjoyed water  aerobics  PATIENT GOALS: To address the issues with  neuropathy  OBJECTIVE:   TODAY'S TREATMENT: 07/04/24 Activity Comments  Gait training Trials w/ cane for offloading LLE  Seated LE PRE -LAQ 3#, 2x10 -hip add iso 2x10 -seated hamstring curls 2x10 green loop -standing hip abd 2x10, 3# -donkey kick 2x10, 3#  HEP review, static multisensory balance activities   Gait speed: 2.2 ft/sec Notes RLE antalgia today  M-CTSIB Condition 4: mild sway x 30 sec        TODAY'S TREATMENT: 06/29/24 Activity Comments  Sidestepping x 1.5 min Green loop knees  Forwards/backwards monster x 1.5 min Green loop knees  Heel-toe lift 2x10   LE lift-offs 1x10 4   Stepping over hurdles: forwards/lateral BUE support to single UE  Static multisensory balance   NU-step level 3 x 8 min       TODAY'S TREATMENT: 06/26/2024 Activity Comments  NuStep, Level 4, 4 extremities x 6 minutes Dynamic warm up HR 82, O2 98%  Review HEP for corner balance ex -progressed to EC partial tandem in corner, 1 rep x 30 sec    Increased sway EC  Forward/back walking Sidestepping + resistance at thighs Monster walks fwd/back 2 reps  Red band  LAQ 10 reps, 3 sec hold Hamstrings x 10, red band   Step taps to 6 step, then hover above step for SLS Intermittent UE support  Step ups to 4 step Intermittent UE support       HOME EXERCISE PROGRAM Last updated: 06/19/24 Access Code: [email or web address] URL: [email or web address] Date: 06/19/2024 Prepared by: [facility] - Outpatient  Rehab - [facility]  Program Notes perform standing activities at counter for safety  Exercises - Heel Toe Raises with Counter Support  - 1 x daily - 5 x weekly - 2 sets - 10 reps - Standing Gastroc Stretch at Counter  - 1 x daily - 5 x weekly - 2 sets - 30 sec hold - Corner Balance Feet Together With Eyes Closed  - 1 x daily - 7 x weekly - 3 sets - 30 sec hold - Corner Balance Feet Together: Eyes Closed With Head Turns  - 1 x daily - 7 x weekly - 3 sets - 30 sec hold -  Semi-Tandem Corner Balance With Eyes Open  - 1 x daily - 7 x weekly - 3 sets - 30 sec hold - Side Stepping with Resistance at Thighs and Counter Support  - 7 x weekly - 1-3 sets - 1-2 min round hold - Standing Foot Lift on Box (BKA)  - 1 x daily -  7 x weekly - 1-3 sets - 10 reps   PATIENT EDUCATION: Education details: HEP update with edu for safety Person educated: Patient Education method: Explanation, Demonstration, Tactile cues, Verbal cues, and Handouts Education comprehension: verbalized understanding and returned demonstration    Note: Objective measures were completed at Evaluation unless otherwise noted.  DIAGNOSTIC FINDINGS: NA for this PT episode  COGNITION: Overall cognitive status: Within functional limits for tasks assessed   SENSATION: Light touch: Impaired  and distally Reports that balls of feet forward feel cold  POSTURE: rounded shoulders  LOWER EXTREMITY ROM:   AROM WFL  LOWER EXTREMITY MMT:    MMT Right Eval Left Eval  Hip flexion 4 4  Hip extension    Hip abduction 4 4  Hip adduction 4 4  Hip internal rotation    Hip external rotation    Knee flexion 4 4  Knee extension 4 4  Ankle dorsiflexion 3+ 3+  Ankle plantarflexion    Ankle inversion    Ankle eversion    (Blank rows = not tested)  TRANSFERS: Sit to stand: Modified independence  Assistive device utilized: BUE support     Stand to sit: Modified independence  Assistive device utilized: BUE support      GAIT: Findings: Gait Characteristics: step through pattern and wide BOS, Distance walked: 50 ft, Assistive device utilized:None, and Level of assistance: Modified independence and SBA  FUNCTIONAL TESTS: Timed up and go (TUG): 9.5 sec 10 meter walk test: 16.72 sec (1.96 ft/sec) DGI:  11/24 (<19/24 indicates increased fall risk)    M-CTSIB  Condition 1: Firm Surface, EO  30 Sec, Mild Sway  Condition 2: Firm Surface, EC 30 Sec, Mild and Moderate Sway  Condition 3: Foam Surface, EO 30  Sec, Mild and Moderate Sway  Condition 4: Foam Surface, EC 30 Sec, Moderate and Severe Sway                                                                                                                                   TREATMENT DATE: 06/07/2024    PATIENT EDUCATION: Education details: Eval results, POC Person educated: Patient Education method: Explanation Education comprehension: verbalized understanding  HOME EXERCISE PROGRAM: TBD  GOALS: Goals reviewed with patient? Yes  SHORT TERM GOALS: Target date: 07/07/2024  Pt will be independent with HEP for improved strength, balance, gait. Baseline: Goal status: MET  2.  Pt will improve gait velocity to at least 2.62 ft/sec for improved gait efficiency and safety. Baseline: 2.2 ft/sec Goal status: NOT MET  3.  MCTSIB Condition 4 to improve to 30 sec mod or less sway for improved balance. Baseline: mild x 30 sec Goal status: MET   LONG TERM GOALS: Target date: 08/04/2024  Pt will be independent with HEP for improved balance, gait, strength. Baseline:  Goal status: IN PROGRESS  2.  Pt will improve DGI score to at least 20/24 to decrease fall risk.  Baseline: 11/24 Goal status: IN PROGRESS  3.  Pt will verbalize understanding of fall prevention in home environment. Baseline:  Goal status: IN PROGRESS  ASSESSMENT:  CLINICAL IMPRESSION: Pt reports ongoing bilateral knee discomfort made worse with standing/activity L > R and ambulates with antalgia and report of left lateral knee/anterior leg discomfort. Gait training w/ cane for offloading and cues in sequence with decreased antalgic pattern but reports minimal perceived change to knee discomfort. Instructed in seated and standing OKC to improve LE strength and activity tolerance as means to exercise without knee pain and review to HEP with good recall and improved balance evident per performance condition 4 M-CTSIB with mild sway x 30 sec and instructed in additional  multisensory balance activities to improve postural awareness/limits of stability.   Overall, mostly limited during today's session due to knee pain  OBJECTIVE IMPAIRMENTS: Abnormal gait, decreased balance, decreased mobility, difficulty walking, decreased strength, and impaired sensation.   ACTIVITY LIMITATIONS: transfers and locomotion level  PARTICIPATION LIMITATIONS: community activity and fitness  PERSONAL FACTORS: 3+ comorbidities: see above are also affecting patient's functional outcome.   REHAB POTENTIAL: Good  CLINICAL DECISION MAKING: Stable/uncomplicated  EVALUATION COMPLEXITY: Low  PLAN:  PT FREQUENCY: 2x/week  PT DURATION: 8 weeks plus eval  PLANNED INTERVENTIONS: 97750- Physical Performance Testing, 97110-Therapeutic exercises, 97530- Therapeutic activity, [id number]- Neuromuscular re-education, 97535- Self Care, 02859- Manual therapy, [phone]- Gait training, Patient/Family education, and Balance training  PLAN FOR NEXT SESSION: review updates to HEP for balance, ankle and hip strategy work, strength, multi-sensory balance  3:32 PM, 07/04/24 M. Kelly Duan Scharnhorst, PT, DPT Physical Therapist- [place] Office Number: [phone]

## 2024-07-05 NOTE — Therapy (Signed)
 OUTPATIENT PHYSICAL THERAPY NEURO TREATMENT   Patient Name: Susan Frederick MRN: [id number] DOB:[birth year], 73 y.o., female Today's Date: 07/10/2024   PCP: Samie Frederick, PA-C REFERRING PROVIDER: Debby Olam POUR, NP   END OF SESSION:  PT End of Session - 07/10/24 1357     Visit Number 7    Number of Visits 17    Date for Recertification  08/04/24    Authorization Type [employer]    Authorization Time Period approved 17 PT visits from 06/07/2024 - 08/04/2024    Authorization - Visit Number 7    Authorization - Number of Visits 17    PT Start Time 1317    PT Stop Time 1358    PT Time Calculation (min) 41 min    Activity Tolerance Patient tolerated treatment well    Behavior During Therapy WFL for tasks assessed/performed             Past Medical History:  Diagnosis Date   Arthritis    Cancer (HCC)    GYY,METS WITH OMENTUM   DDD (degenerative disc disease), lumbar    Diverticulosis of colon    Hematuria    Hypertension    Mixed stress and urge urinary incontinence    Obesity    OSA on CPAP    Pre-diabetes    Past Surgical History:  Procedure Laterality Date   ANKLE FUSION WITH GASTROC SLIDE Left 2010   COLONOSCOPY     CYSTOSCOPY N/A 05/02/2024   Procedure: CYSTOSCOPY;  Surgeon: Eldonna Mays, MD;  Location: [facility];  Service: Gynecology;  Laterality: N/A;   HAND TENDON SURGERY     right   HYSTERECTOMY,ROBOT,W/OMENTECT/SALPINGO-OOPHORECT FOR MALIGNANT NEOPLASM Bilateral 05/02/2024   Procedure: ROBOTIC ASSISTED TOTAL LAPAROSCOPIC HYSTERECTOMY WITH BILATERAL SALPINGO-OOPHORECTOMY, MINILAPAROTOMY FOR OMENTECTOMY, PERITONEAL STRIPPING FOR DEBULKING;  Surgeon: Eldonna Mays, MD;  Location: [facility];  Service: Gynecology;  Laterality: Bilateral;  ROBOTIC ASSISTED WITH DEBULKING   IR IMAGING GUIDED PORT INSERTION  01/18/2024   KNEE ARTHROSCOPY     left   NOSE SURGERY     deviated septum   POLYPECTOMY     ROOT CANAL  2025   x2   TOE SURGERY     right foot/  little toe   WISDOM TOOTH EXTRACTION  08/11/1967   Patient Active Problem List   Diagnosis Date Noted   Endometrial cancer (HCC) 05/22/2024   Gynecologic malignancy (HCC) 05/02/2024   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 Not sure if it is the exercises or the weather.     Pt accompanied by: self  PERTINENT HISTORY: cancer (see PMH) s/p hysterectomy 05/02/2024 ;neuropathy from chemo, OA bilat knees  PAIN:  Are you having pain? Yes: NPRS scale: 3/10 at rest, 6-7/10 when walking  Pain location: B knees  Pain description: sore Aggravating factors: in the AM, weather Relieving factors: stretching   PRECAUTIONS: Fall and Other: no lifting >10#, no lifting/straining pushing and pulling for 6 weeks post surgery (05/02/2024)  RED FLAGS: None   WEIGHT BEARING RESTRICTIONS: No  FALLS: Has patient fallen in last 6 months? No  LIVING ENVIRONMENT: Lives with: lives alone Lives in: House/apartment Stairs: 1 step, then 1 step to enter Has following equipment at home: Single point cane, Walker - 2 wheeled, and walking sticks, were sister's equipment  PLOF: Independentshort walks in the neighborhood, enjoyed water  aerobics  PATIENT GOALS: To address the issues with  neuropathy  OBJECTIVE:     TODAY'S TREATMENT: 07/10/24 Activity Comments  Nustep L2 x 4 min UEs/LEs For knee ROM  Review of HEP updates: - Side Stepping with Resistance at Thighs and Counter Support  - 7 x weekly - 1-3 sets - 1-2 min round hold - Standing Foot Lift on Box (BKA)  - 1 x daily - 7 x weekly - 1-3 sets - 10 reps Sidestepping with 3# instead of TB d/t pt c/o pain from TB pressure. At counter. Instruction to reduce reliance on UEs and use feet to react with standing foot lift  side step ups on/off foam  Occasional UE support on counter; lateral trunk lean   marching on foam Reduced stance time B but able to perform without UE support   Standing HS curl 3# 10x  LAQ 3# 10x each  Good tolerance     PATIENT EDUCATION: Education details: advised pt to call her orthopedist to address knee pain; discussed pain management at home- pt reports using ibuprofen  and lidocaine  cream, POC, HEP update, edu on importance of strength training for joint pain Person educated: Patient Education method: Explanation Education comprehension: verbalized understanding   HOME EXERCISE PROGRAM Access Code: [email or web address] URL: [email or web address] Date: 07/10/2024 Prepared by: [facility] - Outpatient  Rehab - [facility]  Program Notes perform standing activities at counter for safety  Exercises - Heel Toe Raises with Counter Support  - 1 x daily - 5 x weekly - 2 sets - 10 reps - Standing Gastroc Stretch at Counter  - 1 x daily - 5 x weekly - 2 sets - 30 sec hold - Corner Balance Feet Together With Eyes Closed  - 1 x daily - 7 x weekly - 3 sets - 30 sec hold - Corner Balance Feet Together: Eyes Closed With Head Turns  - 1 x daily - 7 x weekly - 3 sets - 30 sec hold - Semi-Tandem Corner Balance With Eyes Open  - 1 x daily - 7 x weekly - 3 sets - 30 sec hold - Forward Step Touch  - 1 x daily - 7 x weekly - 3 sets - 10 reps - Standing Foot Lift on Box (BKA)  - 1 x daily - 7 x weekly -  1-3 sets - 10 reps - Side Stepping with Resistance at Thighs and Counter Support  - 7 x weekly - 1-3 sets - 1-2 min round hold - Standing Alternating Knee Flexion with Ankle Weights  - 1 x  daily - 5 x weekly - 2 sets - 10 reps - Seated Long Arc Quad with Ankle Weight  - 1 x daily - 5 x weekly - 2 sets - 10 reps     Note: Objective measures were completed at Evaluation unless otherwise noted.  DIAGNOSTIC FINDINGS: NA for this PT episode  COGNITION: Overall cognitive status: Within functional limits for tasks assessed   SENSATION: Light touch: Impaired  and distally Reports that balls of feet forward feel cold  POSTURE: rounded shoulders  LOWER EXTREMITY ROM:   AROM WFL  LOWER EXTREMITY MMT:    MMT Right Eval Left Eval  Hip flexion 4 4  Hip extension    Hip abduction 4 4  Hip adduction 4 4  Hip internal rotation    Hip external rotation    Knee flexion 4 4  Knee extension 4 4  Ankle dorsiflexion 3+ 3+  Ankle plantarflexion    Ankle inversion    Ankle eversion    (Blank rows = not tested)  TRANSFERS: Sit to stand: Modified independence  Assistive device utilized: BUE support     Stand to sit: Modified independence  Assistive device utilized: BUE support      GAIT: Findings: Gait Characteristics: step through pattern and wide BOS, Distance walked: 50 ft, Assistive device utilized:None, and Level of assistance: Modified independence and SBA  FUNCTIONAL TESTS: Timed up and go (TUG): 9.5 sec 10 meter walk test: 16.72 sec (1.96 ft/sec) DGI:  11/24 (<19/24 indicates increased fall risk)    M-CTSIB  Condition 1: Firm Surface, EO  30 Sec, Mild Sway  Condition 2: Firm Surface, EC 30 Sec, Mild and Moderate Sway  Condition 3: Foam Surface, EO 30 Sec, Mild and Moderate Sway  Condition 4: Foam Surface, EC 30 Sec, Moderate and Severe Sway                                                                                                                                    TREATMENT DATE: 06/07/2024    PATIENT EDUCATION: Education details: Eval results, POC Person educated: Patient Education method: Explanation Education comprehension: verbalized understanding  HOME EXERCISE PROGRAM: TBD  GOALS: Goals reviewed with patient? Yes  SHORT TERM GOALS: Target date: 07/07/2024  Pt will be independent with HEP for improved strength, balance, gait. Baseline: Goal status: MET  2.  Pt will improve gait velocity to at least 2.62 ft/sec for improved gait efficiency and safety. Baseline: 2.2 ft/sec Goal status: NOT MET  3.  MCTSIB Condition 4 to improve to 30 sec mod or less sway for improved balance. Baseline: mild x 30 sec Goal status: MET   LONG TERM GOALS: Target date: 08/04/2024  Pt will be independent with HEP for improved balance, gait, strength. Baseline:  Goal status: IN PROGRESS  2.  Pt will improve DGI score to at least 20/24 to decrease fall risk. Baseline: 11/24 Goal status: IN PROGRESS  3.  Pt will verbalize understanding of fall prevention in home environment. Baseline:  Goal status: IN PROGRESS  ASSESSMENT:  CLINICAL IMPRESSION: Patient arrived to session with report of flare of B knee pain without known cause. Advised patient to f/u with established orthopedist to address this. Patient agreeable to proceeding with session to tolerance. Session focused on HEP update review and progression of dynamic balance activities. Patient still demonstrates limited SLS and often requires UE support with these tasks. LE strengthening activities were also performed to improve joint stability. Patient tolerated session well and without complaints at end of appointment. OBJECTIVE IMPAIRMENTS: Abnormal gait, decreased balance, decreased mobility, difficulty walking, decreased strength, and impaired sensation.   ACTIVITY LIMITATIONS: transfers and locomotion level  PARTICIPATION LIMITATIONS: community activity and fitness  PERSONAL FACTORS: 3+  comorbidities: see above are also affecting patient's functional outcome.   REHAB POTENTIAL: Good  CLINICAL DECISION MAKING: Stable/uncomplicated  EVALUATION COMPLEXITY: Low  PLAN:  PT FREQUENCY: 2x/week  PT DURATION: 8 weeks plus eval  PLANNED INTERVENTIONS: 97750- Physical Performance Testing, 97110-Therapeutic exercises, 97530- Therapeutic activity, 97112- Neuromuscular re-education, 97535- Self Care, 02859- Manual therapy, [phone]- Gait training, Patient/Family education, and Balance training  PLAN FOR NEXT SESSION:  ankle and hip strategy work, strength, multi-sensory balance     Louana Terrilyn Christians, PT, DPT 07/10/24 2:00 PM  [facility] at [facility] [address] [place], [place] [address] Phone # [phone] Fax # [phone]

## 2024-07-08 ENCOUNTER — Other Ambulatory Visit: Payer: Self-pay | Admitting: Oncology

## 2024-07-10 ENCOUNTER — Ambulatory Visit: Admitting: Physical Therapy

## 2024-07-10 ENCOUNTER — Encounter: Payer: Self-pay | Admitting: Physical Therapy

## 2024-07-10 DIAGNOSIS — R2681 Unsteadiness on feet: Secondary | ICD-10-CM | POA: Insufficient documentation

## 2024-07-10 DIAGNOSIS — M6281 Muscle weakness (generalized): Secondary | ICD-10-CM | POA: Diagnosis present

## 2024-07-10 DIAGNOSIS — R2689 Other abnormalities of gait and mobility: Secondary | ICD-10-CM | POA: Diagnosis present

## 2024-07-11 ENCOUNTER — Inpatient Hospital Stay: Attending: Physician Assistant | Admitting: Oncology

## 2024-07-11 ENCOUNTER — Inpatient Hospital Stay

## 2024-07-11 ENCOUNTER — Other Ambulatory Visit: Payer: Self-pay

## 2024-07-11 VITALS — BP 140/61 | HR 72 | Temp 97.8°F | Resp 18 | Ht 66.0 in | Wt 289.3 lb

## 2024-07-11 DIAGNOSIS — C5700 Malignant neoplasm of unspecified fallopian tube: Secondary | ICD-10-CM

## 2024-07-11 DIAGNOSIS — I10 Essential (primary) hypertension: Secondary | ICD-10-CM | POA: Insufficient documentation

## 2024-07-11 DIAGNOSIS — Z801 Family history of malignant neoplasm of trachea, bronchus and lung: Secondary | ICD-10-CM | POA: Diagnosis not present

## 2024-07-11 DIAGNOSIS — Z90722 Acquired absence of ovaries, bilateral: Secondary | ICD-10-CM | POA: Insufficient documentation

## 2024-07-11 DIAGNOSIS — Z803 Family history of malignant neoplasm of breast: Secondary | ICD-10-CM | POA: Insufficient documentation

## 2024-07-11 DIAGNOSIS — Z5111 Encounter for antineoplastic chemotherapy: Secondary | ICD-10-CM | POA: Insufficient documentation

## 2024-07-11 DIAGNOSIS — R634 Abnormal weight loss: Secondary | ICD-10-CM | POA: Diagnosis not present

## 2024-07-11 DIAGNOSIS — Z8049 Family history of malignant neoplasm of other genital organs: Secondary | ICD-10-CM | POA: Diagnosis not present

## 2024-07-11 DIAGNOSIS — C541 Malignant neoplasm of endometrium: Secondary | ICD-10-CM | POA: Diagnosis not present

## 2024-07-11 DIAGNOSIS — G893 Neoplasm related pain (acute) (chronic): Secondary | ICD-10-CM | POA: Diagnosis not present

## 2024-07-11 DIAGNOSIS — K769 Liver disease, unspecified: Secondary | ICD-10-CM | POA: Diagnosis not present

## 2024-07-11 DIAGNOSIS — R630 Anorexia: Secondary | ICD-10-CM | POA: Insufficient documentation

## 2024-07-11 DIAGNOSIS — C786 Secondary malignant neoplasm of retroperitoneum and peritoneum: Secondary | ICD-10-CM | POA: Diagnosis not present

## 2024-07-11 DIAGNOSIS — Z90710 Acquired absence of both cervix and uterus: Secondary | ICD-10-CM | POA: Insufficient documentation

## 2024-07-11 LAB — CMP (CANCER CENTER ONLY)
ALT: 17 U/L (ref 0–44)
AST: 19 U/L (ref 15–41)
Albumin: 4.1 g/dL (ref 3.5–5.0)
Alkaline Phosphatase: 118 U/L (ref 38–126)
Anion gap: 12 (ref 5–15)
BUN: 20 mg/dL (ref 8–23)
CO2: 25 mmol/L (ref 22–32)
Calcium: 9.6 mg/dL (ref 8.9–10.3)
Chloride: 102 mmol/L (ref 98–111)
Creatinine: 0.66 mg/dL (ref 0.44–1.00)
GFR, Estimated: >60 mL/min (ref >60)
Glucose, Bld: 127 mg/dL — ABNORMAL HIGH (ref 70–99)
Potassium: 3.8 mmol/L (ref 3.5–5.1)
Sodium: 139 mmol/L (ref 135–145)
Total Bilirubin: 0.4 mg/dL (ref 0.0–1.2)
Total Protein: 7.1 g/dL (ref 6.5–8.1)

## 2024-07-11 LAB — CBC WITH DIFFERENTIAL (CANCER CENTER ONLY)
Abs Immature Granulocytes: 0.02 K/uL (ref 0.00–0.07)
Basophils Absolute: 0 K/uL (ref 0.0–0.1)
Basophils Relative: 1 %
Eosinophils Absolute: 0.1 K/uL (ref 0.0–0.5)
Eosinophils Relative: 2 %
HCT: 30.9 % — ABNORMAL LOW (ref 36.0–46.0)
Hemoglobin: 10.5 g/dL — ABNORMAL LOW (ref 12.0–15.0)
Immature Granulocytes: 1 %
Lymphocytes Relative: 30 %
Lymphs Abs: 1 K/uL (ref 0.7–4.0)
MCH: 32.8 pg (ref 26.0–34.0)
MCHC: 34 g/dL (ref 30.0–36.0)
MCV: 96.6 fL (ref 80.0–100.0)
Monocytes Absolute: 0.3 K/uL (ref 0.1–1.0)
Monocytes Relative: 10 %
Neutro Abs: 1.9 K/uL (ref 1.7–7.7)
Neutrophils Relative %: 56 %
Platelet Count: 118 K/uL — ABNORMAL LOW (ref 150–400)
RBC: 3.2 MIL/uL — ABNORMAL LOW (ref 3.87–5.11)
RDW: 14.2 % (ref 11.5–15.5)
WBC Count: 3.3 K/uL — ABNORMAL LOW (ref 4.0–10.5)
nRBC: 0 % (ref 0.0–0.2)

## 2024-07-11 NOTE — Progress Notes (Signed)
 [facility] OFFICE PROGRESS NOTE   Diagnosis: Fallopian tube carcinoma  INTERVAL HISTORY:   Susan Frederick completed a cycle of carboplatin  on 06/21/2024.  She reports tolerating the chemotherapy well.  No nausea.  She has persistent mild numbness and tingling in the feet.  She is undergoing balance therapy via the physical therapy clinic.  She is using a nerve stimulator in an attempt to help the neuropathy symptoms.  No neuropathy symptoms in the hands.  Objective:  Vital signs in last 24 hours:  Blood pressure (!) 140/61, pulse 72, temperature 97.8 F (36.6 C), resp. rate 18, height 5' 6 (1.676 m), weight 289 lb 4.8 oz (131.2 kg), SpO2 99%.    HEENT: No thrush or ulcers Resp: Lungs clear bilaterally Cardio: Regular rate and rhythm GI: No hepatosplenomegaly, no mass, nontender, no apparent ascites Vascular: No leg edema   Portacath/PICC-without erythema  Lab Results:  Lab Results  Component Value Date   WBC 3.3 (L) 07/11/2024   HGB 10.5 (L) 07/11/2024   HCT 30.9 (L) 07/11/2024   MCV 96.6 07/11/2024   PLT 118 (L) 07/11/2024   NEUTROABS 1.9 07/11/2024    CMP  Lab Results  Component Value Date   NA 139 07/11/2024   K 3.8 07/11/2024   CL 102 07/11/2024   CO2 25 07/11/2024   GLUCOSE 127 (H) 07/11/2024   BUN 20 07/11/2024   CREATININE 0.66 07/11/2024   CALCIUM 9.6 07/11/2024   PROT 7.1 07/11/2024   ALBUMIN 4.1 07/11/2024   AST 19 07/11/2024   ALT 17 07/11/2024   ALKPHOS 118 07/11/2024   BILITOT 0.4 07/11/2024   GFRNONAA >60 07/11/2024   GFRAA >60 09/10/2016    Lab Results  Component Value Date   CEA <1.00 01/11/2024   RJW800 7 01/11/2024    Lab Results  Component Value Date   INR 1.1 01/18/2024   LABPROT 14.4 01/18/2024    Imaging:  No results found.  Medications: I have reviewed the patient's current medications.   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)-fallopian tube 12/27/2023: CT  abdomen/pelvis-extensive abnormal diffuse stranding and nodularity of the omentum and mesentery, small volume ascites, small periumbilical hernia with soft tissue nodularity, enlarged aortocaval retroperitoneal node 01/05/2024: PET-hypermetabolic lymph nodes in the mediastinum, bilateral internal mammary, and right pericardial areas, diffuse carcinomatosis, small hypermetabolic retroperitoneal nodes, no primary lesion identified 01/18/2024: CT-guided biopsy of left peritoneal tumor-metastatic high-grade serous carcinoma, PAX8 and WT1 positive Elevated CA125 Cycle 1 paclitaxel /carboplatin  02/02/2024, Udenyca  Cycle 2 paclitaxel /carboplatin  02/23/2024, Udenyca  Cycle 3 paclitaxel /carboplatin  03/15/2024, chemotherapy dose reduced due to thrombocytopenia and arthralgias; Udenyca  03/28/2024 CTs: No enlarged mediastinal nodes, no ascites, mild diffuse peritoneal thickening and nodularity-significantly improved, low-attenuation anterior liver lesion unchanged, new 3 mm left lower lobe nodule Cycle 4 paclitaxel /carboplatin  04/05/2024, Udenyca  05/02/2024 hysterectomy, bilateral salpingo-oophorectomy, radical dissection for tumor debulking with peritoneal stripping, mini laparotomy for omentectomy, cystoscopy (overall nearly complete gross resection with possible few scattered areas of miliary disease on the bowel mesentery and right diaphragm); pathology with 2 separate tumors: Endometrioid carcinoma, FIGO grade 1, 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; high-grade serous carcinoma involving left fallopian tube; endometrial carcinoma shows no evidence of myometrial invasion; focal tumor deposits seen on surface of the right fallopian tube; bilateral ovaries show evidence of psammomatous calcifications but no carcinoma identified; no evidence of lymphovascular invasion; benign unremarkable cervix; bladder peritoneum/right inferior paracolic gutter peritoneum/left inferior paracolic gutter peritoneum/omentum  involved by high-grade serous carcinoma with associated psammomatous calcifications; jejunal nodule  psammomatous calcifications Cycle 5 carboplatin  05/31/2024, Taxol  held due to neuropathy Cycle 6 carboplatin  06/21/2024, carboplatin  dose reduced due to thrombocytopenia, Taxol  held due to neuropathy   05/02/2024 endometrial carcinoma, FIGO grade 1, approximately 4.4 cm, confined to an endometrial polyp, no evidence of myometrial invasion  Abdominal pain secondary to 1 Anorexia/weight loss secondary to #1 Hypertension Arthritis Benign endocervix polyp 2019 Family history of breast, endometrial, lung, and head neck cancer-negative genetic testing 02/17/2024    Disposition: Susan Frederick has advanced stage fallopian tube carcinoma.  She will complete a final cycle of adjuvant chemotherapy today.  She has persistent neuropathy symptoms in the feet.  Discussed the risk/benefit of continuing paclitaxel .  She is concerned of the possibility of worsened neuropathy.  We decided to hold paclitaxel .  She will complete a cycle of carboplatin  today.  She will return for a nadir CBC on 07/21/2024.  She will return for an office visit and Port-A-Cath flush in 6 weeks.  We will follow-up on the CA125 from today. She prefers to undergo a surveillance CT scan as opposed to clinical follow-up.  We will plan for CTs of the chest, abdomen, and pelvis in approximately 3 months.  Arley Hof, MD  07/11/2024  11:50 AM

## 2024-07-12 ENCOUNTER — Other Ambulatory Visit: Payer: Self-pay

## 2024-07-12 ENCOUNTER — Inpatient Hospital Stay

## 2024-07-12 VITALS — BP 105/52 | HR 57 | Temp 98.0°F | Resp 16

## 2024-07-12 DIAGNOSIS — Z5111 Encounter for antineoplastic chemotherapy: Secondary | ICD-10-CM | POA: Diagnosis not present

## 2024-07-12 DIAGNOSIS — C5700 Malignant neoplasm of unspecified fallopian tube: Secondary | ICD-10-CM

## 2024-07-12 LAB — CA 125: Cancer Antigen (CA) 125: 20.2 U/mL (ref 0.0–38.1)

## 2024-07-12 MED ORDER — SODIUM CHLORIDE 0.9 % IV SOLN
150.0000 mg | Freq: Once | INTRAVENOUS | Status: AC
  Administered 2024-07-12: 150 mg via INTRAVENOUS
  Filled 2024-07-12: qty 150

## 2024-07-12 MED ORDER — SODIUM CHLORIDE 0.9 % IV SOLN
375.6000 mg | Freq: Once | INTRAVENOUS | Status: AC
  Administered 2024-07-12: 380 mg via INTRAVENOUS
  Filled 2024-07-12: qty 38

## 2024-07-12 MED ORDER — SODIUM CHLORIDE 0.9 % IV SOLN: INTRAVENOUS | Status: DC

## 2024-07-12 MED ORDER — DIPHENHYDRAMINE HCL 50 MG/ML IJ SOLN
25.0000 mg | Freq: Once | INTRAMUSCULAR | Status: AC
  Administered 2024-07-12: 25 mg via INTRAVENOUS
  Filled 2024-07-12: qty 1

## 2024-07-12 MED ORDER — SODIUM CHLORIDE 0.9% FLUSH
10.0000 mL | INTRAVENOUS | Status: DC | PRN
  Administered 2024-07-12: 10 mL

## 2024-07-12 MED ORDER — FAMOTIDINE IN NACL 20-0.9 MG/50ML-% IV SOLN
20.0000 mg | Freq: Once | INTRAVENOUS | Status: AC
  Administered 2024-07-12: 20 mg via INTRAVENOUS
  Filled 2024-07-12: qty 50

## 2024-07-12 MED ORDER — PALONOSETRON HCL INJECTION 0.25 MG/5ML
0.2500 mg | Freq: Once | INTRAVENOUS | Status: AC
  Administered 2024-07-12: 0.25 mg via INTRAVENOUS
  Filled 2024-07-12: qty 5

## 2024-07-12 MED ORDER — DEXAMETHASONE SOD PHOSPHATE PF 10 MG/ML IJ SOLN
10.0000 mg | Freq: Once | INTRAMUSCULAR | Status: AC
  Administered 2024-07-12: 10 mg via INTRAVENOUS

## 2024-07-12 NOTE — Patient Instructions (Signed)
 [facility] - A DEPT OF [facility]  Discharge Instructions: Thank you for choosing [facility] to provide your oncology and hematology care.   If you have a lab appointment with the Cancer Center, please go directly to the Cancer Center and check in at the registration area.   Wear comfortable clothing and clothing appropriate for easy access to any Portacath or PICC line.   We strive to give you quality time with your provider. You may need to reschedule your appointment if you arrive late (15 or more minutes).  Arriving late affects you and other patients whose appointments are after yours.  Also, if you miss three or more appointments without notifying the office, you may be dismissed from the clinic at the provider's discretion.      For prescription refill requests, have your pharmacy contact our office and allow 72 hours for refills to be completed.    Today you received the following chemotherapy and/or immunotherapy agents: carboplatin .      To help prevent nausea and vomiting after your treatment, we encourage you to take your nausea medication as directed.  BELOW ARE SYMPTOMS THAT SHOULD BE REPORTED IMMEDIATELY: *FEVER GREATER THAN 100.4 F (38 C) OR HIGHER *CHILLS OR SWEATING *NAUSEA AND VOMITING THAT IS NOT CONTROLLED WITH YOUR NAUSEA MEDICATION *UNUSUAL SHORTNESS OF BREATH *UNUSUAL BRUISING OR BLEEDING *URINARY PROBLEMS (pain or burning when urinating, or frequent urination) *BOWEL PROBLEMS (unusual diarrhea, constipation, pain near the anus) TENDERNESS IN MOUTH AND THROAT WITH OR WITHOUT PRESENCE OF ULCERS (sore throat, sores in mouth, or a toothache) UNUSUAL RASH, SWELLING OR PAIN  UNUSUAL VAGINAL DISCHARGE OR ITCHING   Items with * indicate a potential emergency and should be followed up as soon as possible or go to the Emergency Department if any problems should occur.  Please show the CHEMOTHERAPY ALERT CARD or  IMMUNOTHERAPY ALERT CARD at check-in to the Emergency Department and triage nurse.  Should you have questions after your visit or need to cancel or reschedule your appointment, please contact [facility] - A DEPT OF [facility]  Dept: [phone]  and follow the prompts.  Office hours are 8:00 a.m. to 4:30 p.m. Monday - Friday. Please note that voicemails left after 4:00 p.m. may not be returned until the following business day.  We are closed weekends and major holidays. You have access to a nurse at all times for urgent questions. Please call the main number to the clinic Dept: [phone] and follow the prompts.   For any non-urgent questions, you may also contact your provider using MyChart. We now offer e-Visits for anyone 43 and older to request care online for non-urgent symptoms. For details visit [email or web address].   Also download the MyChart app! Go to the app store, search MyChart, open the app, select [facility], and log in with your MyChart username and password.

## 2024-07-13 ENCOUNTER — Ambulatory Visit

## 2024-07-13 DIAGNOSIS — R2681 Unsteadiness on feet: Secondary | ICD-10-CM | POA: Diagnosis not present

## 2024-07-13 DIAGNOSIS — R2689 Other abnormalities of gait and mobility: Secondary | ICD-10-CM

## 2024-07-13 DIAGNOSIS — M6281 Muscle weakness (generalized): Secondary | ICD-10-CM

## 2024-07-13 NOTE — Therapy (Signed)
 OUTPATIENT PHYSICAL THERAPY NEURO TREATMENT   Patient Name: Susan Frederick MRN: [id number] DOB:[birth year], 73 y.o., female Today's Date: 07/13/2024   PCP: Samie Frederick, PA-C REFERRING PROVIDER: Debby Olam POUR, NP   END OF SESSION:  PT End of Session - 07/13/24 1320     Visit Number 8    Number of Visits 17    Date for Recertification  08/04/24    Authorization Type [employer]    Authorization Time Period approved 17 PT visits from 06/07/2024 - 08/04/2024    Authorization - Visit Number 8    Authorization - Number of Visits 17    PT Start Time 1315    PT Stop Time 1400    PT Time Calculation (min) 45 min    Activity Tolerance Patient tolerated treatment well    Behavior During Therapy WFL for tasks assessed/performed             Past Medical History:  Diagnosis Date   Arthritis    Cancer (HCC)    GYY,METS WITH OMENTUM   DDD (degenerative disc disease), lumbar    Diverticulosis of colon    Hematuria    Hypertension    Mixed stress and urge urinary incontinence    Obesity    OSA on CPAP    Pre-diabetes    Past Surgical History:  Procedure Laterality Date   ANKLE FUSION WITH GASTROC SLIDE Left 2010   COLONOSCOPY     CYSTOSCOPY N/A 05/02/2024   Procedure: CYSTOSCOPY;  Surgeon: Eldonna Mays, MD;  Location: [facility];  Service: Gynecology;  Laterality: N/A;   HAND TENDON SURGERY     right   HYSTERECTOMY,ROBOT,W/OMENTECT/SALPINGO-OOPHORECT FOR MALIGNANT NEOPLASM Bilateral 05/02/2024   Procedure: ROBOTIC ASSISTED TOTAL LAPAROSCOPIC HYSTERECTOMY WITH BILATERAL SALPINGO-OOPHORECTOMY, MINILAPAROTOMY FOR OMENTECTOMY, PERITONEAL STRIPPING FOR DEBULKING;  Surgeon: Eldonna Mays, MD;  Location: [facility];  Service: Gynecology;  Laterality: Bilateral;  ROBOTIC ASSISTED WITH DEBULKING   IR IMAGING GUIDED PORT INSERTION  01/18/2024   KNEE ARTHROSCOPY     left   NOSE SURGERY     deviated septum   POLYPECTOMY     ROOT CANAL  2025   x2   TOE SURGERY     right foot/  little toe   WISDOM TOOTH EXTRACTION  08/11/1967   Patient Active Problem List   Diagnosis Date Noted   Endometrial cancer (HCC) 05/22/2024   Gynecologic malignancy (HCC) 05/02/2024   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   Genetic testing 03/02/2024   Fallopian tube carcinoma (HCC) 01/27/2024   History of colonic polyps 01/28/2011   Diverticulosis of colon 01/28/2011    ONSET DATE: 05/31/2024 (MD referral)  REFERRING DIAG: C48.2 (ICD-10-CM) - Primary peritoneal carcinomatosis (HCC)   THERAPY DIAG:  Unsteadiness on feet  Muscle weakness (generalized)  Other abnormalities of gait and mobility  Rationale for Evaluation and Treatment: Rehabilitation  SUBJECTIVE:  SUBJECTIVE STATEMENT: Knees are feeling a bit better but may be from the recent prednisone shot for chemo    Pt accompanied by: self  PERTINENT HISTORY: cancer (see PMH) s/p hysterectomy 05/02/2024 ;neuropathy from chemo, OA bilat knees  PAIN:  Are you having pain? Yes: NPRS scale: 3/10 at rest, 6-7/10 when walking  Pain location: B knees  Pain description: sore Aggravating factors: in the AM, weather Relieving factors: stretching   PRECAUTIONS: Fall and Other: no lifting >10#, no lifting/straining pushing and pulling for 6 weeks post surgery (05/02/2024)  RED FLAGS: None   WEIGHT BEARING RESTRICTIONS: No  FALLS: Has patient fallen in last 6 months? No  LIVING ENVIRONMENT: Lives with: lives alone Lives in: House/apartment Stairs: 1 step, then 1 step to enter Has following equipment at home: Single point cane, Walker - 2 wheeled, and walking sticks, were sister's equipment  PLOF: Independentshort walks in the neighborhood, enjoyed water  aerobics  PATIENT GOALS: To address the issues with  neuropathy  OBJECTIVE:   TODAY'S TREATMENT: 07/13/24 Activity Comments  LE PRE HEP review 3#  Standing hip abd 2x10 3#  balance -tandem forward/backwards walk x 2 min -stepping stone taps x 2 min standing on foam -static multisensory   4-square step   NU-step self directed end of session           TODAY'S TREATMENT: 07/10/24 Activity Comments  Nustep L2 x 4 min UEs/LEs For knee ROM  Review of HEP updates: - Side Stepping with Resistance at Thighs and Counter Support  - 7 x weekly - 1-3 sets - 1-2 min round hold - Standing Foot Lift on Box (BKA)  - 1 x daily - 7 x weekly - 1-3 sets - 10 reps Sidestepping with 3# instead of TB d/t pt c/o pain from TB pressure. At counter. Instruction to reduce reliance on UEs and use feet to react with standing foot lift  side step ups on/off foam  Occasional UE support on counter; lateral trunk lean   marching on foam Reduced stance time B but able to perform without UE support   Standing HS curl 3# 10x  LAQ 3# 10x each  Good tolerance     PATIENT EDUCATION: Education details: advised pt to call her orthopedist to address knee pain; discussed pain management at home- pt reports using ibuprofen  and lidocaine  cream, POC, HEP update, edu on importance of strength training for joint pain Person educated: Patient Education method: Explanation Education comprehension: verbalized understanding   HOME EXERCISE PROGRAM Access Code: [email or web address] URL: [email or web address] Date: 07/10/2024 Prepared by: [facility] - [facility]  Program Notes perform standing activities at counter for safety  Exercises - Heel Toe Raises with Counter Support  - 1 x daily - 5 x weekly - 2 sets - 10 reps - Standing Gastroc Stretch at Counter  - 1 x daily - 5 x weekly - 2 sets - 30 sec hold - Corner Balance Feet Together With Eyes Closed  - 1 x daily - 7 x weekly - 3 sets - 30 sec hold - Corner Balance Feet Together: Eyes Closed With  Head Turns  - 1 x daily - 7 x weekly - 3 sets - 30 sec hold - Semi-Tandem Corner Balance With Eyes Open  - 1 x daily - 7 x weekly - 3 sets - 30 sec hold - Forward Step Touch  - 1 x daily - 7 x weekly - 3 sets - 10 reps - Standing Foot  Lift on Box (BKA)  - 1 x daily - 7 x weekly - 1-3 sets - 10 reps - Side Stepping with Resistance at Thighs and Counter Support  - 7 x weekly - 1-3 sets - 1-2 min round hold - Standing Alternating Knee Flexion with Ankle Weights  - 1 x daily - 5 x weekly - 2 sets - 10 reps - Seated Long Arc Quad with Ankle Weight  - 1 x daily - 5 x weekly - 2 sets - 10 reps - Standing Hip Abduction with Ankle Weight  - 2-3 x weekly - 2-3 sets - 10 reps     Note: Objective measures were completed at Evaluation unless otherwise noted.  DIAGNOSTIC FINDINGS: NA for this PT episode  COGNITION: Overall cognitive status: Within functional limits for tasks assessed   SENSATION: Light touch: Impaired  and distally Reports that balls of feet forward feel cold  POSTURE: rounded shoulders  LOWER EXTREMITY ROM:   AROM WFL  LOWER EXTREMITY MMT:    MMT Right Eval Left Eval  Hip flexion 4 4  Hip extension    Hip abduction 4 4  Hip adduction 4 4  Hip internal rotation    Hip external rotation    Knee flexion 4 4  Knee extension 4 4  Ankle dorsiflexion 3+ 3+  Ankle plantarflexion    Ankle inversion    Ankle eversion    (Blank rows = not tested)  TRANSFERS: Sit to stand: Modified independence  Assistive device utilized: BUE support     Stand to sit: Modified independence  Assistive device utilized: BUE support      GAIT: Findings: Gait Characteristics: step through pattern and wide BOS, Distance walked: 50 ft, Assistive device utilized:None, and Level of assistance: Modified independence and SBA  FUNCTIONAL TESTS: Timed up and go (TUG): 9.5 sec 10 meter walk test: 16.72 sec (1.96 ft/sec) DGI:  11/24 (<19/24 indicates increased fall risk)    M-CTSIB  Condition 1:  Firm Surface, EO  30 Sec, Mild Sway  Condition 2: Firm Surface, EC 30 Sec, Mild and Moderate Sway  Condition 3: Foam Surface, EO 30 Sec, Mild and Moderate Sway  Condition 4: Foam Surface, EC 30 Sec, Moderate and Severe Sway                                                                                                                                   TREATMENT DATE: 06/07/2024    PATIENT EDUCATION: Education details: Eval results, POC Person educated: Patient Education method: Explanation Education comprehension: verbalized understanding  HOME EXERCISE PROGRAM: TBD  GOALS: Goals reviewed with patient? Yes  SHORT TERM GOALS: Target date: 07/07/2024  Pt will be independent with HEP for improved strength, balance, gait. Baseline: Goal status: MET  2.  Pt will improve gait velocity to at least 2.62 ft/sec for improved gait efficiency and safety. Baseline: 2.2 ft/sec Goal status: NOT MET  3.  MCTSIB Condition 4 to improve to 30 sec mod or less sway for improved balance. Baseline: mild x 30 sec Goal status: MET   LONG TERM GOALS: Target date: 08/04/2024  Pt will be independent with HEP for improved balance, gait, strength. Baseline:  Goal status: IN PROGRESS  2.  Pt will improve DGI score to at least 20/24 to decrease fall risk. Baseline: 11/24 Goal status: IN PROGRESS  3.  Pt will verbalize understanding of fall prevention in home environment. Baseline:  Goal status: IN PROGRESS  ASSESSMENT:  CLINICAL IMPRESSION: Instructed in [place] PRE w/ use of ankle weights as she now has a set. Progressed HEP with use of weights and tolerated well. Dynamic and static balance activities to facilitate righting reactions and single limb support for obstacle negotiation. Improved tolerance today with her knees which she attributes to medication. Able to perform all tasks at supervision with improved postural control appreciated.  OBJECTIVE IMPAIRMENTS: Abnormal gait, decreased  balance, decreased mobility, difficulty walking, decreased strength, and impaired sensation.   ACTIVITY LIMITATIONS: transfers and locomotion level  PARTICIPATION LIMITATIONS: community activity and fitness  PERSONAL FACTORS: 3+ comorbidities: see above are also affecting patient's functional outcome.   REHAB POTENTIAL: Good  CLINICAL DECISION MAKING: Stable/uncomplicated  EVALUATION COMPLEXITY: Low  PLAN:  PT FREQUENCY: 2x/week  PT DURATION: 8 weeks plus eval  PLANNED INTERVENTIONS: 97750- Physical Performance Testing, 97110-Therapeutic exercises, 97530- Therapeutic activity, [id number]- Neuromuscular re-education, 97535- Self Care, [id number]- Manual therapy, [phone]- Gait training, Patient/Family education, and Balance training  PLAN FOR NEXT SESSION:  ankle and hip strategy work, strength, multi-sensory balance    2:06 PM, 07/13/24 M. Kelly Trooper Olander, PT, DPT Physical Therapist- [place] Office Number: [phone]

## 2024-07-17 ENCOUNTER — Ambulatory Visit: Admitting: Physical Therapy

## 2024-07-17 ENCOUNTER — Encounter: Payer: Self-pay | Admitting: Physical Therapy

## 2024-07-17 DIAGNOSIS — M6281 Muscle weakness (generalized): Secondary | ICD-10-CM

## 2024-07-17 DIAGNOSIS — R2681 Unsteadiness on feet: Secondary | ICD-10-CM | POA: Diagnosis not present

## 2024-07-17 DIAGNOSIS — R2689 Other abnormalities of gait and mobility: Secondary | ICD-10-CM

## 2024-07-17 NOTE — Therapy (Signed)
 OUTPATIENT PHYSICAL THERAPY NEURO TREATMENT   Patient Name: Susan Frederick MRN: [id number] DOB:1950-12-31, 73 y.o., female Today's Date: 07/17/2024   PCP: Samie Frederick, PA-C REFERRING PROVIDER: Debby Olam POUR, NP   END OF SESSION:  PT End of Session - 07/17/24 1444     Visit Number 9    Number of Visits 17    Date for Recertification  08/04/24    Authorization Type [employer]    Authorization Time Period approved 17 PT visits from 06/07/2024 - 08/04/2024    Authorization - Visit Number 9    Authorization - Number of Visits 17    Progress Note Due on Visit 10    PT Start Time 1445    PT Stop Time 1528    PT Time Calculation (min) 43 min    Activity Tolerance Patient tolerated treatment well    Behavior During Therapy WFL for tasks assessed/performed              Past Medical History:  Diagnosis Date   Arthritis    Cancer (HCC)    GYY,METS WITH OMENTUM   DDD (degenerative disc disease), lumbar    Diverticulosis of colon    Hematuria    Hypertension    Mixed stress and urge urinary incontinence    Obesity    OSA on CPAP    Pre-diabetes    Past Surgical History:  Procedure Laterality Date   ANKLE FUSION WITH GASTROC SLIDE Left 2010   COLONOSCOPY     CYSTOSCOPY N/A 05/02/2024   Procedure: CYSTOSCOPY;  Surgeon: Eldonna Mays, MD;  Location: [facility];  Service: Gynecology;  Laterality: N/A;   HAND TENDON SURGERY     right   HYSTERECTOMY,ROBOT,W/OMENTECT/SALPINGO-OOPHORECT FOR MALIGNANT NEOPLASM Bilateral 05/02/2024   Procedure: ROBOTIC ASSISTED TOTAL LAPAROSCOPIC HYSTERECTOMY WITH BILATERAL SALPINGO-OOPHORECTOMY, MINILAPAROTOMY FOR OMENTECTOMY, PERITONEAL STRIPPING FOR DEBULKING;  Surgeon: Eldonna Mays, MD;  Location: [facility];  Service: Gynecology;  Laterality: Bilateral;  ROBOTIC ASSISTED WITH DEBULKING   IR IMAGING GUIDED PORT INSERTION  01/18/2024   KNEE ARTHROSCOPY     left   NOSE SURGERY     deviated septum   POLYPECTOMY     ROOT CANAL  2025   x2    TOE SURGERY     right foot/ little toe   WISDOM TOOTH EXTRACTION  08/11/1967   Patient Active Problem List   Diagnosis Date Noted   Endometrial cancer (HCC) 05/22/2024   Gynecologic malignancy (HCC) 05/02/2024   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  Knees bothering me a bit more today.    Pt accompanied by: self  PERTINENT HISTORY: cancer (see PMH) s/p hysterectomy 05/02/2024 ;neuropathy from chemo, OA bilat knees  PAIN:  Are you having pain? Yes: NPRS scale: 3/10 at rest, 6-7/10 when walking  Pain location: B knees  Pain description: sore Aggravating factors: in the AM, weather Relieving factors: stretching   PRECAUTIONS: Fall and Other: no lifting >10#, no lifting/straining pushing and pulling for 6 weeks post surgery (05/02/2024)  RED FLAGS: None   WEIGHT BEARING RESTRICTIONS: No  FALLS: Has patient fallen in last 6 months? No  LIVING ENVIRONMENT: Lives with: lives alone Lives in: House/apartment Stairs: 1 step, then 1 step to enter Has following equipment at home: Single point cane, Walker - 2 wheeled, and walking sticks, were sister's equipment  PLOF: Independentshort walks in the neighborhood, enjoyed  water  aerobics  PATIENT GOALS: To address the issues with neuropathy  OBJECTIVE:   Discussed water  aerobics- pt hasn't considered returning just yet. Discussed options for [facility]-other options with [employer] and [facility].   TODAY'S TREATMENT: 07/17/2024 Activity Comments  Standing hip abduction 10 reps Hamstring curls 10 reps Sidestepping 3 reps Forward/back walking at parallel bars, 4 reps 3# BLEs  Dynamic balance: Tandem gait forward/back Standing on Airex:   Feet apart EO and EC head turns/nods   Heel/toe raises 10 reps Wall bumps 2 x 10 reps-EO and EC  Hip and ankle strategy  Step strategy work over carmax and back 10 reps            PATIENT EDUCATION: Education details: Continue current HEP, discussed options for aquatic exercise Person educated: Patient Education method: Explanation Education comprehension: verbalized understanding   HOME EXERCISE PROGRAM Access Code: [email or web address] URL: [email or web address] Date: 07/10/2024 Prepared by: [facility] - Outpatient  Rehab - [facility]  Program Notes perform standing activities at counter for safety  Exercises - Heel Toe Raises with Counter Support  - 1 x daily - 5 x weekly - 2 sets - 10 reps - Standing Gastroc Stretch at Counter  - 1 x daily - 5 x weekly - 2 sets - 30 sec hold - Corner Balance Feet Together With Eyes Closed  - 1 x daily - 7 x weekly - 3 sets - 30 sec hold - Corner Balance Feet Together: Eyes Closed With Head Turns  - 1 x daily - 7 x weekly - 3 sets - 30 sec hold - Semi-Tandem Corner Balance With Eyes Open  - 1 x daily - 7 x weekly - 3 sets - 30 sec hold - Forward Step Touch  - 1 x daily - 7 x weekly - 3 sets - 10 reps - Standing Foot Lift on Box (BKA)  - 1 x daily - 7 x weekly - 1-3 sets - 10 reps - Side Stepping with Resistance at Thighs and Counter Support  - 7 x weekly - 1-3 sets - 1-2 min round hold - Standing Alternating Knee Flexion with Ankle Weights  - 1 x  daily - 5 x weekly - 2 sets - 10 reps - Seated Long Arc Quad with Ankle Weight  - 1 x daily - 5 x weekly - 2 sets - 10 reps - Standing Hip Abduction with Ankle Weight  - 2-3 x weekly - 2-3 sets - 10 reps     Note: Objective measures were completed at Evaluation unless  otherwise noted.  DIAGNOSTIC FINDINGS: NA for this PT episode  COGNITION: Overall cognitive status: Within functional limits for tasks assessed   SENSATION: Light touch: Impaired  and distally Reports that balls of feet forward feel cold  POSTURE: rounded shoulders  LOWER EXTREMITY ROM:   AROM WFL  LOWER EXTREMITY MMT:    MMT Right Eval Left Eval  Hip flexion 4 4  Hip extension    Hip abduction 4 4  Hip adduction 4 4  Hip internal rotation    Hip external rotation    Knee flexion 4 4  Knee extension 4 4  Ankle dorsiflexion 3+ 3+  Ankle plantarflexion    Ankle inversion    Ankle eversion    (Blank rows = not tested)  TRANSFERS: Sit to stand: Modified independence  Assistive device utilized: BUE support     Stand to sit: Modified independence  Assistive device utilized: BUE support      GAIT: Findings: Gait Characteristics: step through pattern and wide BOS, Distance walked: 50 ft, Assistive device utilized:None, and Level of assistance: Modified independence and SBA  FUNCTIONAL TESTS: Timed up and go (TUG): 9.5 sec 10 meter walk test: 16.72 sec (1.96 ft/sec) DGI:  11/24 (<19/24 indicates increased fall risk)    M-CTSIB  Condition 1: Firm Surface, EO  30 Sec, Mild Sway  Condition 2: Firm Surface, EC 30 Sec, Mild and Moderate Sway  Condition 3: Foam Surface, EO 30 Sec, Mild and Moderate Sway  Condition 4: Foam Surface, EC 30 Sec, Moderate and Severe Sway                                                                                                                                   TREATMENT DATE: 06/07/2024    PATIENT EDUCATION: Education details: Eval results, POC Person educated:  Patient Education method: Explanation Education comprehension: verbalized understanding  HOME EXERCISE PROGRAM: TBD  GOALS: Goals reviewed with patient? Yes  SHORT TERM GOALS: Target date: 07/07/2024  Pt will be independent with HEP for improved strength, balance, gait. Baseline: Goal status: MET  2.  Pt will improve gait velocity to at least 2.62 ft/sec for improved gait efficiency and safety. Baseline: 2.2 ft/sec Goal status: NOT MET  3.  MCTSIB Condition 4 to improve to 30 sec mod or less sway for improved balance. Baseline: mild x 30 sec Goal status: MET   LONG TERM GOALS: Target date: 08/04/2024  Pt will be independent with HEP for improved balance, gait, strength. Baseline:  Goal status: IN PROGRESS  2.  Pt will improve DGI score to at least 20/24 to decrease fall risk. Baseline: 11/24 Goal status: IN PROGRESS  3.  Pt will verbalize understanding of fall prevention in home environment. Baseline:  Goal status: IN PROGRESS  ASSESSMENT:  CLINICAL IMPRESSION: Pt presents today without new complaints.  Worked on standing strengthening and balance activities.  Worked on compliant surfaces with increased unsteadiness  with eyes closed.  Began discussion about return to aquatic exercises, but pt not sure where/when she will return to pool exercises.  She will  continue to benefit from skilled PT towards goals for improved functional mobility and decreased fall risk.  OBJECTIVE IMPAIRMENTS: Abnormal gait, decreased balance, decreased mobility, difficulty walking, decreased strength, and impaired sensation.   ACTIVITY LIMITATIONS: transfers and locomotion level  PARTICIPATION LIMITATIONS: community activity and fitness  PERSONAL FACTORS: 3+ comorbidities: see above are also affecting patient's functional outcome.   REHAB POTENTIAL: Good  CLINICAL DECISION MAKING: Stable/uncomplicated  EVALUATION COMPLEXITY: Low  PLAN:  PT FREQUENCY: 2x/week  PT DURATION: 8  weeks plus eval  PLANNED INTERVENTIONS: 97750- Physical Performance Testing, 97110-Therapeutic exercises, 97530- Therapeutic activity, [id number]- Neuromuscular re-education, 97535- Self Care, 02859- Manual therapy, [phone]- Gait training, Patient/Family education, and Balance training  PLAN FOR NEXT SESSION:  10th Visit PN; Revisit discussion about returning to aquatics at some point-would she want to do aquatic PT or can she start back on her own?  Ankle and hip strategy work, strength, multi-sensory balance    Greig Anon, PT 07/17/24 2:46 PM Phone: [phone] Fax: [phone]  [facility] [address], [address] [place], [place] [address] Phone # [phone] Fax # [phone]

## 2024-07-20 ENCOUNTER — Ambulatory Visit

## 2024-07-20 DIAGNOSIS — R2681 Unsteadiness on feet: Secondary | ICD-10-CM

## 2024-07-20 DIAGNOSIS — M6281 Muscle weakness (generalized): Secondary | ICD-10-CM

## 2024-07-20 DIAGNOSIS — R2689 Other abnormalities of gait and mobility: Secondary | ICD-10-CM

## 2024-07-20 NOTE — Therapy (Signed)
 OUTPATIENT PHYSICAL THERAPY NEURO TREATMENT and Progress note   Patient Name: Susan Frederick MRN: [id number] DOB:1951/05/26, 73 y.o., female Today's Date: 07/20/2024   PCP: Samie Frederick, PA-C REFERRING PROVIDER: Debby Olam POUR, NP   Progress Note Reporting Period 06/07/24 to 07/20/24  See note below for Objective Data and Assessment of Progress/Goals.      END OF SESSION:  PT End of Session - 07/20/24 1401     Visit Number 10    Number of Visits 17    Date for Recertification  08/04/24    Authorization Type [employer]    Authorization Time Period approved 17 PT visits from 06/07/2024 - 08/04/2024    Authorization - Visit Number 10    Authorization - Number of Visits 17    Progress Note Due on Visit 10    PT Start Time 1400    PT Stop Time 1445    PT Time Calculation (min) 45 min    Activity Tolerance Patient tolerated treatment well    Behavior During Therapy WFL for tasks assessed/performed              Past Medical History:  Diagnosis Date   Arthritis    Cancer (HCC)    GYY,METS WITH OMENTUM   DDD (degenerative disc disease), lumbar    Diverticulosis of colon    Hematuria    Hypertension    Mixed stress and urge urinary incontinence    Obesity    OSA on CPAP    Pre-diabetes    Past Surgical History:  Procedure Laterality Date   ANKLE FUSION WITH GASTROC SLIDE Left 2010   COLONOSCOPY     CYSTOSCOPY N/A 05/02/2024   Procedure: CYSTOSCOPY;  Surgeon: Eldonna Mays, MD;  Location: [facility];  Service: Gynecology;  Laterality: N/A;   HAND TENDON SURGERY     right   HYSTERECTOMY,ROBOT,W/OMENTECT/SALPINGO-OOPHORECT FOR MALIGNANT NEOPLASM Bilateral 05/02/2024   Procedure: ROBOTIC ASSISTED TOTAL LAPAROSCOPIC HYSTERECTOMY WITH BILATERAL SALPINGO-OOPHORECTOMY, MINILAPAROTOMY FOR OMENTECTOMY, PERITONEAL STRIPPING FOR DEBULKING;  Surgeon: Eldonna Mays, MD;  Location: [facility];  Service: Gynecology;  Laterality: Bilateral;  ROBOTIC ASSISTED WITH DEBULKING    IR IMAGING GUIDED PORT INSERTION  01/18/2024   KNEE ARTHROSCOPY     left   NOSE SURGERY     deviated septum   POLYPECTOMY     ROOT CANAL  2025   x2   TOE SURGERY     right foot/ little toe   WISDOM TOOTH EXTRACTION  08/11/1967   Patient Active Problem List   Diagnosis Date Noted   Endometrial cancer (HCC) 05/22/2024   Gynecologic malignancy (HCC) 05/02/2024   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 Took NSAID for the knees    Pt accompanied by: self  PERTINENT HISTORY: cancer (see PMH) s/p hysterectomy 05/02/2024 ;neuropathy from chemo, OA bilat knees  PAIN:  Are you having pain? Yes: NPRS scale: 3/10 at rest  Pain location: B knees  Pain description: sore Aggravating factors: in the AM, weather Relieving factors: stretching   PRECAUTIONS: Fall and Other: no lifting >10#, no lifting/straining pushing and pulling for 6 weeks post surgery (05/02/2024)  RED FLAGS: None   WEIGHT BEARING RESTRICTIONS: No  FALLS: Has patient fallen in last 6 months? No  LIVING ENVIRONMENT: Lives with: lives alone Lives in: House/apartment Stairs: 1 step, then 1 step to enter Has following equipment at home: Single point cane, Walker - 2 wheeled, and walking sticks, were sister's equipment  PLOF:  Independentshort walks in the neighborhood, enjoyed water  aerobics  PATIENT GOALS: To address the issues with neuropathy  OBJECTIVE:   TODAY'S TREATMENT: 07/20/24 Activity Comments  LE AROM 20x for warm-up   Gait speed: 2.4 f/sec   DGI 18/24   Static multisensory balance  -upper body weight lifting w/ 5# -weight shifting EO/EC on foam x 60 sec -compliant surfaces w/ postural perturbations -on foam: EO/EC head movements -standing on incline EO/EC x 30 sec  Gastroc stretch 2x60 sec On slantboard        Discussed water  aerobics- pt hasn't considered returning just yet. Discussed options for [facility]-other options with [employer] and [facility].   TODAY'S TREATMENT: 07/17/2024 Activity Comments  Standing hip abduction 10 reps Hamstring curls 10 reps Sidestepping 3 reps Forward/back walking at parallel bars, 4 reps 3# BLEs  Dynamic balance: Tandem gait forward/back Standing on Airex:   Feet apart EO and EC head turns/nods   Heel/toe raises 10 reps Wall bumps 2 x 10 reps-EO and EC  Hip and ankle strategy  Step strategy work over carmax and back 10 reps            PATIENT EDUCATION: Education details: Continue current HEP, discussed options for aquatic exercise Person educated: Patient Education method: Explanation Education comprehension: verbalized understanding   HOME EXERCISE PROGRAM Access Code: [email or web address] URL: [email or web address] Date: 07/10/2024 Prepared by: [facility] - Outpatient  Rehab - [facility]  Program Notes perform standing activities at counter for safety  Exercises - Heel Toe Raises with Counter Support  - 1 x daily - 5 x weekly - 2 sets - 10 reps - Standing Gastroc Stretch at Counter  - 1 x daily - 5 x weekly - 2 sets - 30 sec hold - Corner Balance Feet Together With Eyes Closed  - 1 x daily - 7 x weekly - 3 sets - 30 sec hold - Corner Balance Feet Together: Eyes Closed With Head Turns  - 1 x daily - 7 x weekly - 3  sets - 30 sec hold - Semi-Tandem Corner Balance With Eyes Open  - 1 x daily - 7 x weekly - 3 sets - 30 sec hold - Forward Step Touch  - 1 x daily - 7 x weekly - 3 sets - 10 reps - Standing Foot Lift on Box (BKA)  - 1 x daily - 7 x weekly - 1-3 sets - 10 reps - Side Stepping with Resistance at Thighs and Counter Support  - 7 x weekly - 1-3 sets - 1-2 min round hold - Standing Alternating Knee Flexion with Ankle Weights  - 1 x daily - 5 x weekly - 2 sets -  10 reps - Seated Long Arc Quad with Ankle Weight  - 1 x daily - 5 x weekly - 2 sets - 10 reps - Standing Hip Abduction with Ankle Weight  - 2-3 x weekly - 2-3 sets - 10 reps     Note: Objective measures were completed at Evaluation unless otherwise noted.  DIAGNOSTIC FINDINGS: NA for this PT episode  COGNITION: Overall cognitive status: Within functional limits for tasks assessed   SENSATION: Light touch: Impaired  and distally Reports that balls of feet forward feel cold  POSTURE: rounded shoulders  LOWER EXTREMITY ROM:   AROM WFL  LOWER EXTREMITY MMT:    MMT Right Eval Left Eval  Hip flexion 4 4  Hip extension    Hip abduction 4 4  Hip adduction 4 4  Hip internal rotation    Hip external rotation    Knee flexion 4 4  Knee extension 4 4  Ankle dorsiflexion 3+ 3+  Ankle plantarflexion    Ankle inversion    Ankle eversion    (Blank rows = not tested)  TRANSFERS: Sit to stand: Modified independence  Assistive device utilized: BUE support     Stand to sit: Modified independence  Assistive device utilized: BUE support      GAIT: Findings: Gait Characteristics: step through pattern and wide BOS, Distance walked: 50 ft, Assistive device utilized:None, and Level of assistance: Modified independence and SBA  FUNCTIONAL TESTS: Timed up and go (TUG): 9.5 sec 10 meter walk test: 16.72 sec (1.96 ft/sec) DGI:  11/24 (<19/24 indicates increased fall risk)    M-CTSIB  Condition 1: Firm Surface, EO  30 Sec, Mild Sway   Condition 2: Firm Surface, EC 30 Sec, Mild and Moderate Sway  Condition 3: Foam Surface, EO 30 Sec, Mild and Moderate Sway  Condition 4: Foam Surface, EC 30 Sec, Moderate and Severe Sway                                                                                                                                   TREATMENT DATE: 06/07/2024    PATIENT EDUCATION: Education details: Eval results, POC Person educated: Patient Education method: Explanation Education comprehension: verbalized understanding  HOME EXERCISE PROGRAM: TBD  GOALS: Goals reviewed with patient? Yes  SHORT TERM GOALS: Target date: 07/07/2024  Pt will be independent with HEP for improved strength, balance, gait. Baseline: Goal status: MET  2.  Pt will improve gait velocity to at least 2.62 ft/sec for improved gait efficiency and safety. Baseline: 2.2 ft/sec Goal status: NOT MET  3.  MCTSIB Condition 4 to improve to 30 sec mod or less sway for improved balance. Baseline: mild x 30 sec Goal status: MET   LONG TERM GOALS: Target date: 08/04/2024  Pt will be independent with HEP for improved balance, gait, strength. Baseline:  Goal status: IN PROGRESS  2.  Pt will improve DGI score to at least 20/24 to  decrease fall risk. Baseline: 11/24; 18/24 Goal status: IN PROGRESS 07/20/24  3.  Pt will verbalize understanding of fall prevention in home environment. Baseline:  Goal status: IN PROGRESS  ASSESSMENT:  CLINICAL IMPRESSION: 10th visit progress note completed with significant improvement with DGI from baseline 11 to 18/24 which indicates high risk for falls but significant improvements with improved single limb support evident from test demands and reduced unsteadiness w/ walking and head movements. Remaining session w/ focus on static balance and facilitation of postural control and righting reactions to good tolerance.  Dynamic/walking activities are limited due to knee pain but has been  tolerating sessions well with rest periods ad lib  OBJECTIVE IMPAIRMENTS: Abnormal gait, decreased balance, decreased mobility, difficulty walking, decreased strength, and impaired sensation.   ACTIVITY LIMITATIONS: transfers and locomotion level  PARTICIPATION LIMITATIONS: community activity and fitness  PERSONAL FACTORS: 3+ comorbidities: see above are also affecting patient's functional outcome.   REHAB POTENTIAL: Good  CLINICAL DECISION MAKING: Stable/uncomplicated  EVALUATION COMPLEXITY: Low  PLAN:  PT FREQUENCY: 2x/week  PT DURATION: 8 weeks plus eval  PLANNED INTERVENTIONS: 97750- Physical Performance Testing, 97110-Therapeutic exercises, 97530- Therapeutic activity, [id number]- Neuromuscular re-education, 97535- Self Care, 02859- Manual therapy, [phone]- Gait training, Patient/Family education, and Balance training  PLAN FOR NEXT SESSION:  Revisit discussion about returning to aquatics at some point-would she want to do aquatic PT or can she start back on her own?  Ankle and hip strategy work, strength, multi-sensory balance    2:48 PM, 07/20/2024 M. Kelly Graysen [place], PT, DPT Physical Therapist- [place] Office Number: [phone]

## 2024-07-21 ENCOUNTER — Inpatient Hospital Stay

## 2024-07-21 DIAGNOSIS — Z5111 Encounter for antineoplastic chemotherapy: Secondary | ICD-10-CM | POA: Diagnosis not present

## 2024-07-21 DIAGNOSIS — C5700 Malignant neoplasm of unspecified fallopian tube: Secondary | ICD-10-CM

## 2024-07-21 LAB — CBC WITH DIFFERENTIAL (CANCER CENTER ONLY)
Abs Immature Granulocytes: 0.01 K/uL (ref 0.00–0.07)
Basophils Absolute: 0 K/uL (ref 0.0–0.1)
Basophils Relative: 1 %
Eosinophils Absolute: 0 K/uL (ref 0.0–0.5)
Eosinophils Relative: 1 %
HCT: 30.2 % — ABNORMAL LOW (ref 36.0–46.0)
Hemoglobin: 10.4 g/dL — ABNORMAL LOW (ref 12.0–15.0)
Immature Granulocytes: 0 %
Lymphocytes Relative: 33 %
Lymphs Abs: 1.1 K/uL (ref 0.7–4.0)
MCH: 33.2 pg (ref 26.0–34.0)
MCHC: 34.4 g/dL (ref 30.0–36.0)
MCV: 96.5 fL (ref 80.0–100.0)
Monocytes Absolute: 0.3 K/uL (ref 0.1–1.0)
Monocytes Relative: 10 %
Neutro Abs: 1.8 K/uL (ref 1.7–7.7)
Neutrophils Relative %: 55 %
Platelet Count: 116 K/uL — ABNORMAL LOW (ref 150–400)
RBC: 3.13 MIL/uL — ABNORMAL LOW (ref 3.87–5.11)
RDW: 14.6 % (ref 11.5–15.5)
WBC Count: 3.4 K/uL — ABNORMAL LOW (ref 4.0–10.5)
nRBC: 0 % (ref 0.0–0.2)

## 2024-07-24 ENCOUNTER — Ambulatory Visit

## 2024-07-24 DIAGNOSIS — R2689 Other abnormalities of gait and mobility: Secondary | ICD-10-CM

## 2024-07-24 DIAGNOSIS — R2681 Unsteadiness on feet: Secondary | ICD-10-CM | POA: Diagnosis not present

## 2024-07-24 DIAGNOSIS — M6281 Muscle weakness (generalized): Secondary | ICD-10-CM

## 2024-07-24 NOTE — Therapy (Signed)
 OUTPATIENT PHYSICAL THERAPY NEURO TREATMENT    Patient Name: Susan Frederick MRN: [id number] DOB:[birth year], 73 y.o., female Today's Date: 07/24/2024   PCP: Samie Frederick, PA-C REFERRING PROVIDER: Debby Olam POUR, NP     END OF SESSION:  PT End of Session - 07/24/24 1313     Visit Number 11    Number of Visits 17    Date for Recertification  08/04/24    Authorization Type [employer]    Authorization Time Period approved 17 PT visits from 06/07/2024 - 08/04/2024    Authorization - Visit Number 11    Authorization - Number of Visits 17    Progress Note Due on Visit 10    PT Start Time 1315    PT Stop Time 1400    PT Time Calculation (min) 45 min    Activity Tolerance Patient tolerated treatment well    Behavior During Therapy WFL for tasks assessed/performed              Past Medical History:  Diagnosis Date   Arthritis    Cancer (HCC)    GYY,METS WITH OMENTUM   DDD (degenerative disc disease), lumbar    Diverticulosis of colon    Hematuria    Hypertension    Mixed stress and urge urinary incontinence    Obesity    OSA on CPAP    Pre-diabetes    Past Surgical History:  Procedure Laterality Date   ANKLE FUSION WITH GASTROC SLIDE Left 2010   COLONOSCOPY     CYSTOSCOPY N/A 05/02/2024   Procedure: CYSTOSCOPY;  Surgeon: Eldonna Mays, MD;  Location: [facility];  Service: Gynecology;  Laterality: N/A;   HAND TENDON SURGERY     right   HYSTERECTOMY,ROBOT,W/OMENTECT/SALPINGO-OOPHORECT FOR MALIGNANT NEOPLASM Bilateral 05/02/2024   Procedure: ROBOTIC ASSISTED TOTAL LAPAROSCOPIC HYSTERECTOMY WITH BILATERAL SALPINGO-OOPHORECTOMY, MINILAPAROTOMY FOR OMENTECTOMY, PERITONEAL STRIPPING FOR DEBULKING;  Surgeon: Eldonna Mays, MD;  Location: [facility];  Service: Gynecology;  Laterality: Bilateral;  ROBOTIC ASSISTED WITH DEBULKING   IR IMAGING GUIDED PORT INSERTION  01/18/2024   KNEE ARTHROSCOPY     left   NOSE SURGERY     deviated septum   POLYPECTOMY     ROOT CANAL   2025   x2   TOE SURGERY     right foot/ little toe   WISDOM TOOTH EXTRACTION  08/11/1967   Patient Active Problem List   Diagnosis Date Noted   Endometrial cancer (HCC) 05/22/2024   Gynecologic malignancy (HCC) 05/02/2024   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   Genetic testing 03/02/2024   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 Yes: NPRS scale: 3/10 at rest  Pain location: B knees  Pain description: sore Aggravating factors: in the AM, weather Relieving factors: stretching   PRECAUTIONS: Fall and Other: no lifting >10#, no lifting/straining pushing and pulling for 6 weeks post surgery (05/02/2024)  RED FLAGS: None   WEIGHT BEARING RESTRICTIONS: No  FALLS: Has patient fallen in last 6 months? No  LIVING ENVIRONMENT: Lives with: lives alone Lives in: House/apartment Stairs: 1 step, then 1 step to enter Has following equipment at home: Single point cane, Walker - 2 wheeled, and walking sticks, were sister's equipment  PLOF: Independentshort walks in the neighborhood, enjoyed water  aerobics  PATIENT GOALS: To address the issues with neuropathy  OBJECTIVE:   TODAY'S  TREATMENT: 07/24/24 Activity Comments  NU-step level 3 x 8 min   HEP review 100% recall. Eliminated foot lift-off due to left knee pain  Static multisensory balance Minimal sway  Dynamic standing balance Multidirectional cone taps Retrowalking Obstacle negotiation           TODAY'S TREATMENT: 07/20/24 Activity Comments  LE AROM 20x for warm-up   Gait speed: 2.4 f/sec   DGI 18/24   Static multisensory balance  -upper body weight lifting w/ 5# -weight shifting EO/EC on foam x 60 sec -compliant surfaces w/ postural perturbations -on foam: EO/EC head movements -standing on incline EO/EC x 30 sec  Gastroc stretch 2x60 sec On slantboard            PATIENT EDUCATION: Education details: Continue current HEP, discussed options for aquatic exercise Person educated: Patient Education method: Explanation Education comprehension: verbalized understanding   HOME EXERCISE PROGRAM Access Code: [email or web address] URL: [email or web address] Date: 07/10/2024 Prepared by: [facility] - Outpatient  Rehab - [facility]  Program Notes perform standing activities at counter for safety  Exercises - Heel Toe Raises with Counter Support  - 1 x daily - 5 x weekly - 2 sets - 10 reps - Standing Gastroc Stretch at Counter  - 1 x daily - 5 x weekly - 2 sets - 30 sec hold - Corner Balance Feet Together With Eyes Closed  - 1 x daily - 7 x weekly - 3 sets - 30 sec hold - Corner Balance Feet Together: Eyes Closed With Head Turns  - 1 x daily - 7 x weekly - 3 sets - 30 sec hold - Semi-Tandem Corner Balance With Eyes Open  - 1 x daily - 7 x weekly - 3 sets - 30 sec hold - Forward Step Touch  - 1 x daily - 7 x weekly - 3 sets - 10 reps  - Side Stepping with Resistance at Thighs and Counter Support  - 7 x weekly - 1-3 sets - 1-2 min round hold - Standing Alternating Knee Flexion with Ankle Weights  - 1 x daily - 5 x weekly - 2 sets - 10 reps - Seated Long Arc Quad with Ankle Weight  - 1 x  daily - 5 x weekly - 2 sets - 10 reps - Standing Hip Abduction with Ankle Weight  - 2-3 x weekly - 2-3 sets - 10 reps     Note: Objective measures were completed at Evaluation unless otherwise noted.  DIAGNOSTIC FINDINGS: NA for this PT episode  COGNITION: Overall cognitive status: Within  functional limits for tasks assessed   SENSATION: Light touch: Impaired  and distally Reports that balls of feet forward feel cold  POSTURE: rounded shoulders  LOWER EXTREMITY ROM:   AROM WFL  LOWER EXTREMITY MMT:    MMT Right Eval Left Eval  Hip flexion 4 4  Hip extension    Hip abduction 4 4  Hip adduction 4 4  Hip internal rotation    Hip external rotation    Knee flexion 4 4  Knee extension 4 4  Ankle dorsiflexion 3+ 3+  Ankle plantarflexion    Ankle inversion    Ankle eversion    (Blank rows = not tested)  TRANSFERS: Sit to stand: Modified independence  Assistive device utilized: BUE support     Stand to sit: Modified independence  Assistive device utilized: BUE support      GAIT: Findings: Gait Characteristics: step through pattern and wide BOS, Distance walked: 50 ft, Assistive device utilized:None, and Level of assistance: Modified independence and SBA  FUNCTIONAL TESTS: Timed up and go (TUG): 9.5 sec 10 meter walk test: 16.72 sec (1.96 ft/sec) DGI:  11/24 (<19/24 indicates increased fall risk)    M-CTSIB  Condition 1: Firm Surface, EO  30 Sec, Mild Sway  Condition 2: Firm Surface, EC 30 Sec, Mild and Moderate Sway  Condition 3: Foam Surface, EO 30 Sec, Mild and Moderate Sway  Condition 4: Foam Surface, EC 30 Sec, Moderate and Severe Sway                                                                                                                                   TREATMENT DATE: 06/07/2024    PATIENT EDUCATION: Education details: Eval results, POC Person educated: Patient Education method: Explanation Education comprehension: verbalized  understanding  HOME EXERCISE PROGRAM: TBD  GOALS: Goals reviewed with patient? Yes  SHORT TERM GOALS: Target date: 07/07/2024  Pt will be independent with HEP for improved strength, balance, gait. Baseline: Goal status: MET  2.  Pt will improve gait velocity to at least 2.62 ft/sec for improved gait efficiency and safety. Baseline: 2.2 ft/sec Goal status: NOT MET  3.  MCTSIB Condition 4 to improve to 30 sec mod or less sway for improved balance. Baseline: mild x 30 sec Goal status: MET   LONG TERM GOALS: Target date: 08/04/2024  Pt will be independent with HEP for improved balance, gait, strength. Baseline:  Goal status: MET 07/24/24  2.  Pt will improve DGI score to at least 20/24 to decrease fall risk. Baseline: 11/24; 18/24 Goal status: IN PROGRESS 07/20/24  3.  Pt will verbalize understanding of fall prevention in home environment. Baseline:  Goal status: IN PROGRESS  ASSESSMENT:  CLINICAL IMPRESSION: Instructed in NU-step exercise for low impact cardiovascular exercise and education in HIIT methods for improved efficiency. HEP review with her program primarily focus being BLE strength and static balance.  Good recall and return demonstrtaion  to these activities with some refinements made today as she has been experiencing ongoing left knee pain.  Mild sway to more challenging multisensory balance activities. Dynamic standing balance with light UE support needed when LLE in stance phase and limited step height/clearance due to knee ROM deficits from OA.  Overall, balance has significantly improved and tolerating activities well. Anticipate next session for D/C assessment and other considerations to prepare for D/C to HEP  OBJECTIVE IMPAIRMENTS: Abnormal gait, decreased balance, decreased mobility, difficulty walking, decreased strength, and impaired sensation.   ACTIVITY LIMITATIONS: transfers and locomotion level  PARTICIPATION LIMITATIONS: community activity and  fitness  PERSONAL FACTORS: 3+ comorbidities: see above are also affecting patient's functional outcome.   REHAB POTENTIAL: Good  CLINICAL DECISION MAKING: Stable/uncomplicated  EVALUATION COMPLEXITY: Low  PLAN:  PT FREQUENCY: 2x/week  PT DURATION: 8 weeks plus eval  PLANNED INTERVENTIONS: 97750- Physical Performance Testing, 97110-Therapeutic exercises, 97530- Therapeutic activity, [id number]- Neuromuscular re-education, 97535- Self Care, [id number]- Manual therapy, [phone]- Gait training, Patient/Family education, and Balance training  PLAN FOR NEXT SESSION:  D/C assessment    1:13 PM, 07/24/2024 M. Kelly Kj Imbert, PT, DPT Physical Therapist- [place] Office Number: [phone]

## 2024-07-26 ENCOUNTER — Other Ambulatory Visit (HOSPITAL_BASED_OUTPATIENT_CLINIC_OR_DEPARTMENT_OTHER): Payer: Self-pay

## 2024-07-26 NOTE — Therapy (Signed)
 OUTPATIENT PHYSICAL THERAPY NEURO TREATMENT    Patient Name: Susan Frederick MRN: [id number] DOB:09/26/1950, 73 y.o., female Today's Date: 07/26/2024   PCP: Samie Frederick, PA-C REFERRING PROVIDER: Debby Olam POUR, NP     END OF SESSION:        Past Medical History:  Diagnosis Date   Arthritis    Cancer (HCC)    GYY,METS WITH OMENTUM   DDD (degenerative disc disease), lumbar    Diverticulosis of colon    Hematuria    Hypertension    Mixed stress and urge urinary incontinence    Obesity    OSA on CPAP    Pre-diabetes    Past Surgical History:  Procedure Laterality Date   ANKLE FUSION WITH GASTROC SLIDE Left 2010   COLONOSCOPY     CYSTOSCOPY N/A 05/02/2024   Procedure: CYSTOSCOPY;  Surgeon: Eldonna Mays, MD;  Location: [facility];  Service: Gynecology;  Laterality: N/A;   HAND TENDON SURGERY     right   HYSTERECTOMY,ROBOT,W/OMENTECT/SALPINGO-OOPHORECT FOR MALIGNANT NEOPLASM Bilateral 05/02/2024   Procedure: ROBOTIC ASSISTED TOTAL LAPAROSCOPIC HYSTERECTOMY WITH BILATERAL SALPINGO-OOPHORECTOMY, MINILAPAROTOMY FOR OMENTECTOMY, PERITONEAL STRIPPING FOR DEBULKING;  Surgeon: Eldonna Mays, MD;  Location: [facility];  Service: Gynecology;  Laterality: Bilateral;  ROBOTIC ASSISTED WITH DEBULKING   IR IMAGING GUIDED PORT INSERTION  01/18/2024   KNEE ARTHROSCOPY     left   NOSE SURGERY     deviated septum   POLYPECTOMY     ROOT CANAL  2025   x2   TOE SURGERY     right foot/ little toe   WISDOM TOOTH EXTRACTION  08/11/1967   Patient Active Problem List   Diagnosis Date Noted   Endometrial cancer (HCC) 05/22/2024   Gynecologic malignancy (HCC) 05/02/2024   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  Rationale for Evaluation and Treatment: Rehabilitation  SUBJECTIVE:                                                                                                                                                                                             SUBJECTIVE STATEMENT: Doing fairly well, forgot to take NSAIDS    Pt accompanied by: self  PERTINENT HISTORY: cancer (see PMH) s/p hysterectomy 05/02/2024 ;neuropathy from chemo, OA bilat knees  PAIN:  Are you having pain? Yes: NPRS scale: 3/10 at rest  Pain location: B knees  Pain description: sore Aggravating factors: in the AM, weather Relieving factors: stretching   PRECAUTIONS: Fall and Other: no lifting >10#, no lifting/straining pushing and pulling for 6 weeks post surgery (05/02/2024)  RED FLAGS: None   WEIGHT BEARING RESTRICTIONS: No  FALLS: Has patient fallen in last 6 months? No  LIVING ENVIRONMENT: Lives with: lives alone Lives in: House/apartment Stairs: 1 step, then 1 step to enter Has following equipment at home: Single point cane, Walker - 2 wheeled, and walking sticks, were sister's equipment  PLOF: Independentshort walks in the neighborhood, enjoyed water  aerobics  PATIENT GOALS: To address the issues with neuropathy  OBJECTIVE:     TODAY'S TREATMENT: 07/27/24 Activity Comments                       TODAY'S TREATMENT: 07/24/24 Activity Comments  NU-step level 3 x 8 min   HEP review 100% recall. Eliminated foot lift-off due to left knee pain  Static multisensory balance Minimal sway  Dynamic standing balance Multidirectional cone taps Retrowalking Obstacle negotiation           TODAY'S TREATMENT: 07/20/24 Activity Comments  LE AROM 20x for warm-up   Gait speed: 2.4 f/sec   DGI 18/24   Static multisensory balance  -upper body weight lifting w/ 5# -weight shifting EO/EC on foam x 60 sec -compliant surfaces w/ postural perturbations -on  foam: EO/EC head movements -standing on incline EO/EC x 30 sec  Gastroc stretch 2x60 sec On slantboard            PATIENT EDUCATION: Education details: Continue current HEP, discussed options for aquatic exercise Person educated: Patient Education method: Explanation Education comprehension: verbalized understanding   HOME EXERCISE PROGRAM Access Code: [email or web address] URL: [email or web address] Date: 07/10/2024 Prepared by: [facility] - Outpatient  Rehab - [facility]  Program Notes perform standing activities at counter for safety  Exercises - Heel Toe Raises with Counter Support  - 1 x daily - 5 x weekly - 2 sets - 10 reps - Standing Gastroc Stretch at Counter  - 1 x daily - 5 x weekly - 2 sets - 30 sec hold - Corner Balance Feet Together With Eyes Closed  - 1 x daily - 7 x weekly - 3 sets - 30 sec hold - Corner Balance Feet Together: Eyes Closed With Head Turns  - 1 x daily - 7 x weekly - 3 sets - 30 sec hold - Semi-Tandem Corner Balance With Eyes Open  - 1 x daily - 7 x weekly - 3 sets - 30 sec hold - Forward Step Touch  - 1 x daily - 7 x weekly - 3 sets - 10 reps  - Side Stepping with Resistance at Thighs and Counter Support  - 7 x weekly - 1-3 sets - 1-2 min round hold - Standing Alternating Knee Flexion with Ankle Weights  - 1 x daily - 5 x weekly - 2 sets - 10 reps - Seated Long Arc Quad with Ankle Weight  - 1 x daily - 5 x weekly - 2 sets - 10 reps - Standing Hip Abduction with Ankle Weight  - 2-3 x weekly - 2-3 sets - 10 reps     Note: Objective measures were completed at Evaluation unless otherwise noted.  DIAGNOSTIC FINDINGS: NA for this PT episode  COGNITION: Overall cognitive status: Within functional limits for tasks assessed   SENSATION: Light touch: Impaired  and distally Reports that balls  of feet forward feel cold  POSTURE: rounded shoulders  LOWER EXTREMITY ROM:   AROM WFL  LOWER EXTREMITY MMT:    MMT Right Eval Left Eval   Hip flexion 4 4  Hip extension    Hip abduction 4 4  Hip adduction 4 4  Hip internal rotation    Hip external rotation    Knee flexion 4 4  Knee extension 4 4  Ankle dorsiflexion 3+ 3+  Ankle plantarflexion    Ankle inversion    Ankle eversion    (Blank rows = not tested)  TRANSFERS: Sit to stand: Modified independence  Assistive device utilized: BUE support     Stand to sit: Modified independence  Assistive device utilized: BUE support      GAIT: Findings: Gait Characteristics: step through pattern and wide BOS, Distance walked: 50 ft, Assistive device utilized:None, and Level of assistance: Modified independence and SBA  FUNCTIONAL TESTS: Timed up and go (TUG): 9.5 sec 10 meter walk test: 16.72 sec (1.96 ft/sec) DGI:  11/24 (<19/24 indicates increased fall risk)    M-CTSIB  Condition 1: Firm Surface, EO  30 Sec, Mild Sway  Condition 2: Firm Surface, EC 30 Sec, Mild and Moderate Sway  Condition 3: Foam Surface, EO 30 Sec, Mild and Moderate Sway  Condition 4: Foam Surface, EC 30 Sec, Moderate and Severe Sway                                                                                                                                   TREATMENT DATE: 06/07/2024    PATIENT EDUCATION: Education details: Eval results, POC Person educated: Patient Education method: Explanation Education comprehension: verbalized understanding  HOME EXERCISE PROGRAM: TBD  GOALS: Goals reviewed with patient? Yes  SHORT TERM GOALS: Target date: 07/07/2024  Pt will be independent with HEP for improved strength, balance, gait. Baseline: Goal status: MET  2.  Pt will improve gait velocity to at least 2.62 ft/sec for improved gait efficiency and safety. Baseline: 2.2 ft/sec Goal status: NOT MET  3.  MCTSIB Condition 4 to improve to 30 sec mod or less sway for improved balance. Baseline: mild x 30 sec Goal status: MET   LONG TERM GOALS: Target date: 08/04/2024  Pt will be  independent with HEP for improved balance, gait, strength. Baseline:  Goal status: MET 07/24/24  2.  Pt will improve DGI score to at least 20/24 to decrease fall risk. Baseline: 11/24; 18/24 Goal status: IN PROGRESS 07/20/24  3.  Pt will verbalize understanding of fall prevention in home environment. Baseline:  Goal status: IN PROGRESS  ASSESSMENT:  CLINICAL IMPRESSION: Instructed in NU-step exercise for low impact cardiovascular exercise and education in HIIT methods for improved efficiency. HEP review with her program primarily focus being BLE strength and static balance.  Good recall and return demonstrtaion to these activities with some refinements made today as she has been experiencing ongoing left knee  pain.  Mild sway to more challenging multisensory balance activities. Dynamic standing balance with light UE support needed when LLE in stance phase and limited step height/clearance due to knee ROM deficits from OA.  Overall, balance has significantly improved and tolerating activities well. Anticipate next session for D/C assessment and other considerations to prepare for D/C to HEP  OBJECTIVE IMPAIRMENTS: Abnormal gait, decreased balance, decreased mobility, difficulty walking, decreased strength, and impaired sensation.   ACTIVITY LIMITATIONS: transfers and locomotion level  PARTICIPATION LIMITATIONS: community activity and fitness  PERSONAL FACTORS: 3+ comorbidities: see above are also affecting patient's functional outcome.   REHAB POTENTIAL: Good  CLINICAL DECISION MAKING: Stable/uncomplicated  EVALUATION COMPLEXITY: Low  PLAN:  PT FREQUENCY: 2x/week  PT DURATION: 8 weeks plus eval  PLANNED INTERVENTIONS: 97750- Physical Performance Testing, 97110-Therapeutic exercises, 97530- Therapeutic activity, [id number]- Neuromuscular re-education, 97535- Self Care, 02859- Manual therapy, [phone]- Gait training, Patient/Family education, and Balance training  PLAN FOR NEXT SESSION:   D/C assessment    7:59 AM, 07/26/2024 M. Kelly Halpin, PT, DPT Physical Therapist- [place] Office Number: [phone]

## 2024-07-27 ENCOUNTER — Ambulatory Visit: Admitting: Physical Therapy

## 2024-07-27 ENCOUNTER — Encounter: Payer: Self-pay | Admitting: Physical Therapy

## 2024-07-27 DIAGNOSIS — R2689 Other abnormalities of gait and mobility: Secondary | ICD-10-CM

## 2024-07-27 DIAGNOSIS — R2681 Unsteadiness on feet: Secondary | ICD-10-CM

## 2024-07-27 DIAGNOSIS — M6281 Muscle weakness (generalized): Secondary | ICD-10-CM

## 2024-07-27 NOTE — Patient Instructions (Signed)

## 2024-07-31 ENCOUNTER — Ambulatory Visit: Admitting: Physical Therapy

## 2024-08-02 ENCOUNTER — Ambulatory Visit

## 2024-08-07 ENCOUNTER — Encounter: Payer: Self-pay | Admitting: *Deleted

## 2024-08-21 ENCOUNTER — Encounter: Payer: Self-pay | Admitting: Nurse Practitioner

## 2024-08-21 ENCOUNTER — Inpatient Hospital Stay: Attending: Physician Assistant | Admitting: Nurse Practitioner

## 2024-08-21 ENCOUNTER — Inpatient Hospital Stay: Attending: Physician Assistant

## 2024-08-21 VITALS — BP 124/56 | HR 71 | Temp 97.7°F | Resp 17 | Wt 294.2 lb

## 2024-08-21 DIAGNOSIS — G629 Polyneuropathy, unspecified: Secondary | ICD-10-CM | POA: Diagnosis not present

## 2024-08-21 DIAGNOSIS — C541 Malignant neoplasm of endometrium: Secondary | ICD-10-CM | POA: Diagnosis present

## 2024-08-21 DIAGNOSIS — R630 Anorexia: Secondary | ICD-10-CM | POA: Insufficient documentation

## 2024-08-21 DIAGNOSIS — C786 Secondary malignant neoplasm of retroperitoneum and peritoneum: Secondary | ICD-10-CM | POA: Insufficient documentation

## 2024-08-21 DIAGNOSIS — I10 Essential (primary) hypertension: Secondary | ICD-10-CM | POA: Insufficient documentation

## 2024-08-21 DIAGNOSIS — M1990 Unspecified osteoarthritis, unspecified site: Secondary | ICD-10-CM | POA: Insufficient documentation

## 2024-08-21 DIAGNOSIS — C5700 Malignant neoplasm of unspecified fallopian tube: Secondary | ICD-10-CM | POA: Diagnosis not present

## 2024-08-21 DIAGNOSIS — D696 Thrombocytopenia, unspecified: Secondary | ICD-10-CM | POA: Insufficient documentation

## 2024-08-21 LAB — CBC WITH DIFFERENTIAL (CANCER CENTER ONLY)
Abs Immature Granulocytes: 0.01 K/uL (ref 0.00–0.07)
Basophils Absolute: 0 K/uL (ref 0.0–0.1)
Basophils Relative: 0 %
Eosinophils Absolute: 0 K/uL (ref 0.0–0.5)
Eosinophils Relative: 1 %
HCT: 31.1 % — ABNORMAL LOW (ref 36.0–46.0)
Hemoglobin: 10.7 g/dL — ABNORMAL LOW (ref 12.0–15.0)
Immature Granulocytes: 0 %
Lymphocytes Relative: 24 %
Lymphs Abs: 1.1 K/uL (ref 0.7–4.0)
MCH: 33.6 pg (ref 26.0–34.0)
MCHC: 34.4 g/dL (ref 30.0–36.0)
MCV: 97.8 fL (ref 80.0–100.0)
Monocytes Absolute: 0.3 K/uL (ref 0.1–1.0)
Monocytes Relative: 7 %
Neutro Abs: 3 K/uL (ref 1.7–7.7)
Neutrophils Relative %: 68 %
Platelet Count: 126 K/uL — ABNORMAL LOW (ref 150–400)
RBC: 3.18 MIL/uL — ABNORMAL LOW (ref 3.87–5.11)
RDW: 14.8 % (ref 11.5–15.5)
WBC Count: 4.5 K/uL (ref 4.0–10.5)
nRBC: 0 % (ref 0.0–0.2)

## 2024-08-21 LAB — CMP (CANCER CENTER ONLY)
ALT: 13 U/L (ref 0–44)
AST: 17 U/L (ref 15–41)
Albumin: 4.1 g/dL (ref 3.5–5.0)
Alkaline Phosphatase: 116 U/L (ref 38–126)
Anion gap: 12 (ref 5–15)
BUN: 20 mg/dL (ref 8–23)
CO2: 25 mmol/L (ref 22–32)
Calcium: 10 mg/dL (ref 8.9–10.3)
Chloride: 102 mmol/L (ref 98–111)
Creatinine: 0.69 mg/dL (ref 0.44–1.00)
GFR, Estimated: >60 mL/min
Glucose, Bld: 123 mg/dL — ABNORMAL HIGH (ref 70–99)
Potassium: 4 mmol/L (ref 3.5–5.1)
Sodium: 138 mmol/L (ref 135–145)
Total Bilirubin: 0.4 mg/dL (ref 0.0–1.2)
Total Protein: 7.1 g/dL (ref 6.5–8.1)

## 2024-08-21 NOTE — Progress Notes (Signed)
 " [facility] OFFICE PROGRESS NOTE   Diagnosis: Fallopian tube carcinoma   INTERVAL HISTORY:   [name] returns as scheduled.  She completed a final cycle of carboplatin  chemotherapy 07/12/2024.  She denies nausea/vomiting.  No mouth sores.  Bowels moving fairly regularly with MiraLAX .  She has had intermittent low abdominal pain since the surgery.  She typically notices the pain when she coughs or blows her nose.  She has mild neuropathy symptoms mainly in the toes, none in the hands.  She describes symptoms as sporadic.  No associated pain.  She has a good appetite.  Objective:  Vital signs in last 24 hours:  Blood pressure (!) 124/56, pulse 71, temperature 97.7 F (36.5 C), temperature source Temporal, resp. rate 17, weight 294 lb 3.2 oz (133.4 kg), SpO2 100%.    HEENT: No thrush or ulcers. Lymphatics: No palpable cervical, supraclavicular, axillary or inguinal lymph nodes. Resp: Lungs clear bilaterally. Cardio: Regular rate and rhythm. GI: Generalized mild tenderness across the abdomen.  No hepatosplenomegaly.  No mass. Vascular: No leg edema. Skin: Palms without erythema. Port-A-Cath without erythema.  Lab Results:  Lab Results  Component Value Date   WBC 4.5 08/21/2024   HGB 10.7 (L) 08/21/2024   HCT 31.1 (L) 08/21/2024   MCV 97.8 08/21/2024   PLT 126 (L) 08/21/2024   NEUTROABS 3.0 08/21/2024    Imaging:  No results found.  Medications: I have reviewed the patient's current medications.  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)-fallopian tube 12/27/2023: CT abdomen/pelvis-extensive abnormal diffuse stranding and nodularity of the omentum and mesentery, small volume ascites, small periumbilical hernia with soft tissue nodularity, enlarged aortocaval retroperitoneal node 01/05/2024: PET-hypermetabolic lymph nodes in the mediastinum, bilateral internal mammary, and right pericardial areas, diffuse carcinomatosis, small  hypermetabolic retroperitoneal nodes, no primary lesion identified 01/18/2024: CT-guided biopsy of left peritoneal tumor-metastatic high-grade serous carcinoma, PAX8 and WT1 positive Elevated CA125 Cycle 1 paclitaxel /carboplatin  02/02/2024, Udenyca  Cycle 2 paclitaxel /carboplatin  02/23/2024, Udenyca  Cycle 3 paclitaxel /carboplatin  03/15/2024, chemotherapy dose reduced due to thrombocytopenia and arthralgias; Udenyca  03/28/2024 CTs: No enlarged mediastinal nodes, no ascites, mild diffuse peritoneal thickening and nodularity-significantly improved, low-attenuation anterior liver lesion unchanged, new 3 mm left lower lobe nodule Cycle 4 paclitaxel /carboplatin  04/05/2024, Udenyca  05/02/2024 hysterectomy, bilateral salpingo-oophorectomy, radical dissection for tumor debulking with peritoneal stripping, mini laparotomy for omentectomy, cystoscopy (overall nearly complete gross resection with possible few scattered areas of miliary disease on the bowel mesentery and right diaphragm); pathology with 2 separate tumors: Endometrioid carcinoma, FIGO grade 1, 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; high-grade serous carcinoma involving left fallopian tube; endometrial carcinoma shows no evidence of myometrial invasion; focal tumor deposits seen on surface of the right fallopian tube; bilateral ovaries show evidence of psammomatous calcifications but no carcinoma identified; no evidence of lymphovascular invasion; benign unremarkable cervix; bladder peritoneum/right inferior paracolic gutter peritoneum/left inferior paracolic gutter peritoneum/omentum involved by high-grade serous carcinoma with associated psammomatous calcifications; jejunal nodule psammomatous calcifications Cycle 5 carboplatin  05/31/2024, Taxol  held due to neuropathy Cycle 6 carboplatin  06/21/2024, carboplatin  dose reduced due to thrombocytopenia, Taxol  held due to neuropathy   05/02/2024 endometrial carcinoma, FIGO grade 1, approximately 4.4  cm, confined to an endometrial polyp, no evidence of myometrial invasion  Abdominal pain secondary to 1 Anorexia/weight loss secondary to #1 Hypertension Arthritis Benign endocervix polyp 2019 Family history of breast, endometrial, lung, and head neck cancer-negative genetic testing 02/17/2024    Disposition: Susan Frederick appears stable.  She is currently being followed with observation.  There is no clinical evidence of disease progression.  We will follow-up on the CA125 from today.  Plan for surveillance CT scans in approximately 6 weeks.  She will return for follow-up a few days after CT scans.  We are available to see her sooner if needed.    Olam Ned ANP/GNP-BC   08/21/2024  1:45 PM        "

## 2024-08-21 NOTE — Patient Instructions (Signed)

## 2024-08-22 ENCOUNTER — Other Ambulatory Visit: Payer: Self-pay

## 2024-08-22 LAB — CA 125: Cancer Antigen (CA) 125: 18.2 U/mL (ref 0.0–38.1)

## 2024-08-30 ENCOUNTER — Telehealth: Payer: Self-pay

## 2024-08-30 ENCOUNTER — Other Ambulatory Visit: Payer: Self-pay

## 2024-08-30 NOTE — Telephone Encounter (Signed)
 Attempted to reach patient via phone, unable to reach. Sent patient mychart message in regards to CT Abdominal Pelvis with Contrast scheduled for 10/12/24 at 1 pm [facility] location.

## 2024-10-02 ENCOUNTER — Inpatient Hospital Stay

## 2024-10-04 ENCOUNTER — Inpatient Hospital Stay: Admitting: Oncology

## 2024-10-12 ENCOUNTER — Other Ambulatory Visit (HOSPITAL_BASED_OUTPATIENT_CLINIC_OR_DEPARTMENT_OTHER)

## 2024-10-12 ENCOUNTER — Inpatient Hospital Stay

## 2024-10-17 ENCOUNTER — Inpatient Hospital Stay: Admitting: Oncology
